# Patient Record
Sex: Female | Born: 1980 | Race: Black or African American | Hispanic: No | Marital: Married | Smoking: Never smoker
Health system: Southern US, Community
[De-identification: ages and names within clinical notes are randomized; demographics above are authoritative.]

## PROBLEM LIST (undated history)

## (undated) DIAGNOSIS — K219 Gastro-esophageal reflux disease without esophagitis: Secondary | ICD-10-CM

## (undated) DIAGNOSIS — F419 Anxiety disorder, unspecified: Secondary | ICD-10-CM

## (undated) DIAGNOSIS — G43109 Migraine with aura, not intractable, without status migrainosus: Secondary | ICD-10-CM

## (undated) DIAGNOSIS — N200 Calculus of kidney: Secondary | ICD-10-CM

## (undated) DIAGNOSIS — Z87442 Personal history of urinary calculi: Secondary | ICD-10-CM

## (undated) HISTORY — PX: DILATION AND CURETTAGE OF UTERUS: SHX78

## (undated) HISTORY — DX: Migraine with aura, not intractable, without status migrainosus: G43.109

---

## 2000-05-30 ENCOUNTER — Other Ambulatory Visit: Admission: RE | Admit: 2000-05-30 | Discharge: 2000-05-30 | Payer: Self-pay | Admitting: Obstetrics and Gynecology

## 2000-06-07 ENCOUNTER — Ambulatory Visit (HOSPITAL_COMMUNITY): Admission: RE | Admit: 2000-06-07 | Discharge: 2000-06-07 | Payer: Self-pay | Admitting: Internal Medicine

## 2000-09-11 ENCOUNTER — Emergency Department (HOSPITAL_COMMUNITY): Admission: EM | Admit: 2000-09-11 | Discharge: 2000-09-11 | Payer: Self-pay | Admitting: Emergency Medicine

## 2000-10-31 ENCOUNTER — Ambulatory Visit (HOSPITAL_COMMUNITY): Admission: RE | Admit: 2000-10-31 | Discharge: 2000-10-31 | Payer: Self-pay | Admitting: Obstetrics and Gynecology

## 2001-02-18 ENCOUNTER — Other Ambulatory Visit: Admission: RE | Admit: 2001-02-18 | Discharge: 2001-02-18 | Payer: Self-pay | Admitting: Obstetrics and Gynecology

## 2001-02-21 ENCOUNTER — Ambulatory Visit (HOSPITAL_COMMUNITY): Admission: RE | Admit: 2001-02-21 | Discharge: 2001-02-21 | Payer: Self-pay | Admitting: Obstetrics and Gynecology

## 2001-11-16 ENCOUNTER — Emergency Department (HOSPITAL_COMMUNITY): Admission: EM | Admit: 2001-11-16 | Discharge: 2001-11-16 | Payer: Self-pay | Admitting: Emergency Medicine

## 2002-04-18 ENCOUNTER — Emergency Department (HOSPITAL_COMMUNITY): Admission: EM | Admit: 2002-04-18 | Discharge: 2002-04-18 | Payer: Self-pay | Admitting: Emergency Medicine

## 2003-02-24 ENCOUNTER — Ambulatory Visit (HOSPITAL_COMMUNITY): Admission: AD | Admit: 2003-02-24 | Discharge: 2003-02-24 | Payer: Self-pay | Admitting: Obstetrics and Gynecology

## 2003-04-11 ENCOUNTER — Ambulatory Visit (HOSPITAL_COMMUNITY): Admission: RE | Admit: 2003-04-11 | Discharge: 2003-04-11 | Payer: Self-pay | Admitting: Obstetrics and Gynecology

## 2003-05-21 ENCOUNTER — Inpatient Hospital Stay (HOSPITAL_COMMUNITY): Admission: AD | Admit: 2003-05-21 | Discharge: 2003-05-24 | Payer: Self-pay | Admitting: Obstetrics and Gynecology

## 2003-09-07 ENCOUNTER — Emergency Department (HOSPITAL_COMMUNITY): Admission: EM | Admit: 2003-09-07 | Discharge: 2003-09-07 | Payer: Self-pay | Admitting: Emergency Medicine

## 2004-11-21 ENCOUNTER — Emergency Department (HOSPITAL_COMMUNITY): Admission: EM | Admit: 2004-11-21 | Discharge: 2004-11-21 | Payer: Self-pay | Admitting: Emergency Medicine

## 2005-07-21 ENCOUNTER — Ambulatory Visit (HOSPITAL_COMMUNITY): Admission: RE | Admit: 2005-07-21 | Discharge: 2005-07-21 | Payer: Self-pay | Admitting: Family Medicine

## 2005-07-21 IMAGING — CT CT HEAD W/O CM
1 series · 16 of 30 positions shown, 20 images · IV contrast (agent unspecified)
Comparison: none

HISTORY: Headaches

CT HEAD WITHOUT CONTRAST:
Routine noncontrast CT head without priors for comparison.
Normal ventricular morphology.
No midline shift or mass-effect.
Normal appearance of brain parenchyma.
No intracranial mass, hemorrhage, or infarction.
Visualized sinuses clear.
Bones unremarkable.

[Series 5169: — · axial · 0.49mm/px · z∈[-678,-542]mm · 16 of 30 slices shown, 20 images]
[im 2/30  brain]
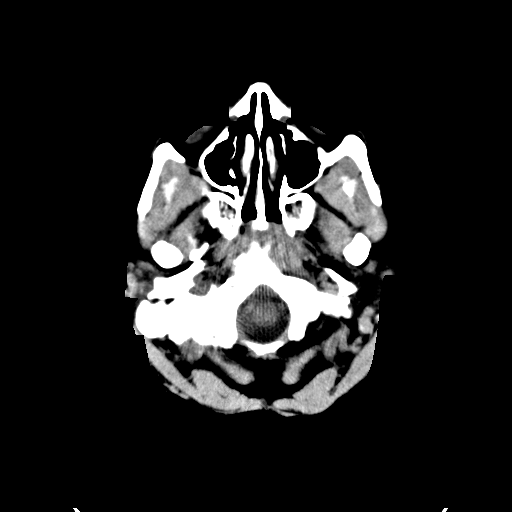
[im 2/30  bone]
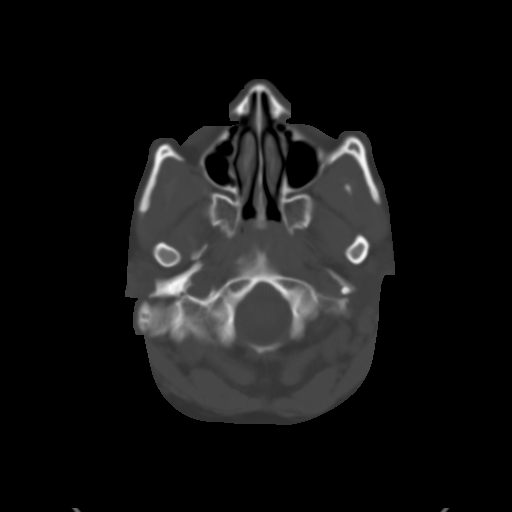
[im 4/30  brain]
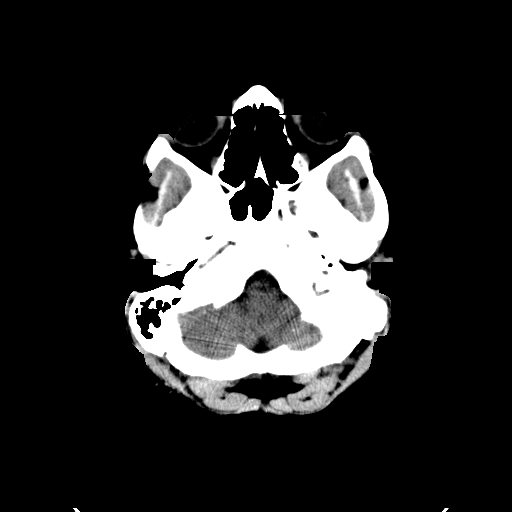
[im 6/30  brain]
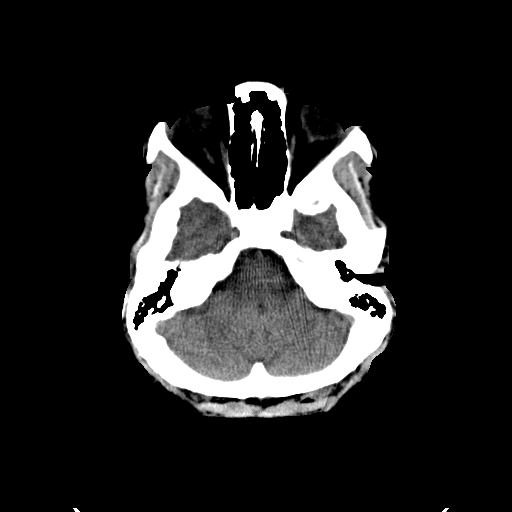
[im 8/30  brain]
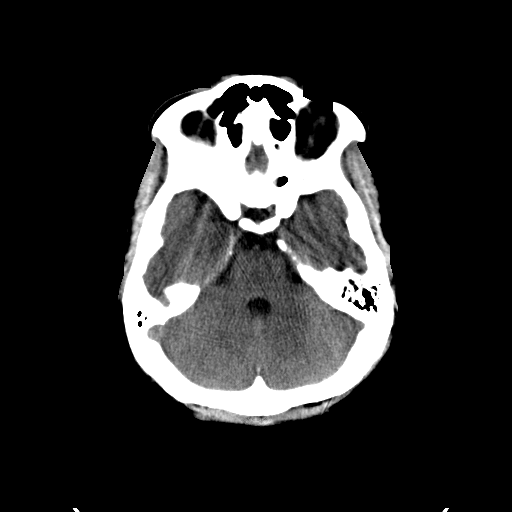
[im 9/30  brain]
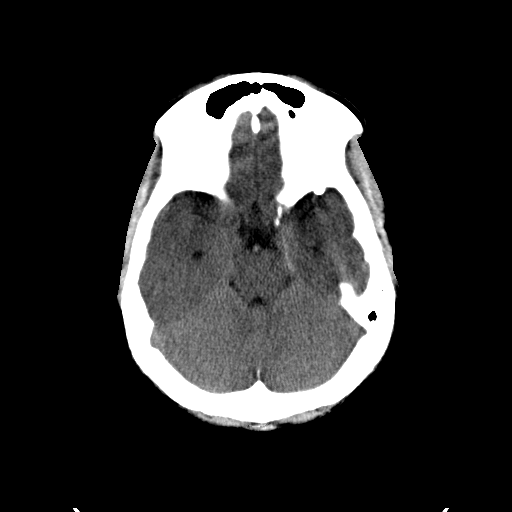
[im 9/30  bone]
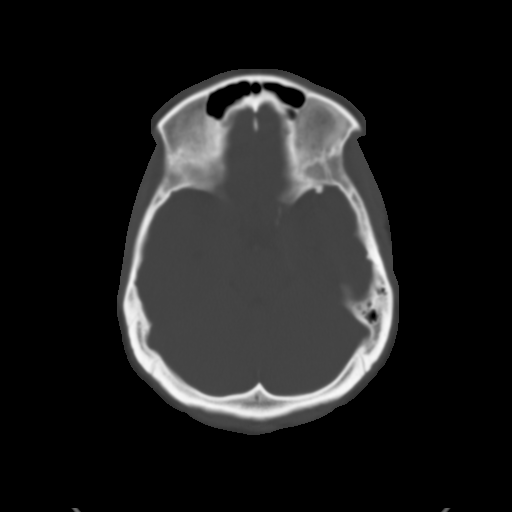
[im 11/30  brain]
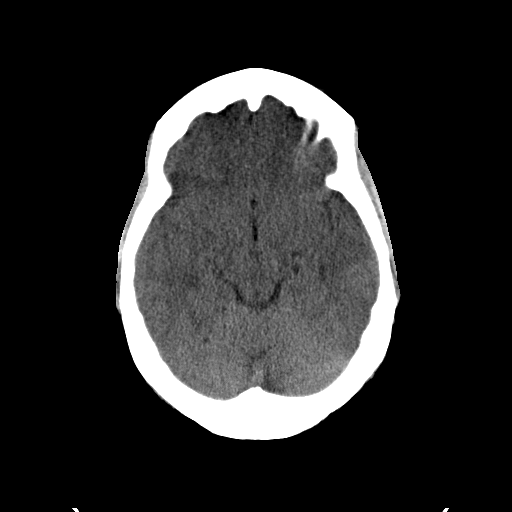
[im 13/30  brain]
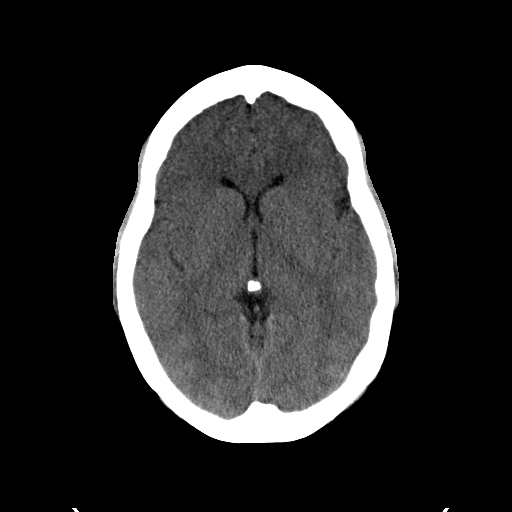
[im 15/30  brain]
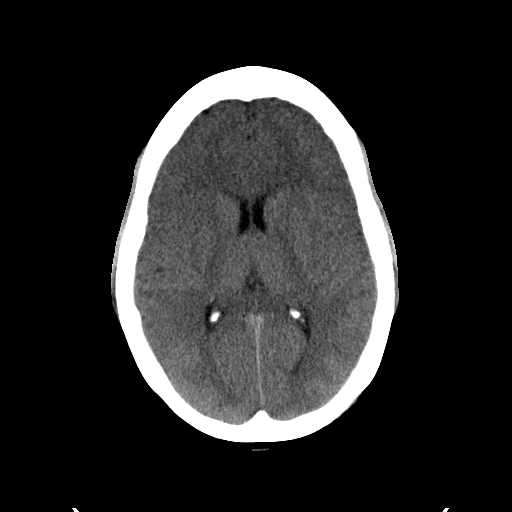
[im 16/30  brain]
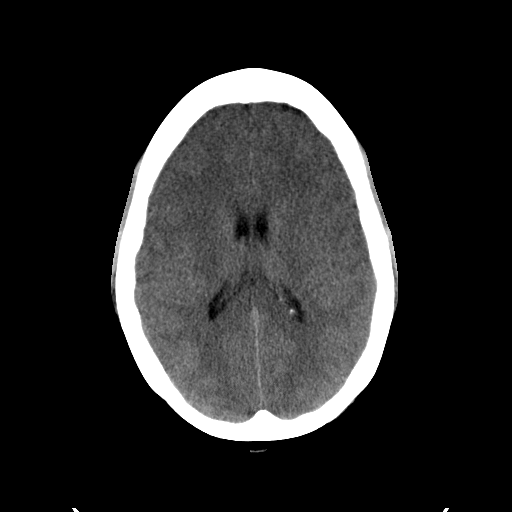
[im 16/30  bone]
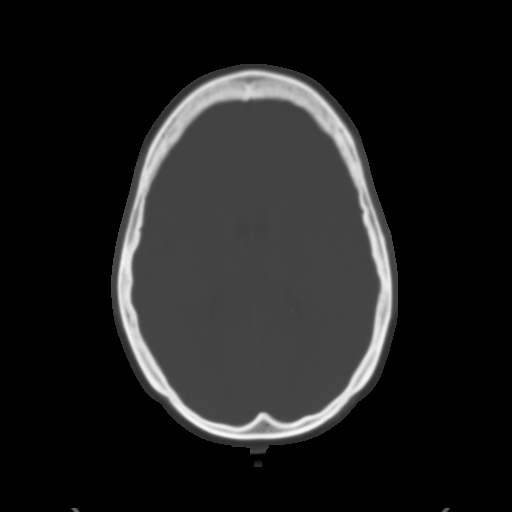
[im 18/30  brain]
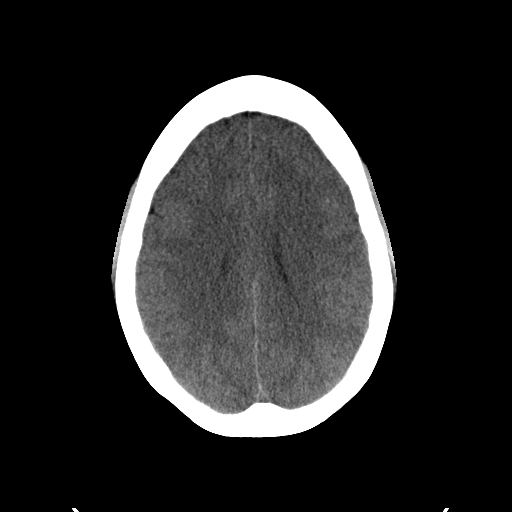
[im 20/30  brain]
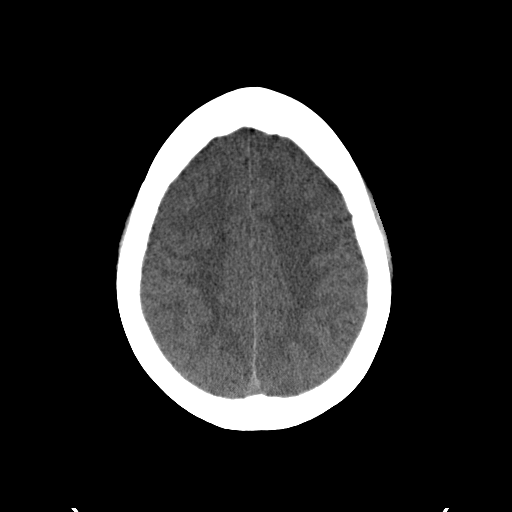
[im 22/30  brain]
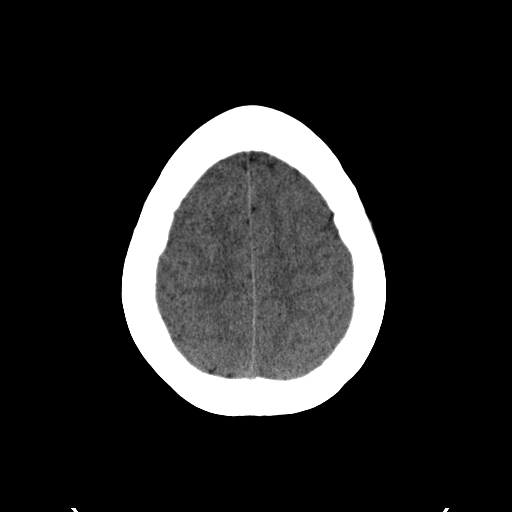
[im 23/30  brain]
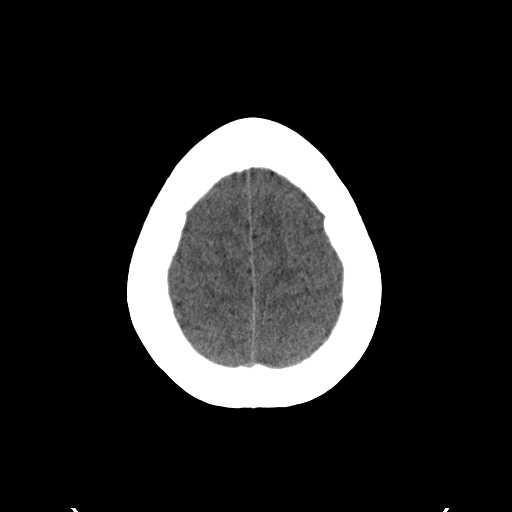
[im 23/30  bone]
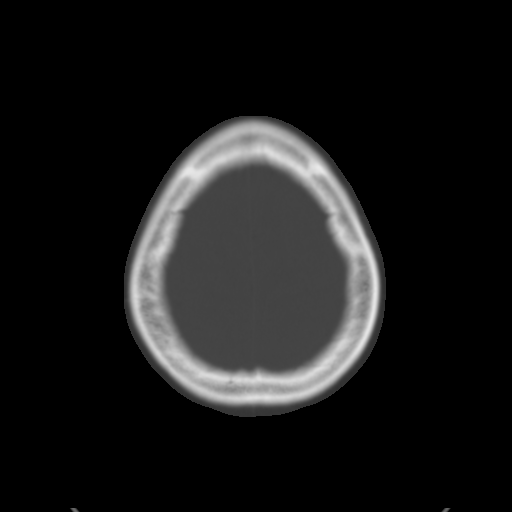
[im 25/30  brain]
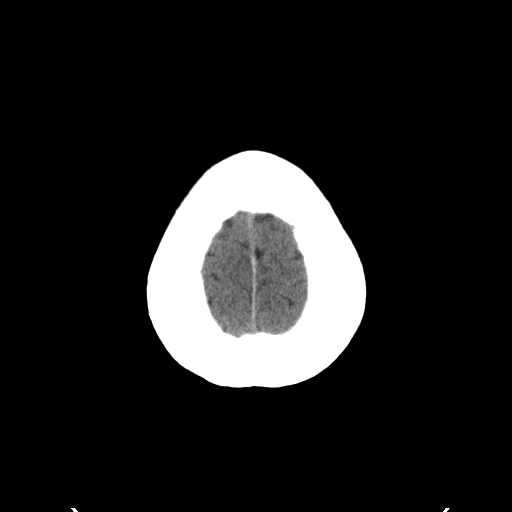
[im 27/30  brain]
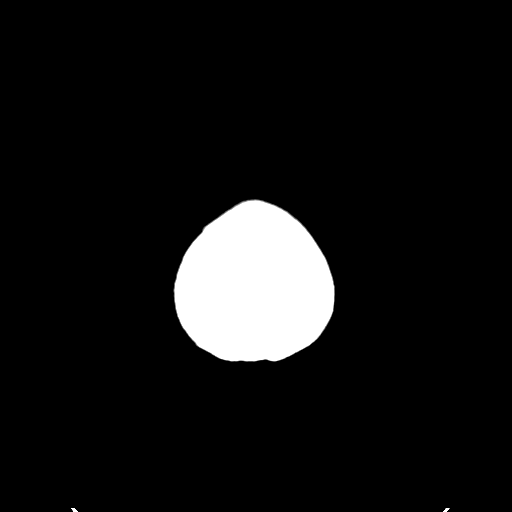
[im 29/30  brain]
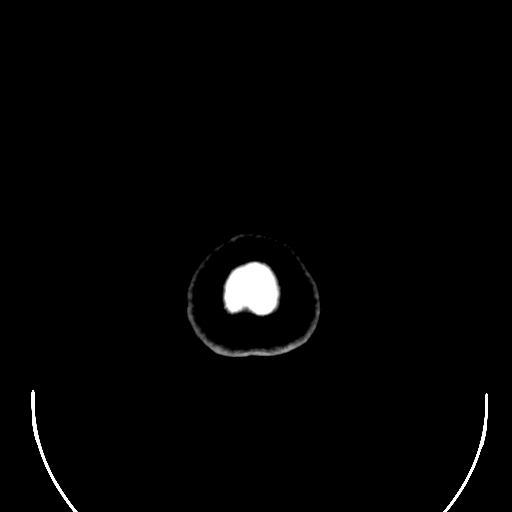

[16 of 30 positions shown; findings below may reference images not displayed]

IMPRESSION: No acute intracranial abnormality.

## 2005-07-22 ENCOUNTER — Emergency Department (HOSPITAL_COMMUNITY): Admission: EM | Admit: 2005-07-22 | Discharge: 2005-07-22 | Payer: Self-pay | Admitting: Emergency Medicine

## 2006-11-03 ENCOUNTER — Emergency Department (HOSPITAL_COMMUNITY): Admission: EM | Admit: 2006-11-03 | Discharge: 2006-11-03 | Payer: Self-pay | Admitting: Emergency Medicine

## 2006-11-05 ENCOUNTER — Other Ambulatory Visit: Admission: RE | Admit: 2006-11-05 | Discharge: 2006-11-05 | Payer: Self-pay | Admitting: Obstetrics and Gynecology

## 2006-12-12 ENCOUNTER — Emergency Department (HOSPITAL_COMMUNITY): Admission: EM | Admit: 2006-12-12 | Discharge: 2006-12-12 | Payer: Self-pay | Admitting: *Deleted

## 2008-01-08 ENCOUNTER — Emergency Department (HOSPITAL_COMMUNITY): Admission: EM | Admit: 2008-01-08 | Discharge: 2008-01-08 | Payer: Self-pay | Admitting: Emergency Medicine

## 2008-01-08 IMAGING — CT CT PELVIS W/O CM
1 of 2 series · 15 of 32 positions shown, 19 images · non-contrast
Comparison: None

CT ABDOMEN

CLINICAL DATA: Left flank pain with nausea and vomiting.

CT ABDOMEN AND PELVIS WITHOUT CONTRAST
TECHNIQUE: Multidetector CT imaging of the abdomen and pelvis was
performed following the standard protocol without intravenous
contrast.

[Series 2: stone 5.0 b40f · axial · 0.66mm/px · z∈[-493,-28]mm · 15 of 101 slices shown, 19 images]
[im 4/101  soft-tissue]
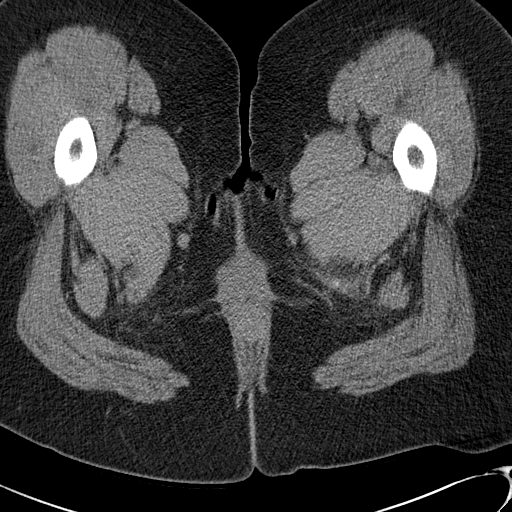
[im 4/101  bone]
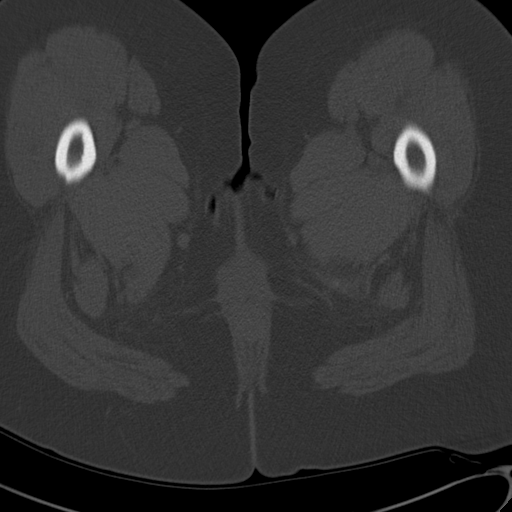
[im 12/101  soft-tissue]
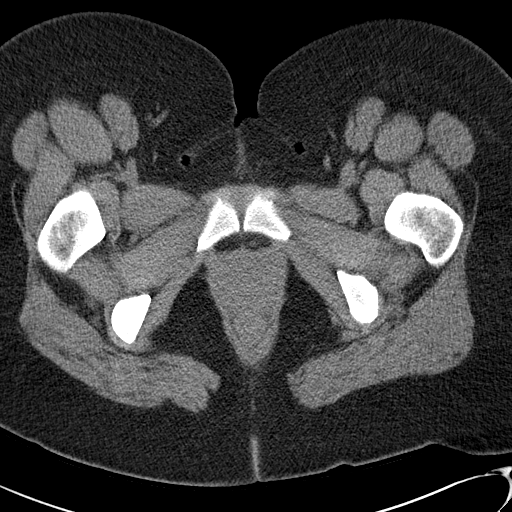
[im 20/101  soft-tissue]
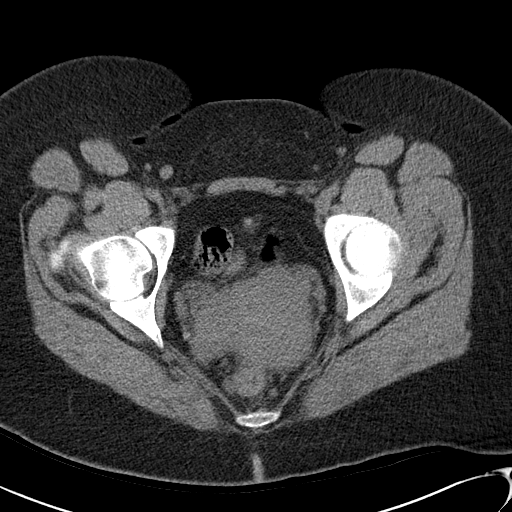
[im 27/101  soft-tissue]
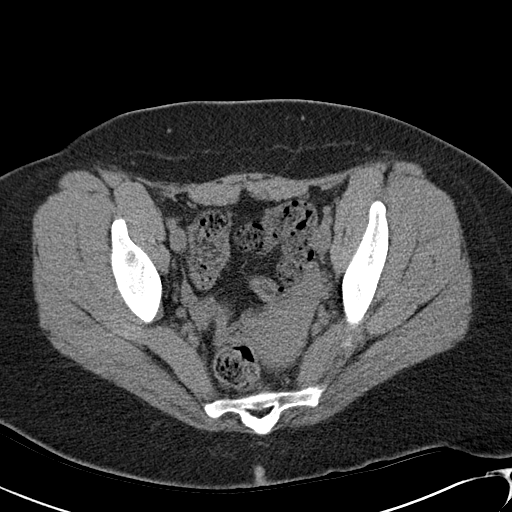
[im 35/101  soft-tissue]
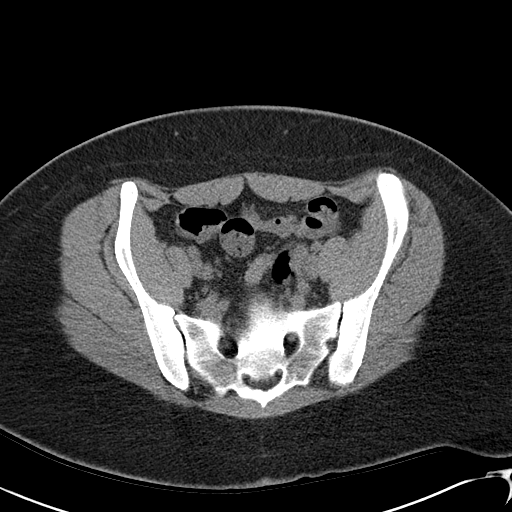
[im 43/101  soft-tissue]
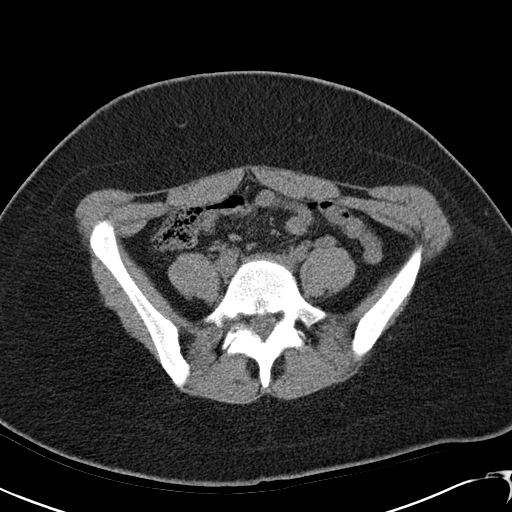
[im 51/101  soft-tissue]
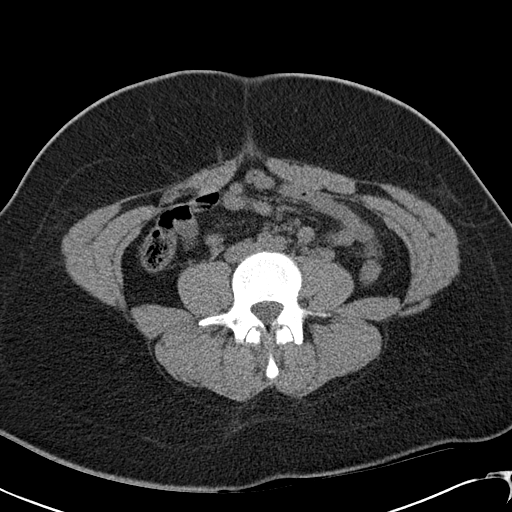
[im 58/101  soft-tissue]
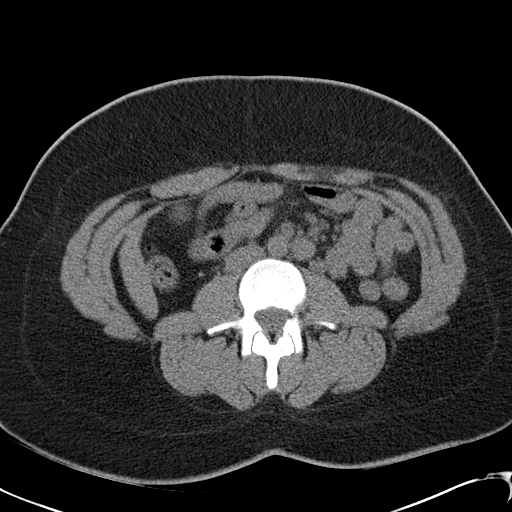
[im 66/101  soft-tissue]
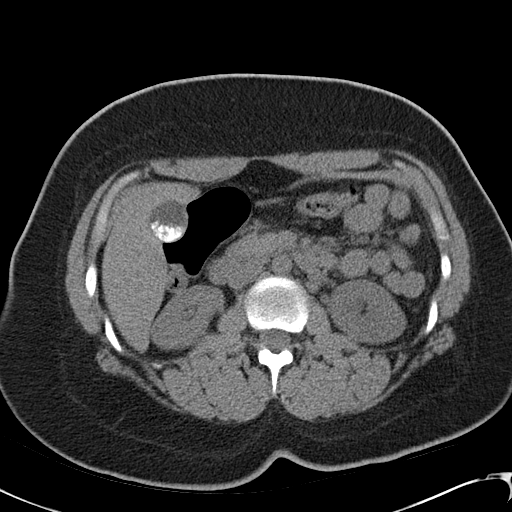
[im 66/101  bone]
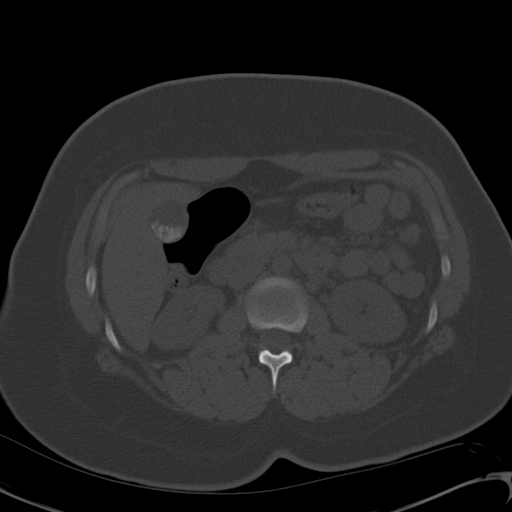
[im 74/101  soft-tissue]
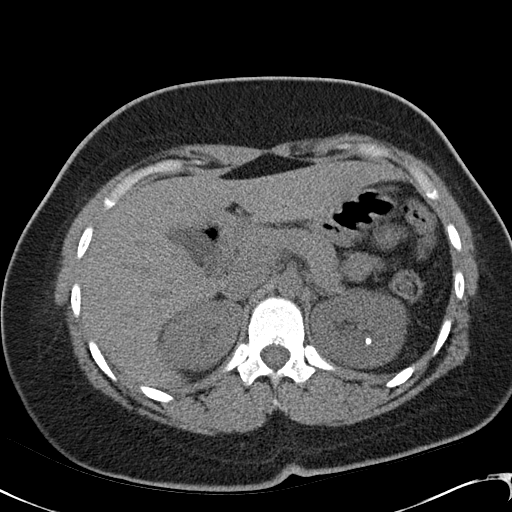
[im 81/101  soft-tissue]
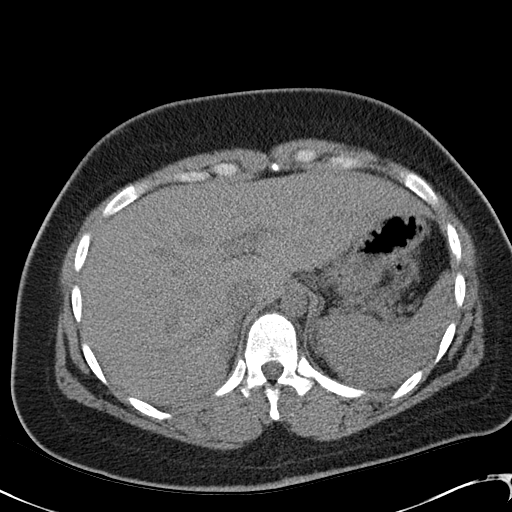
[im 85/101  lung]
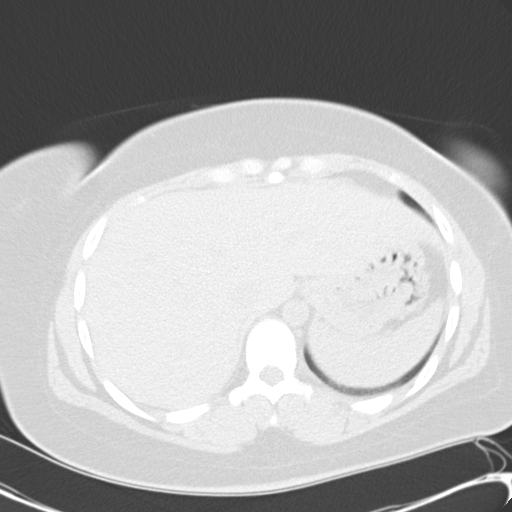
[im 89/101  soft-tissue]
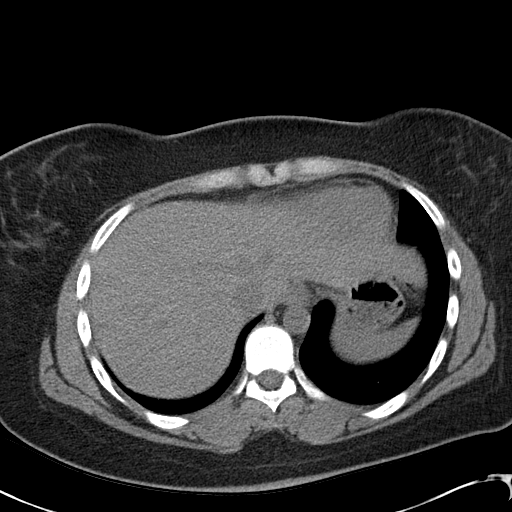
[im 89/101  lung]
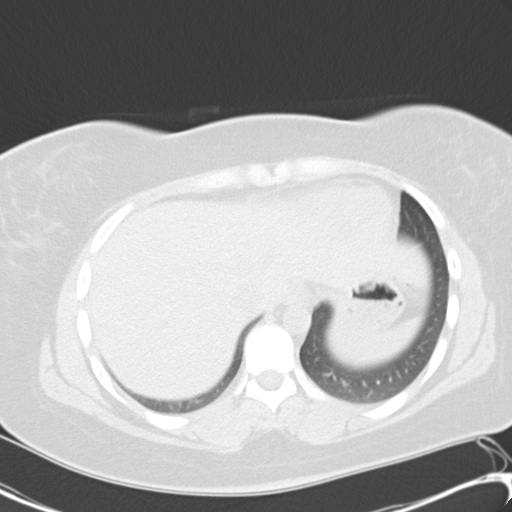
[im 93/101  lung]
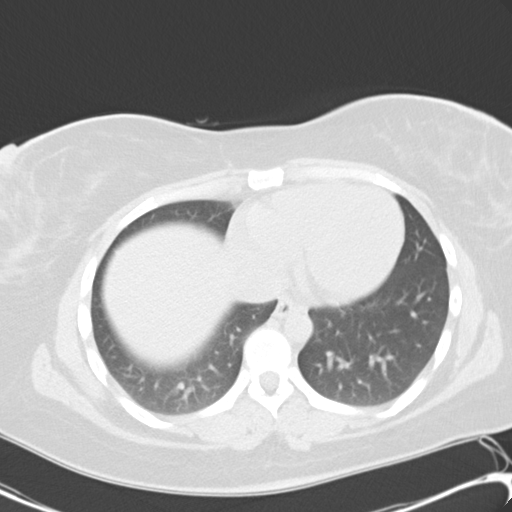
[im 97/101  soft-tissue]
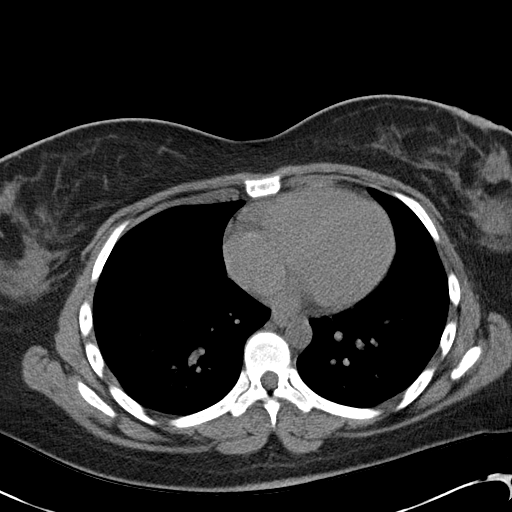
[im 97/101  lung]
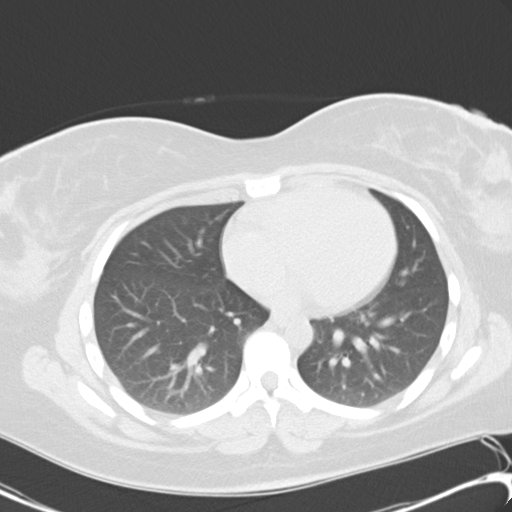

[15 of 32 positions shown; findings below may reference images not displayed]

FINDINGS: Lung bases are clear.  Heart size normal.  No
pericardial or pleural effusion.

Liver unremarkable.  Several stones layer in the gallbladder.
Adrenal glands, right kidney and right ureter are unremarkable.  A
stone in the upper pole of the left kidney measures 4 mm.  Left
ureter is mildly prominent, but a ureteral stone is not identified.
Very mild left perinephric stranding.  Spleen, pancreas, stomach
and small bowel are unremarkable.  No pathologically enlarged lymph
nodes.
IMPRESSION: 1.  Mild left perinephric stranding and left ureteral prominence
suggest the possibility of recent stone passage.
2.  Left nephrolithiasis.
3.  Cholelithiasis.

CT PELVIS
FINDINGS: A tiny calcification in the lateral left anatomic pelvis
is felt to be a phlebolith.  Colon and appendix are unremarkable.
Bladder is relatively decompressed.  No pathologically enlarged
lymph nodes.  No free fluid.  Uterus and ovaries are visualized.
No worrisome lytic or sclerotic lesions.
IMPRESSION: No acute findings in the pelvis.

## 2008-01-26 ENCOUNTER — Emergency Department (HOSPITAL_COMMUNITY): Admission: EM | Admit: 2008-01-26 | Discharge: 2008-01-26 | Payer: Self-pay | Admitting: Emergency Medicine

## 2008-01-28 ENCOUNTER — Other Ambulatory Visit: Admission: RE | Admit: 2008-01-28 | Discharge: 2008-01-28 | Payer: Self-pay | Admitting: Obstetrics & Gynecology

## 2009-03-05 ENCOUNTER — Emergency Department (HOSPITAL_COMMUNITY): Admission: EM | Admit: 2009-03-05 | Discharge: 2009-03-05 | Payer: Self-pay | Admitting: Emergency Medicine

## 2009-03-05 IMAGING — US US OB COMP LESS 14 WK
1 series · 14 of 28 positions shown · non-contrast
Comparison: None.

CLINICAL DATA: 28-year-old with LMP approximately [DATE] (9
weeks 6 days), presenting with pelvic pain. Quantitative beta HCG
[DATE].

OBSTETRIC <14 WK ULTRASOUND [DATE]:
TECHNIQUE: Transabdominal ultrasound was performed for evaluation
of the gestation as well as the maternal uterus and adnexal
regions.

[Series 1: us ob comp less 14 wk · 0.30mm/px · 14 of 43 slices shown]
[im 2/43]
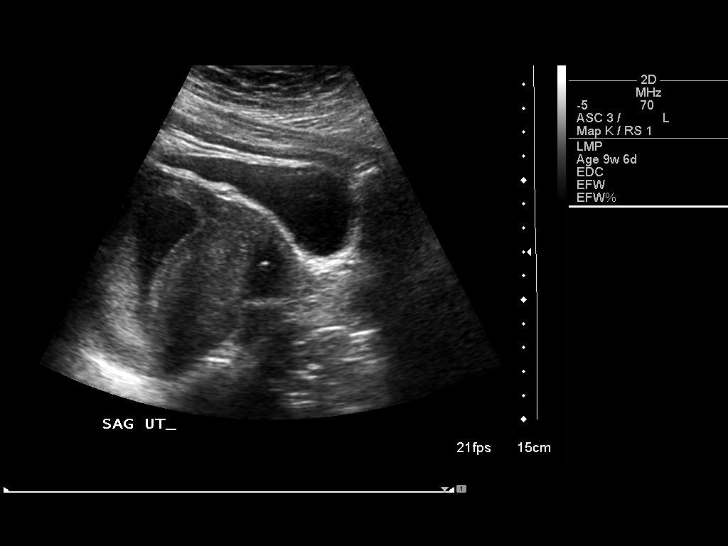
[im 5/43]
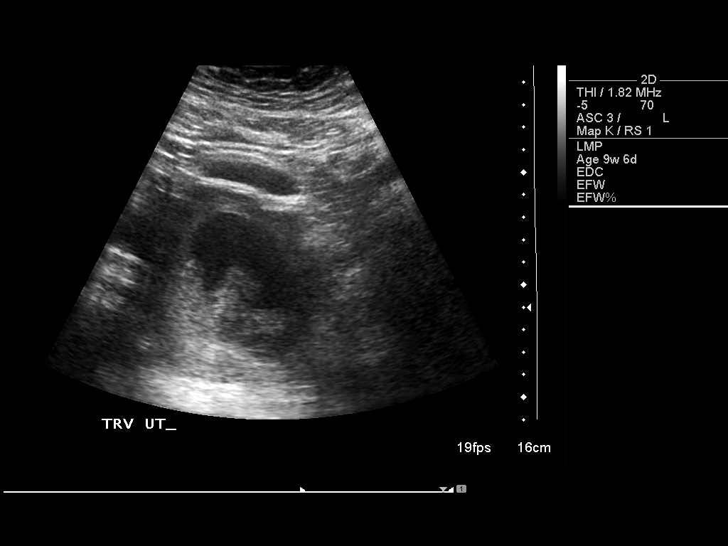
[im 8/43]
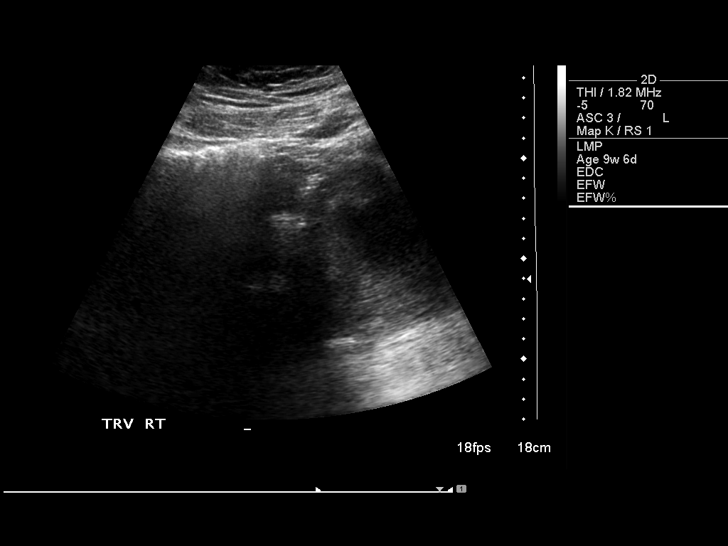
[im 11/43]
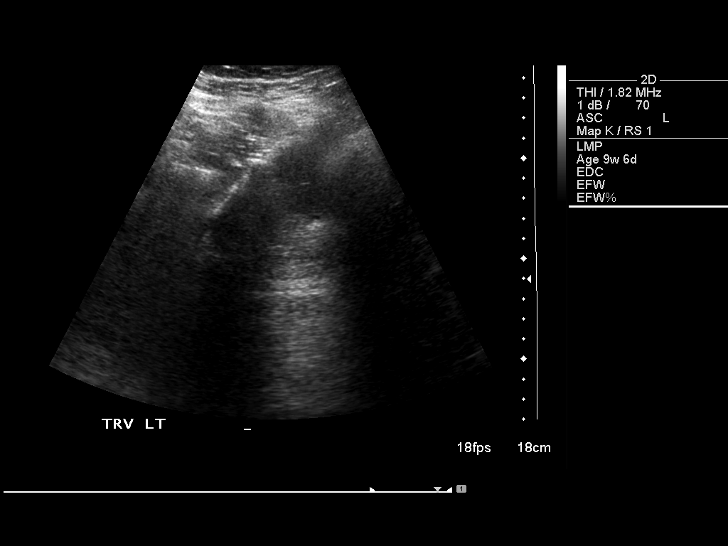
[im 15/43]
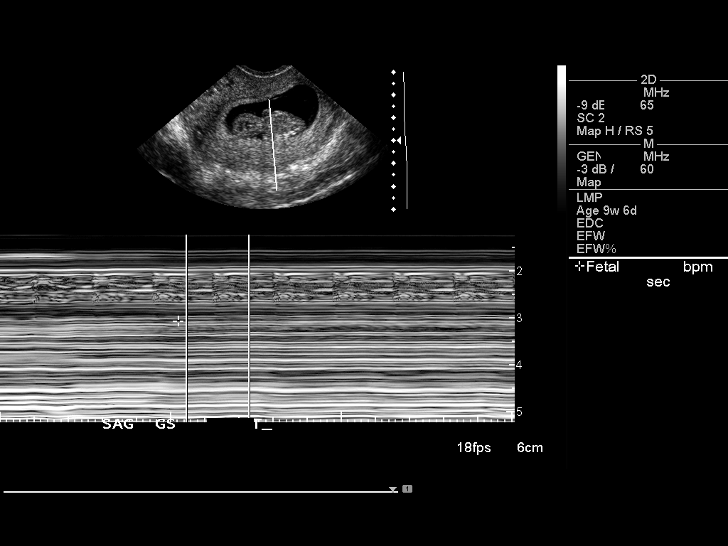
[im 18/43]
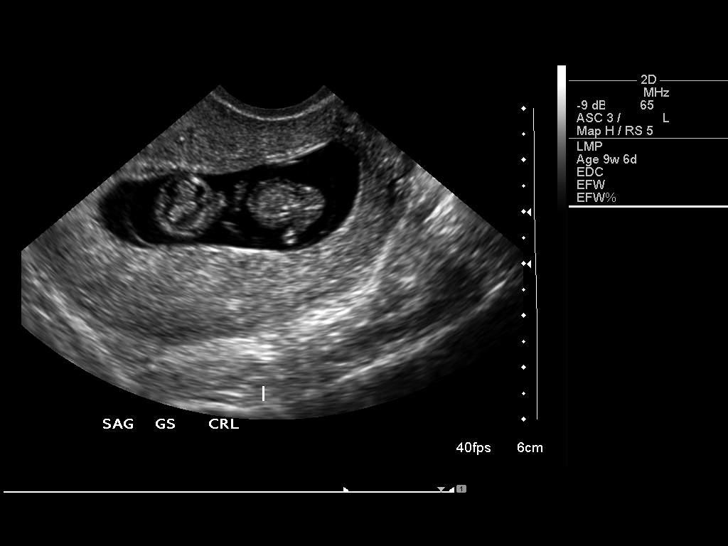
[im 21/43]
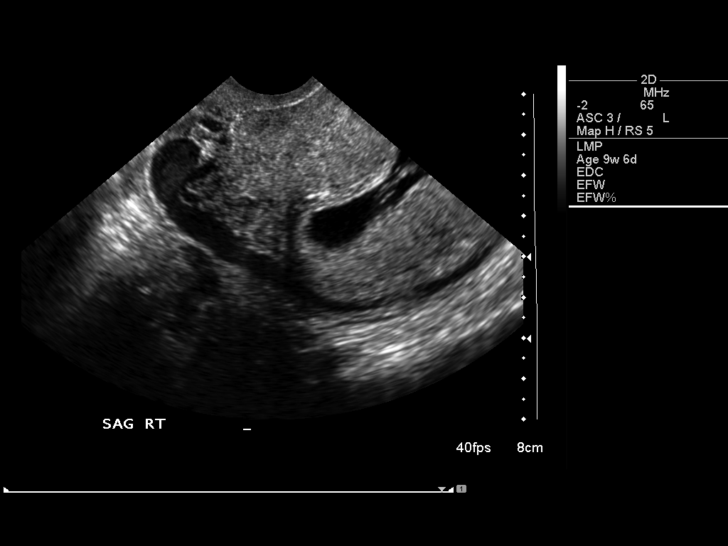
[im 24/43]
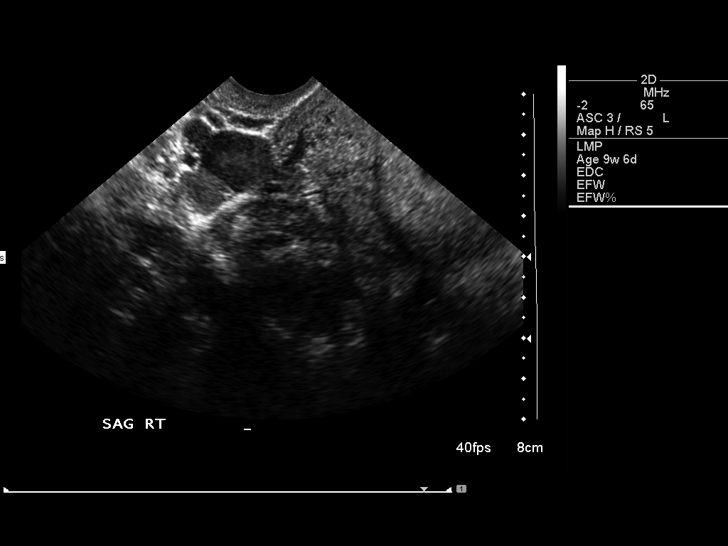
[im 27/43]
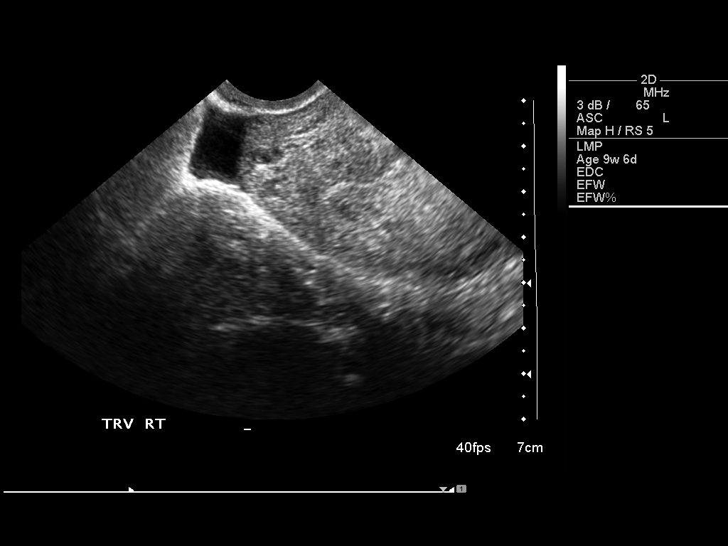
[im 30/43]
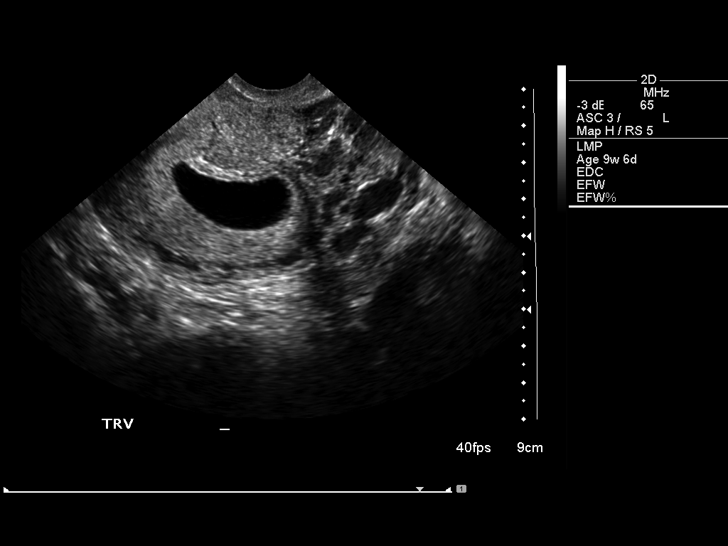
[im 33/43]
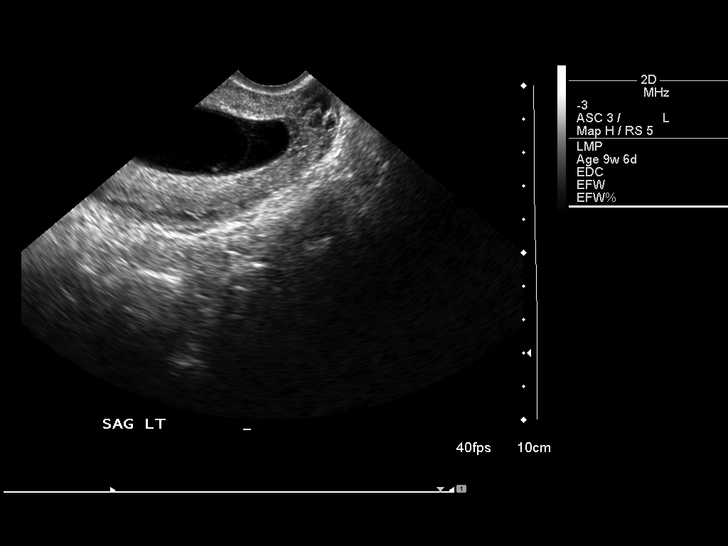
[im 36/43]
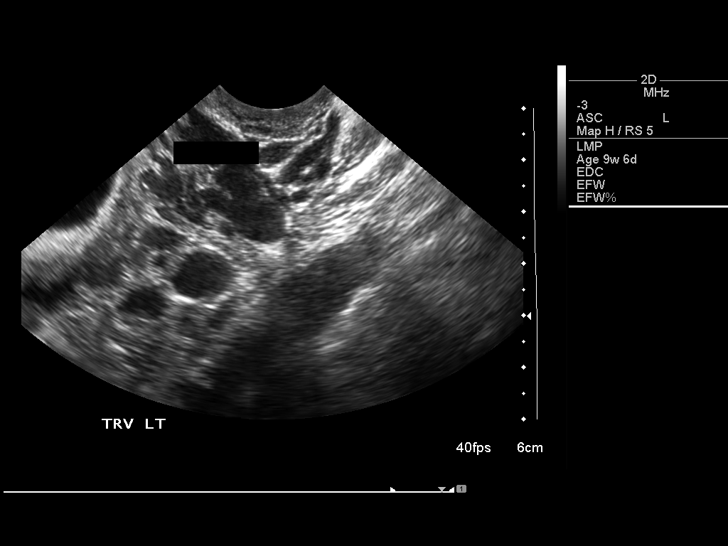
[im 39/43]
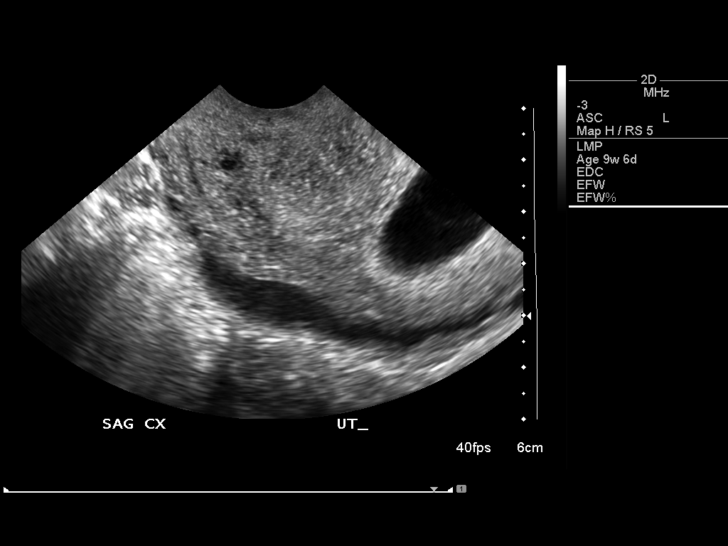
[im 43/43]
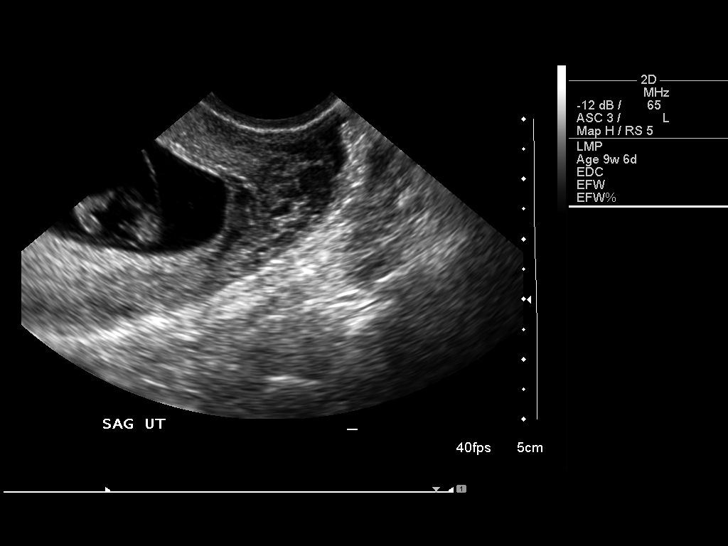

[14 of 28 positions shown; findings below may reference images not displayed]

Intrauterine gestational sac: Single.
Yolk sac: Not still present.
Fetus:  Present.
Cardiac Activity: Visualized.
Heart Rate: 163 bpm

CRL:  34 mm         10w  2d         US EDC: [DATE]

Maternal uterus/Adnexae:
Small subchorionic hemorrhage.  Retroflexed uterus.  Prominent
vessels in the left adnexa.  Small moderate amount of free fluid in
the right adnexa. Neither ovary visualized.
IMPRESSION: 1.  Single live intrauterine fetus with estimated gestational age
10 weeks 2 days by crown-rump length, corresponding well with the
gestational age by LMP of 9 weeks 6 days.  Ultrasound EDC
[DATE].
2.  Small subchorionic hemorrhage.
3.  Small to moderate free fluid in the right adnexa. Query recent
cyst rupture.
4.  Nonvisualization of the ovaries. No adnexal masses.
5.  Prominent vessels in the left adnexa, query varicocele.

## 2009-03-24 ENCOUNTER — Other Ambulatory Visit: Admission: RE | Admit: 2009-03-24 | Discharge: 2009-03-24 | Payer: Self-pay | Admitting: Obstetrics and Gynecology

## 2009-05-03 ENCOUNTER — Ambulatory Visit (HOSPITAL_COMMUNITY): Admission: RE | Admit: 2009-05-03 | Discharge: 2009-05-03 | Payer: Self-pay | Admitting: Obstetrics & Gynecology

## 2009-05-03 IMAGING — US US OB DETAIL+14 WK
1 series · 14 of 28 positions shown · non-contrast
Comparison: none

OBSTETRICAL ULTRASOUND:
 This ultrasound was performed in The [HOSPITAL], and the [facility] report will be stored to [REDACTED] PACS.  This report is also available in [HOSPITAL]?s accessANYware.

[Series 1: us ob detail+14 wk · 14 of 113 slices shown]
[im 5/113]
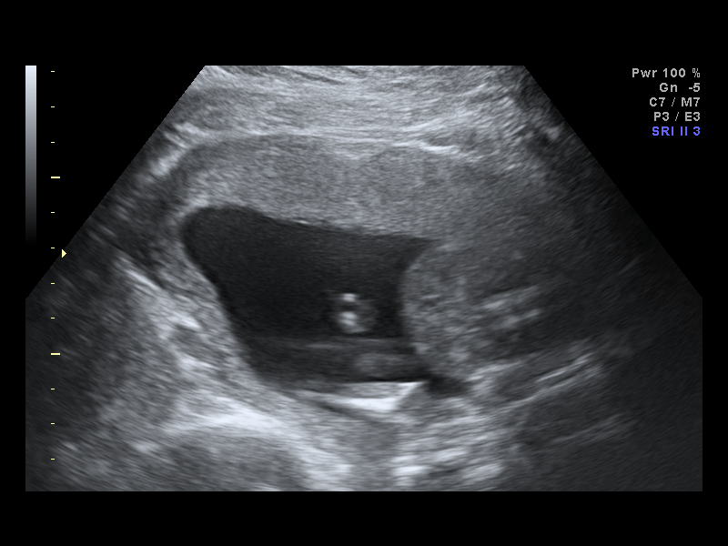
[im 13/113]
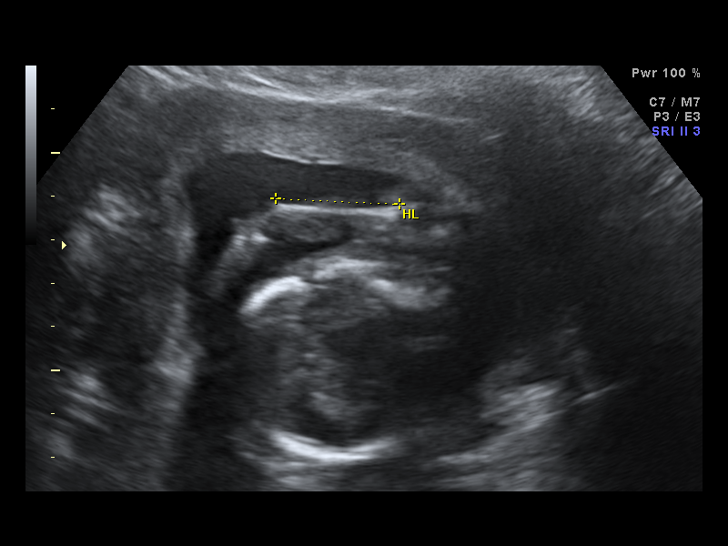
[im 21/113]
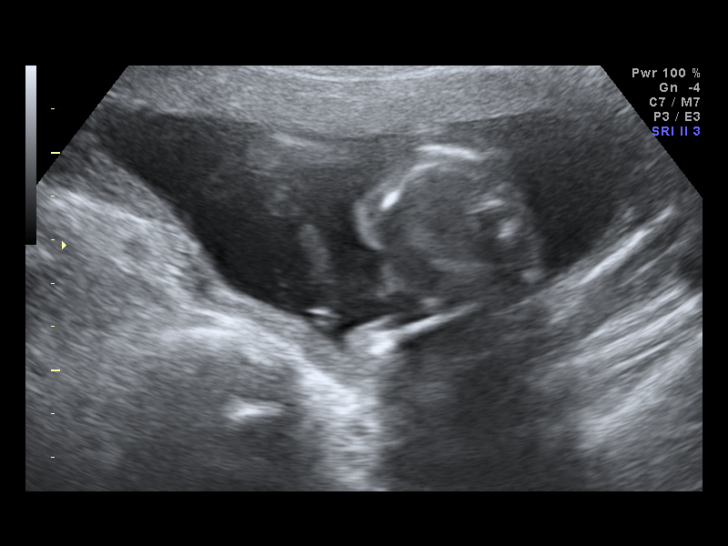
[im 30/113]
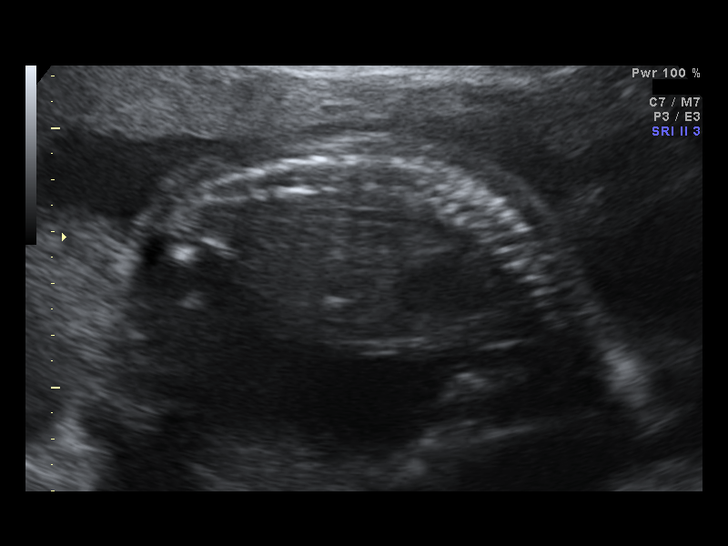
[im 38/113]
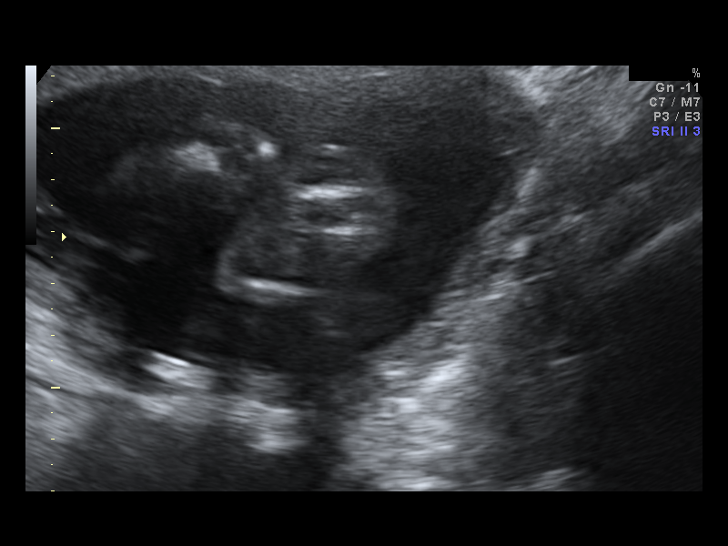
[im 46/113]
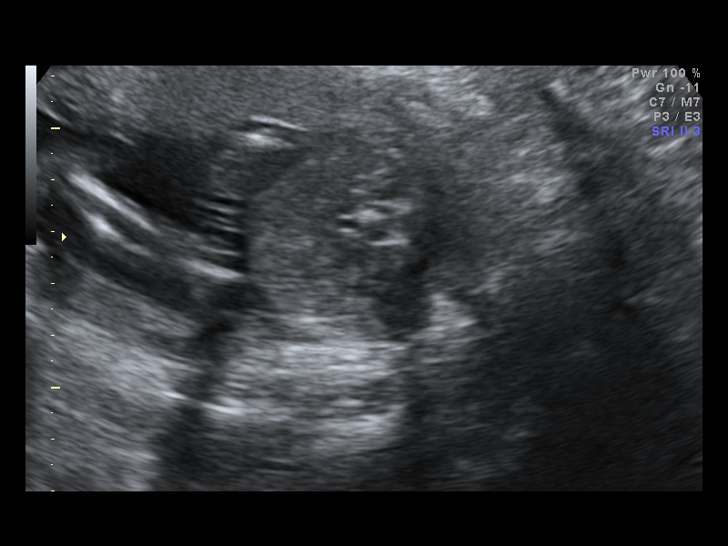
[im 54/113]
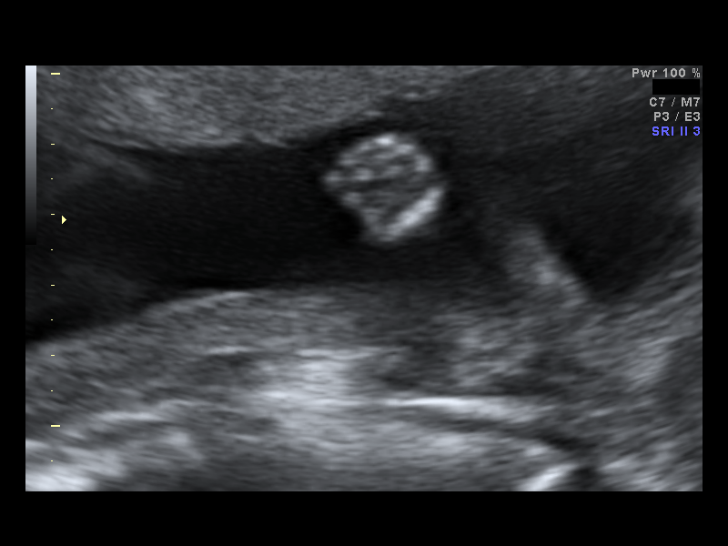
[im 63/113]
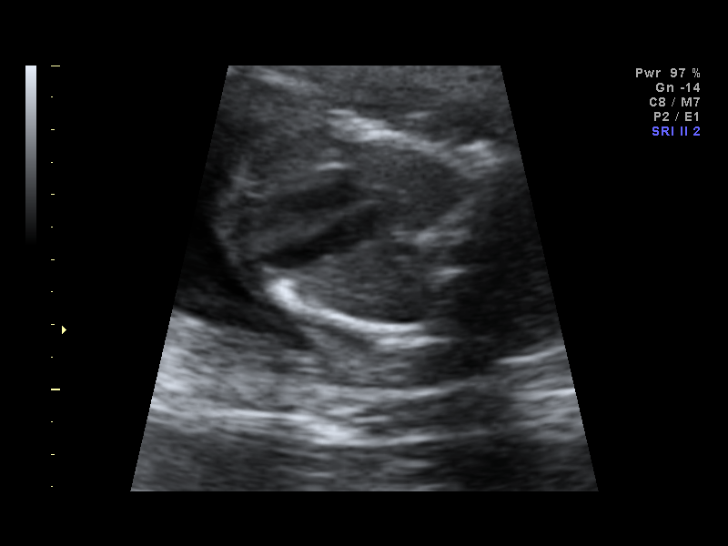
[im 71/113]
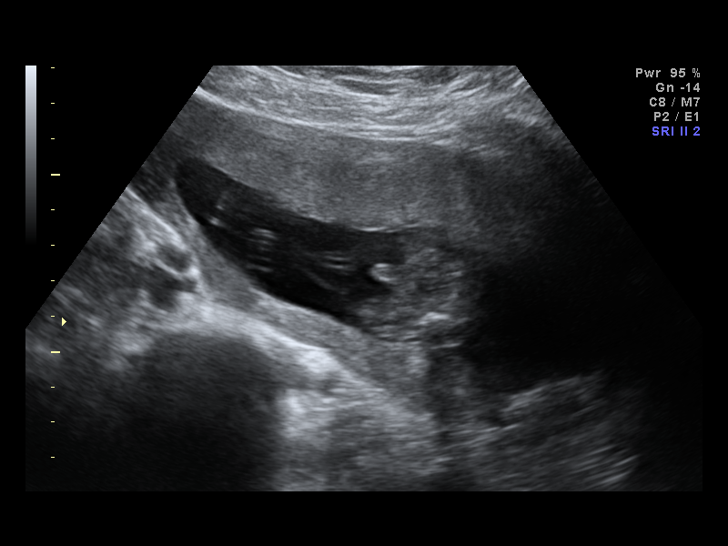
[im 79/113]
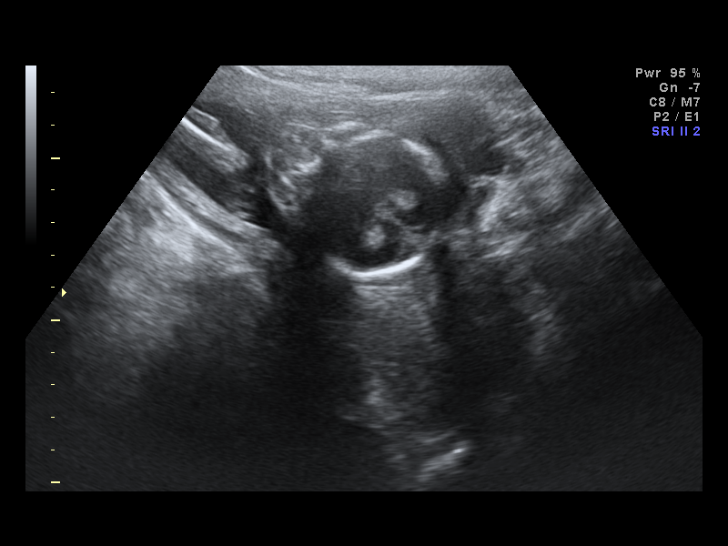
[im 88/113]
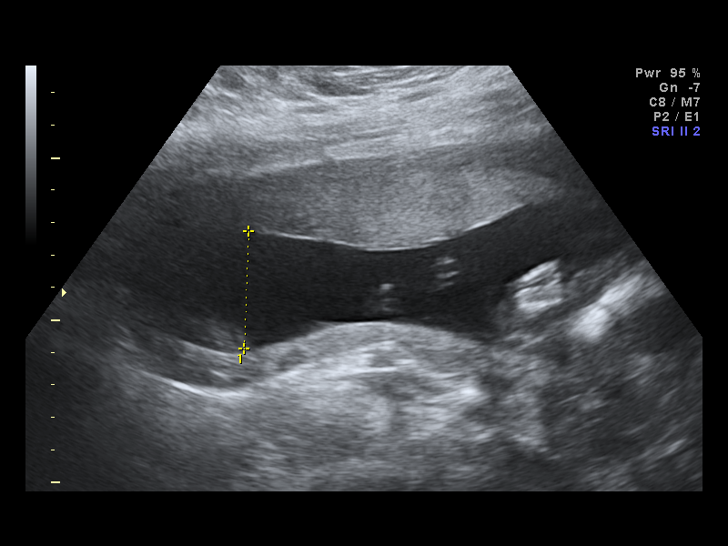
[im 96/113]
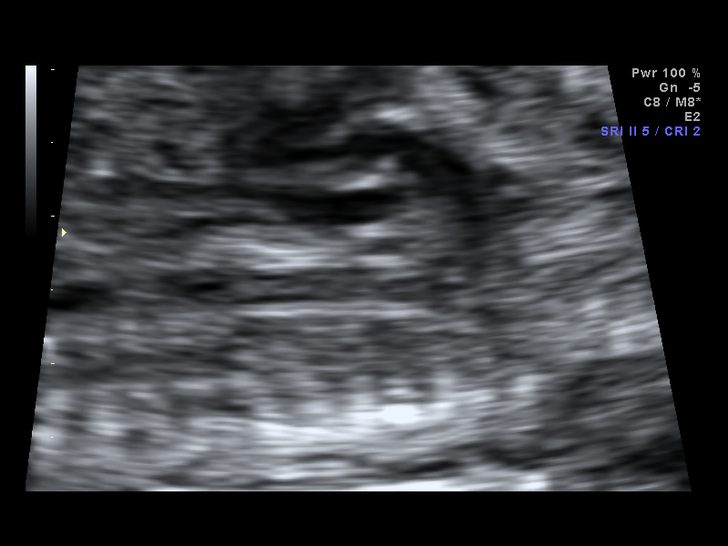
[im 104/113]
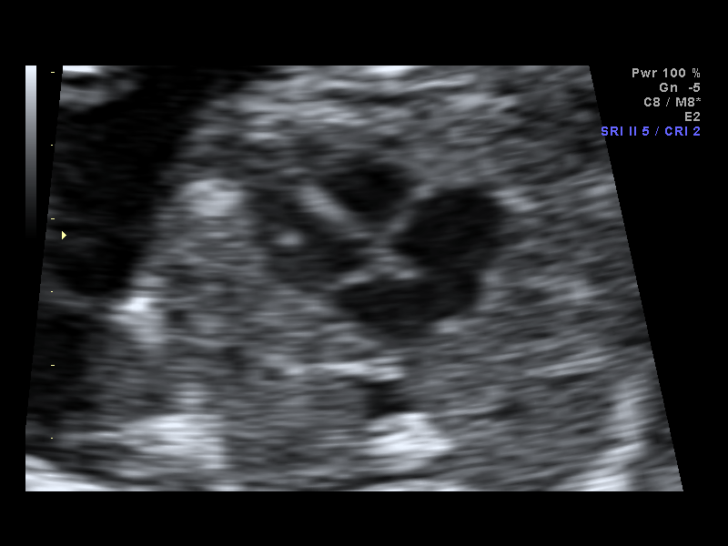
[im 113/113]
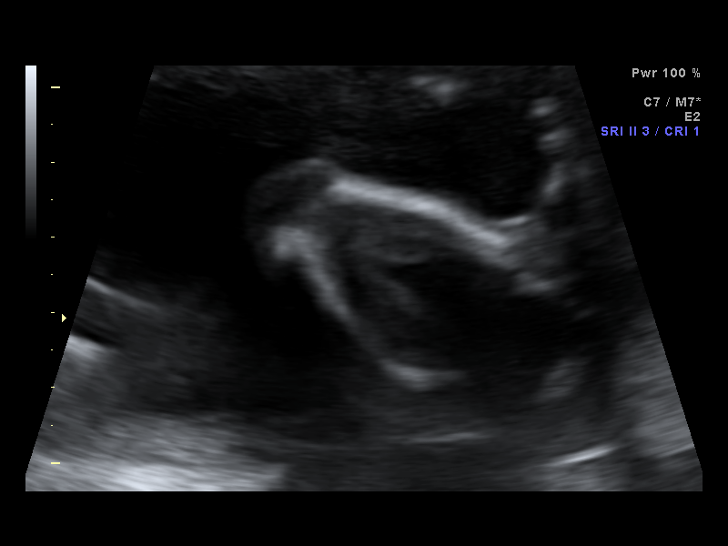

[14 of 28 positions shown; findings below may reference images not displayed]

IMPRESSION: [facility] has also been faxed to the ordering physician.

## 2009-09-22 ENCOUNTER — Inpatient Hospital Stay (HOSPITAL_COMMUNITY): Admission: AD | Admit: 2009-09-22 | Discharge: 2009-09-24 | Payer: Self-pay | Admitting: Obstetrics & Gynecology

## 2009-09-22 ENCOUNTER — Ambulatory Visit: Payer: Self-pay | Admitting: Advanced Practice Midwife

## 2010-04-22 LAB — CBC
HCT: 37.8 % (ref 36.0–46.0)
Hemoglobin: 12.6 g/dL (ref 12.0–15.0)
MCH: 29.4 pg (ref 26.0–34.0)
MCHC: 33.3 g/dL (ref 30.0–36.0)
MCV: 88.3 fL (ref 78.0–100.0)
Platelets: 145 10*3/uL — ABNORMAL LOW (ref 150–400)
RBC: 4.28 MIL/uL (ref 3.87–5.11)
RDW: 15.7 % — ABNORMAL HIGH (ref 11.5–15.5)
WBC: 7.1 10*3/uL (ref 4.0–10.5)

## 2010-04-22 LAB — RPR: RPR Ser Ql: NONREACTIVE

## 2010-04-24 LAB — URINALYSIS, ROUTINE W REFLEX MICROSCOPIC
Bilirubin Urine: NEGATIVE
Bilirubin Urine: NEGATIVE
Glucose, UA: NEGATIVE mg/dL
Glucose, UA: NEGATIVE mg/dL
Ketones, ur: 40 mg/dL — AB
Leukocytes, UA: NEGATIVE
Nitrite: NEGATIVE
Nitrite: NEGATIVE
Protein, ur: NEGATIVE mg/dL
Protein, ur: NEGATIVE mg/dL
Specific Gravity, Urine: 1.01 (ref 1.005–1.030)
Specific Gravity, Urine: 1.015 (ref 1.005–1.030)
Urobilinogen, UA: 0.2 mg/dL (ref 0.0–1.0)
Urobilinogen, UA: 0.2 mg/dL (ref 0.0–1.0)
pH: 6.5 (ref 5.0–8.0)
pH: 7 (ref 5.0–8.0)

## 2010-04-24 LAB — URINE MICROSCOPIC-ADD ON

## 2010-04-24 LAB — DIFFERENTIAL
Basophils Absolute: 0 10*3/uL (ref 0.0–0.1)
Basophils Relative: 0 % (ref 0–1)
Eosinophils Absolute: 0 10*3/uL (ref 0.0–0.7)
Eosinophils Relative: 0 % (ref 0–5)
Lymphocytes Relative: 34 % (ref 12–46)
Lymphs Abs: 2 10*3/uL (ref 0.7–4.0)
Monocytes Absolute: 0.3 10*3/uL (ref 0.1–1.0)
Monocytes Relative: 6 % (ref 3–12)
Neutro Abs: 3.5 10*3/uL (ref 1.7–7.7)
Neutrophils Relative %: 60 % (ref 43–77)

## 2010-04-24 LAB — BASIC METABOLIC PANEL
BUN: 5 mg/dL — ABNORMAL LOW (ref 6–23)
CO2: 27 mEq/L (ref 19–32)
Calcium: 10.7 mg/dL — ABNORMAL HIGH (ref 8.4–10.5)
Chloride: 104 mEq/L (ref 96–112)
Creatinine, Ser: 0.73 mg/dL (ref 0.4–1.2)
GFR calc Af Amer: >60 mL/min (ref >60)
GFR calc non Af Amer: >60 mL/min (ref >60)
Glucose, Bld: 84 mg/dL (ref 70–99)
Potassium: 3.7 mEq/L (ref 3.5–5.1)
Sodium: 136 mEq/L (ref 135–145)

## 2010-04-24 LAB — CBC
HCT: 39.6 % (ref 36.0–46.0)
Hemoglobin: 13.2 g/dL (ref 12.0–15.0)
MCHC: 33.3 g/dL (ref 30.0–36.0)
MCV: 86.2 fL (ref 78.0–100.0)
Platelets: 182 10*3/uL (ref 150–400)
RBC: 4.59 MIL/uL (ref 3.87–5.11)
RDW: 13.6 % (ref 11.5–15.5)
WBC: 5.8 10*3/uL (ref 4.0–10.5)

## 2010-04-24 LAB — URINE CULTURE
Colony Count: NO GROWTH
Culture: NO GROWTH

## 2010-04-24 LAB — RPR: RPR Ser Ql: NONREACTIVE

## 2010-04-24 LAB — WET PREP, GENITAL
Trich, Wet Prep: NONE SEEN
Yeast Wet Prep HPF POC: NONE SEEN

## 2010-04-24 LAB — GC/CHLAMYDIA PROBE AMP, GENITAL
Chlamydia, DNA Probe: NEGATIVE
GC Probe Amp, Genital: NEGATIVE

## 2010-04-24 LAB — HCG, QUANTITATIVE, PREGNANCY: hCG, Beta Chain, Quant, S: 116193 m[IU]/mL — ABNORMAL HIGH (ref <5)

## 2010-05-08 DIAGNOSIS — G43109 Migraine with aura, not intractable, without status migrainosus: Secondary | ICD-10-CM

## 2010-05-08 HISTORY — DX: Migraine with aura, not intractable, without status migrainosus: G43.109

## 2010-06-24 NOTE — Op Note (Signed)
[facility]  Patient:    Brandi Dunn, Brandi Dunn Visit Number: [id number] MRN: [id number]          Service Type: DSU Location: DAY Attending Physician:  Tilda Burrow Dictated by:   Christin Bach, M.D. Proc. Date: 02/21/01 Admit Date:  02/21/2001 Discharge Date: 02/21/2001                             Operative Report  PREOPERATIVE DIAGNOSES:  Missed abortion 11 weeks.  POSTOPERATIVE DIAGNOSES:  Missed abortion 11 weeks.  PROCEDURE:  Suction dilatation and curettage.  SURGEON:  Dr. Christin Bach  ASSISTANT:  None.  ANESTHESIA:  Paracervical block with IV sedation.  COMPLICATIONS:  None.  DETAILS OF PROCEDURE:  Patient was taken to the operating room, prepped and draped for suction D&C.  The patients blood type was confirmed as O+.  The patient had cervix grasped with single tooth tenaculum and paracervical block applied around the cervix and the cervix dilated to 27 [place] allowing introduction of a 9 mm curved suction curette which was used in a circumferential fashion to remove blood, tissue, and fluid consistent with missed AB.  Smooth, sharp curettage confirmed satisfactory uterine evacuation. Patient then was allowed to go to recovery room in good condition. Dictated by:   Christin Bach, M.D. Attending Physician:  Tilda Burrow DD:  03/11/01 TD:  03/11/01 Job: [id number]

## 2010-06-24 NOTE — H&P (Signed)
[facility]  Patient:    Brandi Dunn, Brandi Dunn Visit Number: [id number] MRN: [id number]          Service Type: DSU Location: DAY Attending Physician:  Tilda Burrow Dictated by:   Christin Bach, M.D. Admit Date:  10/31/2000 Discharge Date: 10/31/2000                           History and Physical  ADMITTING DIAGNOSIS:  Missed abortion.  HISTORY OF PRESENT ILLNESS:  This is a 30 year old female, gravida 3, para 3, AB 2, with two prior miscarriages in the past year, is admitted at his time at eight weeks and two days by first ultrasound this pregnancy, 10 weeks by menses with ultrasound confirmation of missed AB.  The patient was seen initially on February 01, 2001, at eight weeks and six days by menses, and found to have a tiny sac with a slower than normal fetal heart rate, with follow up now two weeks later showing evidence of missed AB with absence of fetal heart pressure and essentially no change in size of fetal pole, so death occurred shortly after the February 01, 2001 visit.  She patient understands the inevitability of pregnancy loss, and request once again that a D&C be performed.  PRENATAL LABORATORY DATA:  Includes blood type O-positive, GBS negative, rubella immune.  Present hemoglobin 13, hematocrit 39.  Hepatitis, HIV, GC, and Chlamydia are all negative.  PAST MEDICAL HISTORY:  Benign.  PAST SURGICAL HISTORY:  D&C x2.  SOCIAL HISTORY:  Single, unemployed.  FAMILY HISTORY:  Positive for hypertension.  PHYSICAL EXAMINATION:  VITAL SIGNS:  Height 5 feet and 5 inches, weight 202-3/4, blood pressure 118/70.  GENERAL:  Exam shows an alert and oriented [place] American female, oriented x3.  HEENT:  Pupils equal, round, and reactive.  Extraocular movements intact.  ABDOMEN:  Unremarkable, nontender.  GENITALIA:  External genitalia revealed a normal female.  Cervix nontender. Uterus anterior.  Adnexa negative for  masses.  ASSESSMENT:  Missed abortion.  PLAN:  Suction D&C on January ____, 2003. Dictated by:   Christin Bach, M.D. Attending Physician:  Tilda Burrow DD:  02/20/01 TD:  02/20/01 Job: [id number]

## 2010-06-24 NOTE — H&P (Signed)
NAME:  Brandi Dunn, Brandi Dunn                        ACCOUNT NO.:  [id number]   MEDICAL RECORD NO.:  [id number]                  PATIENT TYPE:   LOCATION:                                       FACILITY:  [facility]   PHYSICIAN:  Tilda Burrow, M.D.              DATE OF BIRTH:  [birth year]   DATE OF ADMISSION:  05/21/2003  DATE OF DISCHARGE:                                HISTORY & PHYSICAL   IMPRESSION:  Pregnancy, 39 weeks 3 days, elective induction of labor.   HISTORY OF PRESENT ILLNESS:  This 30 year old female, gravida 4, para 0,  Ab3, LMP August 20, 2002, placing menstrual [facility] May 27, 2003, with  ultrasound-corrected [facility] of May 24, 2003 and May 20, 2003 based on first  and second trimester ultrasounds, is admitted on May 21, 2003 for  requested induction of labor.  The patient has been followed through the  pregnancy with 19 prenatal visits with size greater than dates, initially  felt due to body habitus.  She passed a glucose tolerance test without  difficulty.  She has been clearly emphatic with her desire for induction  over her past few visits.  Cervical exam shows the cervix to be 1 cm, 50%,  minus 2, quite soft with vertex well-applied to lower uterine segment.  Likelihood of induction as requested, though patient is considered  reasonable.  The patient has had specific review of the fact that we can get  into any problems with induction that occur in spontaneous labor including  the need for emergency delivery by C-section.  The increased incidence of C-  section due to failure to progress with induced labors has been reviewed.   Blood type O-positive.  Urine drug screen negative.  Rubella immunity  present.  Hemoglobin 12, hematocrit 37.  Hepatitis, HIV, GC, Chlamydia and  RPR all negative.  MSAFP normal at 1 in 5000.  Sickledex negative.  Infant  is a female.  Circumcision is requested (MA).   PAST MEDICAL HISTORY:  Medical history positive for migraines, requiring  Imitrex when not pregnant.   SURGICAL HISTORY:  D&C x3.   ALLERGIES:  None.   MEDICATIONS:  Prevacid 15 mg -- 30 mg daily.   PHYSICAL EXAMINATION:  GENERAL:  General exam shows a healthy medium-  complexioned African American female, alert and oriented x3.  VITAL SIGNS:  Weight 195.  Blood pressure 126/72.  HEENT:  Pupils equal, round and reactive to light.  Extraocular movements  intact.  NECK:  Neck supple.  ABDOMEN:  Fundal height 40-41 cm.  Estimated fetal weight 8 pounds.  PELVIC:  Cervix 1 cm, 50%, minus 2, vertex well-applied and cervix  considered favorable, based on texture.   PLAN:  Balloon cervical ripening on May 21, 2003 and Pitocin induction on  May 22, 2003.     ___________________________________________  Tilda Burrow, M.D.   JVF/MEDQ  D:  05/19/2003  T:  05/19/2003  Job:  [id number]   cc:   Francoise Schaumann. Halm, D.O.  [address]  [place]  [place] [address]  Fax: [phone]

## 2010-06-24 NOTE — Op Note (Signed)
NAME:  Brandi Dunn, Brandi Dunn                        ACCOUNT NO.:  [id number]   MEDICAL RECORD NO.:  [id number]                   PATIENT TYPE:  INP   LOCATION:  A410                                 FACILITY:  [facility]   PHYSICIAN:  Jacklyn Shell, C.N.M.    DATE OF BIRTH:  March 28, 1980   DATE OF PROCEDURE:  DATE OF DISCHARGE:                                 OPERATIVE REPORT   DELIVERY NOTE   The patient developed some rectal pressure about 1255 and so we decided to  begin pushing.  After a brief second stage she delivered a viable female  infant at 66.  The shoulders were delivered by using traction on the  posterior axilla which rotated to the anterior and then was released without  difficulty.  Weight was 7 pounds 13 ounces; Apgars 9 and 9.  Twenty units of  Pitocin diluted in 1000 cc of lactated Ringers was then infused rapidly IV.   The placenta separated and then delivered by a controlled cord traction and  maternal pushing effort at 1335.  It was inspected and appeared to be intact  with a 3-vessel cord. The patient still oozed some blood, so she was given  125 mcg of Hemabate IM x1.  The fundus became firm and bleeding was under  control.  The vagina was then inspected and a small first-degree laceration,  not requiring repair was noted.  Estimated blood loss 500 cc.      ___________________________________________                                            Jacklyn Shell, C.N.M.   FC/MEDQ  D:  05/22/2003  T:  05/22/2003  Job:  [id number]

## 2010-06-24 NOTE — Op Note (Signed)
NAME:  Brandi Dunn, Brandi Dunn                        ACCOUNT NO.:  [id number]   MEDICAL RECORD NO.:  [id number]                   PATIENT TYPE:  INP   LOCATION:  A417                                 FACILITY:  [facility]   PHYSICIAN:  Lazaro Arms, M.D.                DATE OF BIRTH:  03/25/1980   DATE OF PROCEDURE:  05/22/2003  DATE OF DISCHARGE:                                 OPERATIVE REPORT   PROCEDURE:  Epidural placement.   INDICATIONS FOR PROCEDURE:  [name] is a 30 year old gravida 1 at 6-7 cm  dilated requesting an epidural.   DESCRIPTION OF PROCEDURE:  The patient was placed in the sitting position.  The L2-L3 interspace was identified.  Betadine prep was used.  Lidocaine 1%  was injected as a local anesthetic.  A 17-gauge Tuohy needle was used, and  the loss of resistance technique was employed.  One pass was required, and  the epidural space was found without difficulty.  Bupivacaine 0.125% plain,  10 cc, was given as a test dose and taped down 5 cm into the epidural space.  After assuring that her blood pressure was not going to drop at that point,  an additional 10 cc was given to dose up the epidural.  The continuous pump  was then started at 12 cc an hour.  The patient tolerated the procedure  well.  Fetal heart rate tracing was reassuring, and the blood pressure was  stable.      ___________________________________________                                            Lazaro Arms, M.D.   LHE/MEDQ  D:  05/22/2003  T:  05/22/2003  Job:  [id number]

## 2010-11-10 LAB — CBC
HCT: 36.4 % (ref 36.0–46.0)
Hemoglobin: 12 g/dL (ref 12.0–15.0)
MCHC: 32.9 g/dL (ref 30.0–36.0)
MCV: 86.2 fL (ref 78.0–100.0)
Platelets: 195 10*3/uL (ref 150–400)
RBC: 4.22 MIL/uL (ref 3.87–5.11)
RDW: 13.1 % (ref 11.5–15.5)
WBC: 10.3 10*3/uL (ref 4.0–10.5)

## 2010-11-10 LAB — URINALYSIS, ROUTINE W REFLEX MICROSCOPIC
Bilirubin Urine: NEGATIVE
Glucose, UA: NEGATIVE mg/dL
Ketones, ur: NEGATIVE mg/dL
Leukocytes, UA: NEGATIVE
Nitrite: NEGATIVE
Protein, ur: NEGATIVE mg/dL
Specific Gravity, Urine: >1.03 — ABNORMAL HIGH (ref 1.005–1.030)
Urobilinogen, UA: 0.2 mg/dL (ref 0.0–1.0)
pH: 5.5 (ref 5.0–8.0)

## 2010-11-10 LAB — DIFFERENTIAL
Basophils Absolute: 0 10*3/uL (ref 0.0–0.1)
Basophils Relative: 0 % (ref 0–1)
Eosinophils Absolute: 0.1 10*3/uL (ref 0.0–0.7)
Eosinophils Relative: 1 % (ref 0–5)
Lymphocytes Relative: 33 % (ref 12–46)
Lymphs Abs: 3.4 10*3/uL (ref 0.7–4.0)
Monocytes Absolute: 0.4 10*3/uL (ref 0.1–1.0)
Monocytes Relative: 4 % (ref 3–12)
Neutro Abs: 6.3 10*3/uL (ref 1.7–7.7)
Neutrophils Relative %: 62 % (ref 43–77)

## 2010-11-10 LAB — BASIC METABOLIC PANEL
BUN: 11 mg/dL (ref 6–23)
CO2: 25 mEq/L (ref 19–32)
Calcium: 9.7 mg/dL (ref 8.4–10.5)
Chloride: 106 mEq/L (ref 96–112)
Creatinine, Ser: 0.96 mg/dL (ref 0.4–1.2)
GFR calc Af Amer: >60 mL/min (ref >60)
GFR calc non Af Amer: >60 mL/min (ref >60)
Glucose, Bld: 157 mg/dL — ABNORMAL HIGH (ref 70–99)
Potassium: 3.4 mEq/L — ABNORMAL LOW (ref 3.5–5.1)
Sodium: 138 mEq/L (ref 135–145)

## 2010-11-10 LAB — URINE MICROSCOPIC-ADD ON

## 2010-11-10 LAB — PREGNANCY, URINE: Preg Test, Ur: NEGATIVE

## 2010-11-15 LAB — POCT CARDIAC MARKERS
CKMB, poc: <1 — ABNORMAL LOW
Myoglobin, poc: 56.7
Operator id: 217071
Troponin i, poc: <0.05

## 2010-11-15 LAB — D-DIMER, QUANTITATIVE: D-Dimer, Quant: 0.23

## 2010-11-17 LAB — URINALYSIS, ROUTINE W REFLEX MICROSCOPIC
Glucose, UA: NEGATIVE
Hgb urine dipstick: NEGATIVE
Nitrite: NEGATIVE
Protein, ur: NEGATIVE
Specific Gravity, Urine: >1.03 — ABNORMAL HIGH
Urobilinogen, UA: 2 — ABNORMAL HIGH
pH: 6

## 2010-11-17 LAB — COMPREHENSIVE METABOLIC PANEL
ALT: 23
AST: 40 — ABNORMAL HIGH
Albumin: 3.2 — ABNORMAL LOW
Alkaline Phosphatase: 39
BUN: 10
CO2: 29
Calcium: 9.2
Chloride: 105
Creatinine, Ser: 0.95
GFR calc Af Amer: >60
GFR calc non Af Amer: >60
Glucose, Bld: 101 — ABNORMAL HIGH
Potassium: 3.7
Sodium: 138
Total Bilirubin: 0.4
Total Protein: 6.1

## 2010-11-17 LAB — DIFFERENTIAL
Basophils Absolute: 0
Basophils Relative: 0
Eosinophils Absolute: 0.1
Eosinophils Relative: 1
Lymphocytes Relative: 42
Lymphs Abs: 3.1
Monocytes Absolute: 0.5
Monocytes Relative: 7
Neutro Abs: 3.7
Neutrophils Relative %: 50

## 2010-11-17 LAB — CBC
HCT: 32.9 — ABNORMAL LOW
Hemoglobin: 10.9 — ABNORMAL LOW
MCHC: 33.1
MCV: 84.8
Platelets: 184
RBC: 3.88
RDW: 13.3
WBC: 7.5

## 2010-11-17 LAB — LIPASE, BLOOD: Lipase: 20

## 2010-11-17 LAB — PREGNANCY, URINE: Preg Test, Ur: NEGATIVE

## 2011-03-15 ENCOUNTER — Other Ambulatory Visit: Payer: Self-pay | Admitting: Family Medicine

## 2011-03-21 ENCOUNTER — Other Ambulatory Visit (HOSPITAL_COMMUNITY): Payer: Self-pay

## 2011-03-22 ENCOUNTER — Other Ambulatory Visit: Payer: Self-pay | Admitting: Family Medicine

## 2011-03-22 ENCOUNTER — Ambulatory Visit (HOSPITAL_COMMUNITY)
Admission: RE | Admit: 2011-03-22 | Discharge: 2011-03-22 | Disposition: A | Discharge status: Home / Self Care | Payer: Medicaid Other | Source: Ambulatory Visit | Attending: Family Medicine | Admitting: Family Medicine

## 2011-03-22 DIAGNOSIS — K8020 Calculus of gallbladder without cholecystitis without obstruction: Secondary | ICD-10-CM | POA: Insufficient documentation

## 2011-03-22 DIAGNOSIS — R1011 Right upper quadrant pain: Secondary | ICD-10-CM | POA: Insufficient documentation

## 2011-03-22 IMAGING — US US ABDOMEN LIMITED
1 series · 14 of 25 positions shown · non-contrast
Comparison: CT abdomen and pelvis [DATE].

CLINICAL DATA: Right upper quadrant pain.

LIMITED ABDOMINAL ULTRASOUND - RIGHT UPPER QUADRANT

[Series 1: us abdomen limited · 0.28mm/px · 14 of 43 slices shown]
[im 1/43]
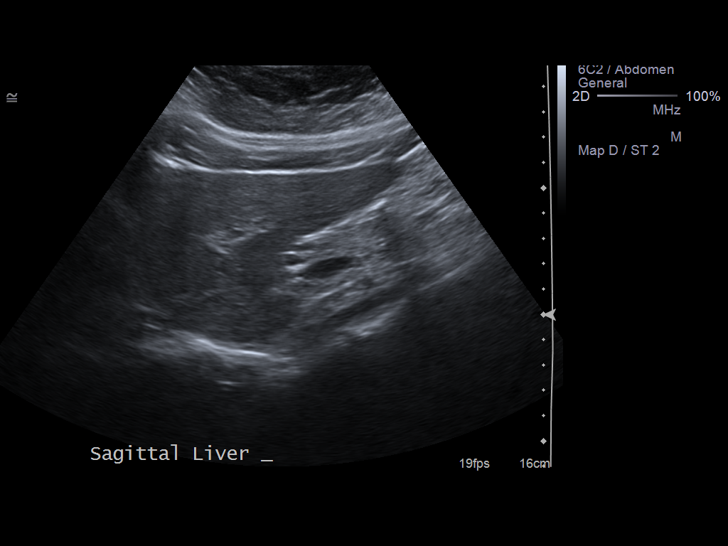
[im 4/43]
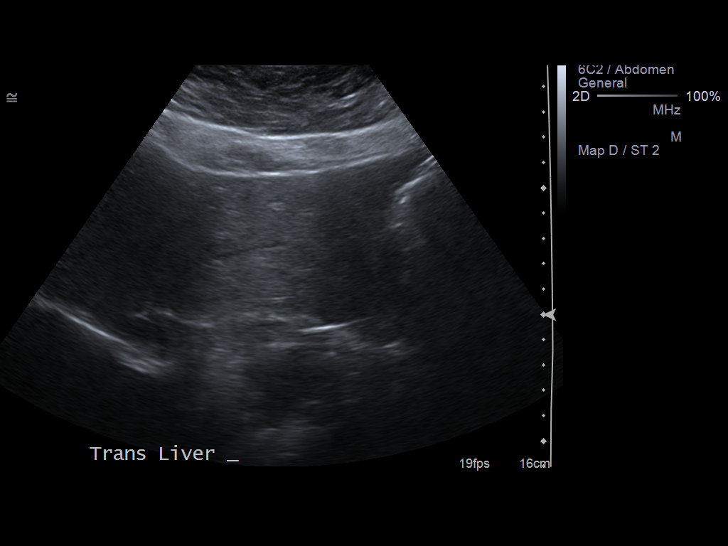
[im 8/43]
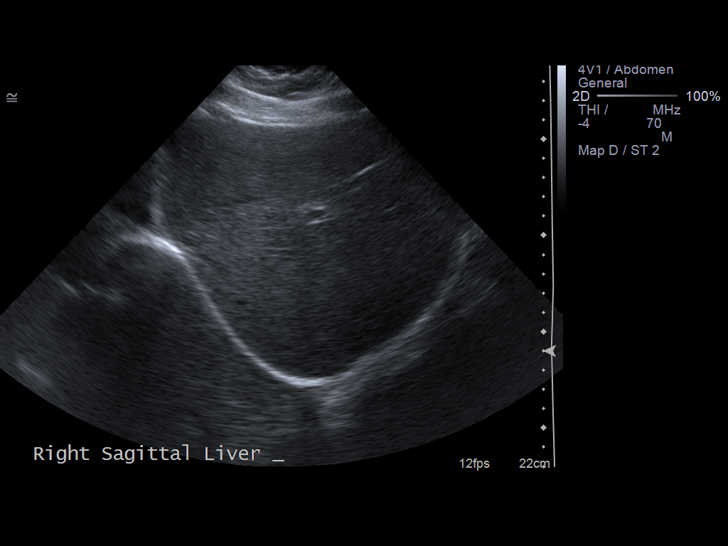
[im 11/43]
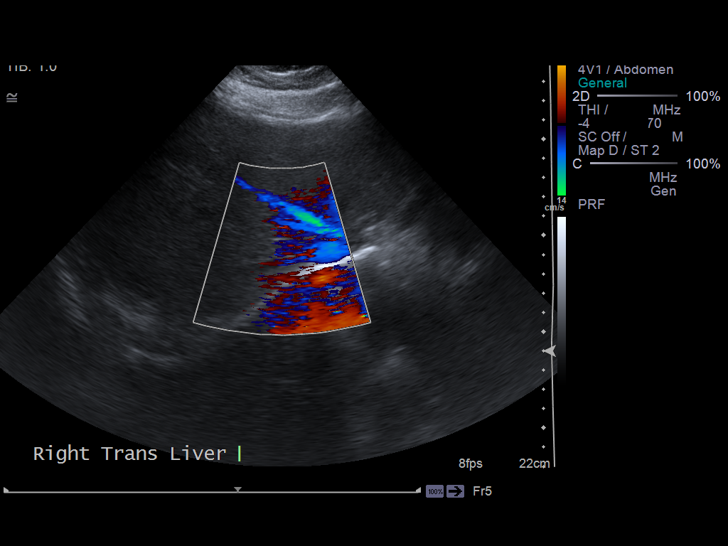
[im 15/43]
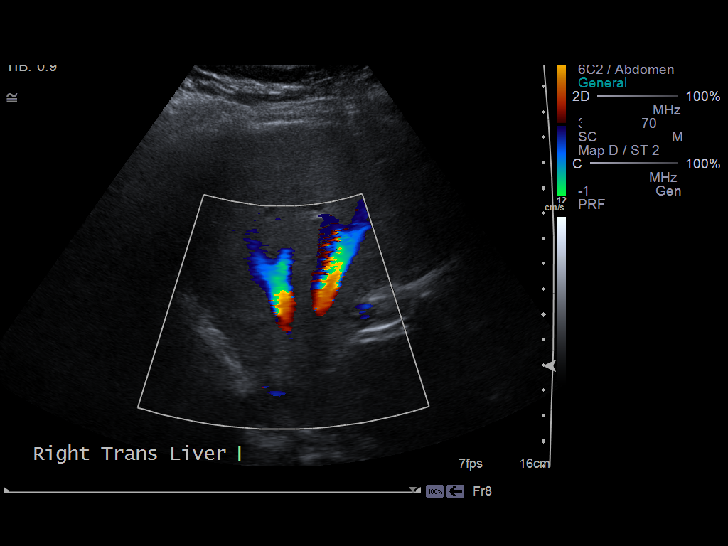
[im 16/43]
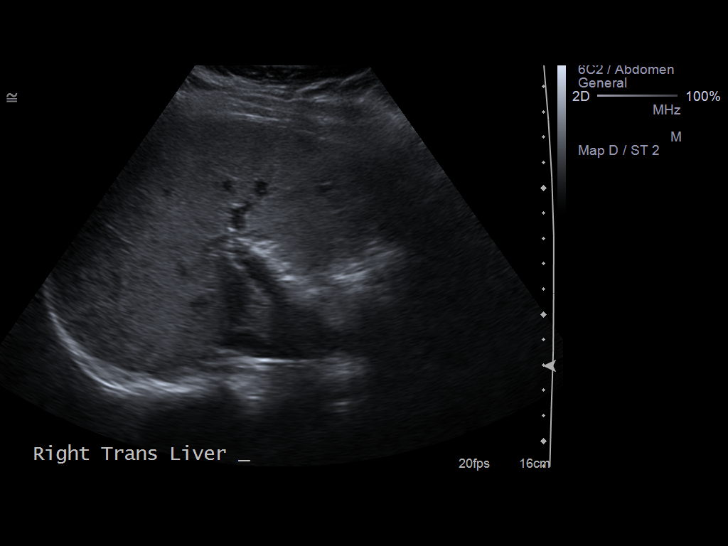
[im 20/43]
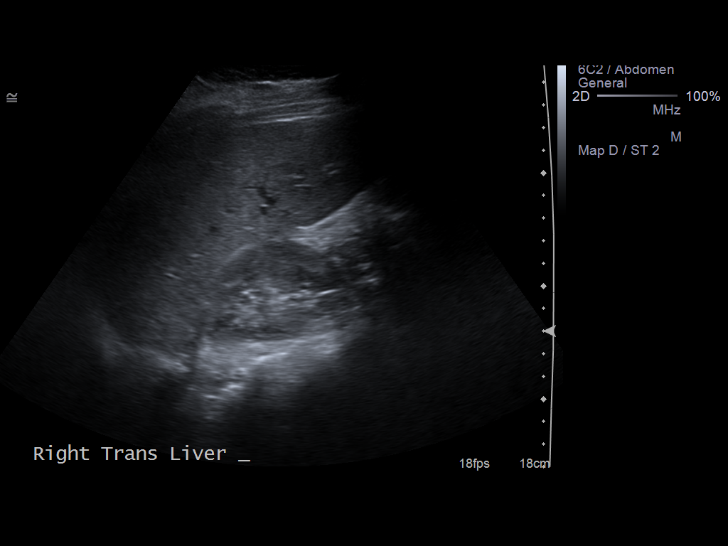
[im 23/43]
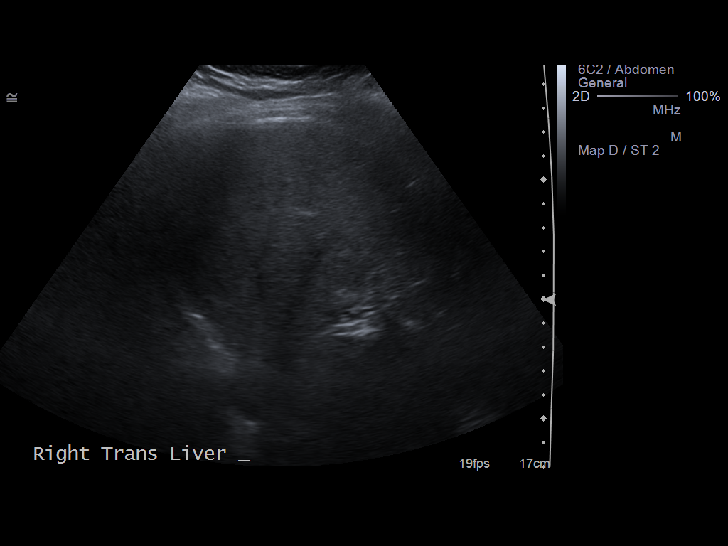
[im 27/43]
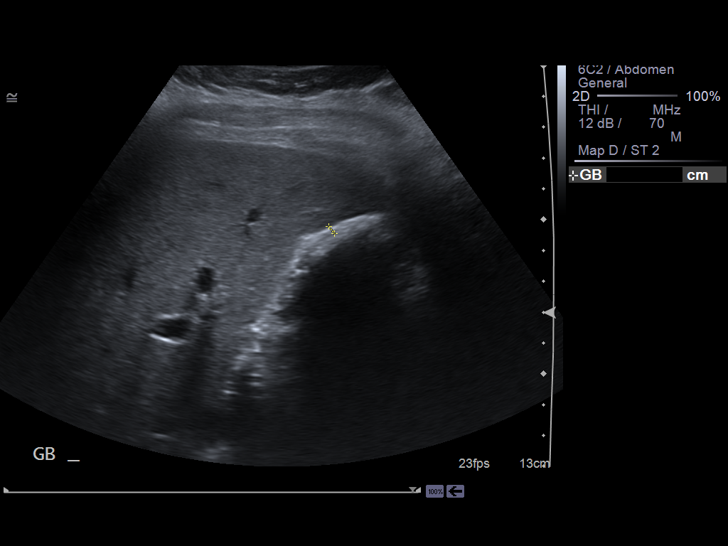
[im 29/43]
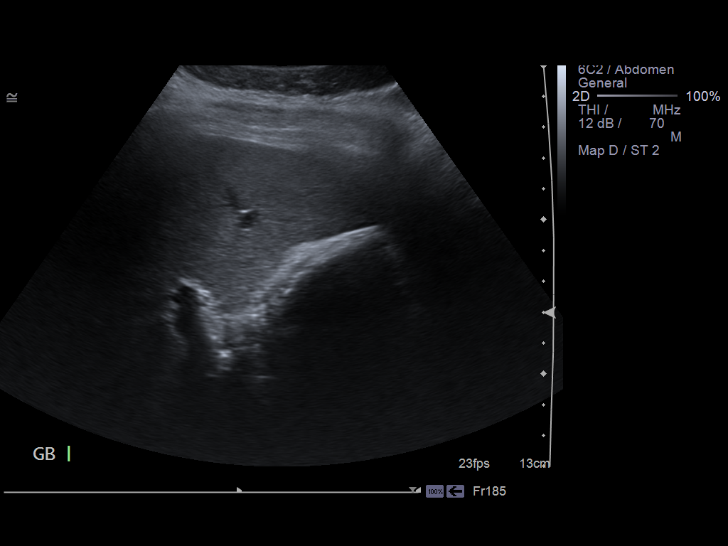
[im 32/43]
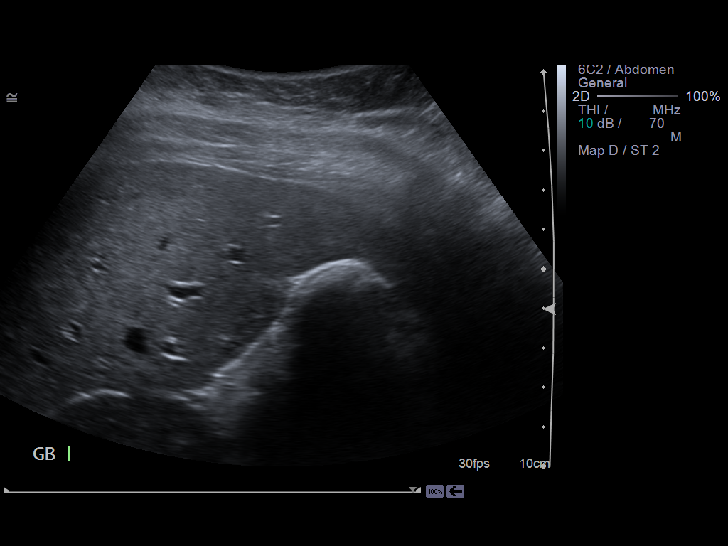
[im 36/43]
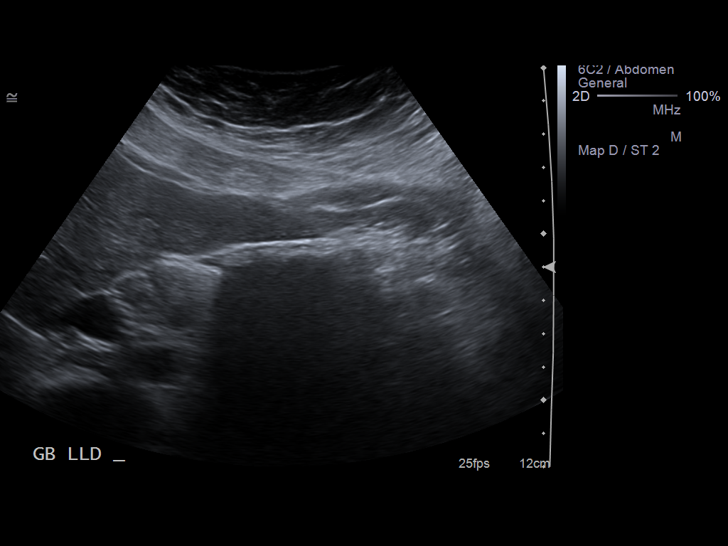
[im 39/43]
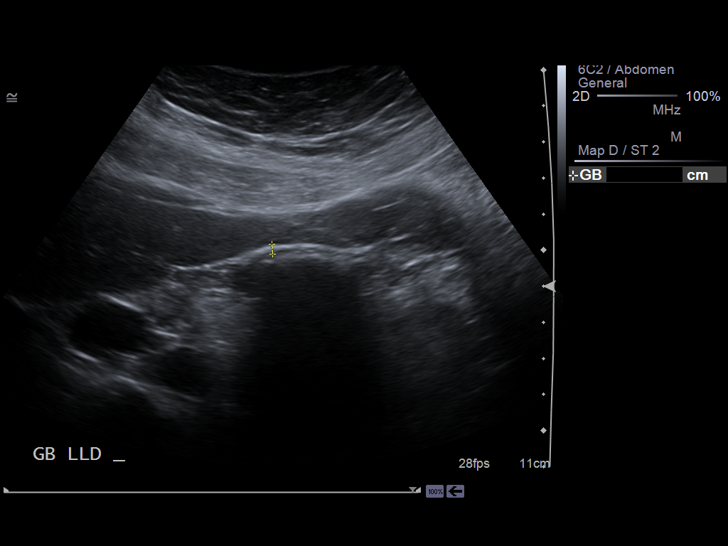
[im 43/43]
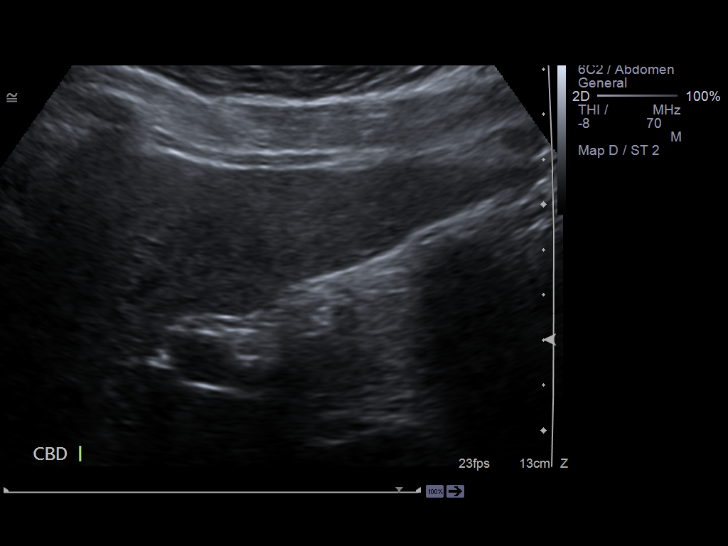

[14 of 25 positions shown; findings below may reference images not displayed]

FINDINGS: Gallbladder:  The gallbladder is filled with stones with a wall
echo shadow sign identified.  There is no wall thickening or
pericholecystic fluid.  Sonographer reports negative Murphy's sign.

Common bile duct:  Measures 0.3 cm.

Liver:  Appears normal.
IMPRESSION: The gallbladder is filled with stones but no evidence of acute
cholecystitis is present.

## 2011-04-06 NOTE — H&P (Signed)
  NTS SOAP Note  Vital Signs:  Vitals as of: 04/06/2011: Systolic 110: Diastolic 73: Heart Rate 86: Temp 97.55F: Height 108ft 4in: Weight 223Lbs 0 Ounces: OFC 0in: Respiratory Rate 0: O2 Saturation 0: Pain Level 0: BMI 38  BMI : 38.28 kg/m2  Subjective: This 27 Years 75 Months old Female presents for of  ABDOMINAL ISSUES: ,Has been having intermittent right upper quadrant abdominal pain, nausea, and bloating for many years.  Does have fatty food intolerance.  No fever, chills, jaundice.  U/S of gallbladder reveals cholelithiasis.  Review of Symptoms:  Constitutional:unremarkable Head:unremarkable Eyes:unremarkable Nose/Mouth/Throat:unremarkable Cardiovascular:unremarkable Respiratory:unremarkable see above Genitourinary:unremarkable Musculoskeletal:unremarkable Skin:unremarkable Hematolgic/Lymphatic:unremarkable Allergic/Immunologic:unremarkable   Past Medical History:Reviewed   Past Medical History  Surgical History: D and C Medical Problems: reflux Allergies: nkda Medications: pantoprazole   Social History:Reviewed   Social History  Preferred Language: English ([place]) Race:  Black or African American Ethnicity: Not Hispanic / Latino Age: 20 Years 6 Months Marital Status:  S Alcohol:  No Recreational drug(s):  No   Smoking Status: Never smoker reviewed on 04/06/2011  Family History:Reviewed   Family History              Maternal Grandmother:  Diabetes Type II             Paternal Grandmother:  Diabetes Type II    Objective Information: General:Well appearing, well nourished in no distress. Skin:no rash or prominent lesions No scleral icterus Neck:Supple without lymphadenopathy.  Heart:RRR, no murmur or gallop.  Normal S1, S2.  No S3, S4.  Lungs:CTA bilaterally, no wheezes, rhonchi, rales.  Breathing unlabored. Abdomen:Soft, slightly tender in right upper quadrant to deep  palpation, ND, normal bowel sounds, no HSM, no masses.  No peritoneal signs.  Assessment:Cholelithiasis, biliary colic  Diagnosis &amp; Procedure: DiagnosisCode: 574.00, ProcedureCode: 16109,    Plan:Scheduled for laparoscopic cholecystectomy for 04/12/11.   Patient Education:Alternative treatments to surgery were discussed with patient (and family).Risks and benefits  of procedure were fully explained to the patient (and family) who gave informed consent. Patient/family questions were addressed.  Follow-up:Pending Surgery

## 2011-04-07 ENCOUNTER — Encounter (HOSPITAL_COMMUNITY): Payer: Self-pay

## 2011-04-07 ENCOUNTER — Encounter (HOSPITAL_COMMUNITY): Payer: Self-pay | Admitting: Pharmacy Technician

## 2011-04-07 ENCOUNTER — Encounter (HOSPITAL_COMMUNITY)
Admission: RE | Admit: 2011-04-07 | Discharge: 2011-04-07 | Disposition: A | Discharge status: Home / Self Care | Payer: Medicaid Other | Source: Ambulatory Visit | Attending: General Surgery | Admitting: General Surgery

## 2011-04-07 HISTORY — DX: Personal history of urinary calculi: Z87.442

## 2011-04-07 HISTORY — DX: Gastro-esophageal reflux disease without esophagitis: K21.9

## 2011-04-07 LAB — HCG, SERUM, QUALITATIVE: Preg, Serum: NEGATIVE

## 2011-04-07 LAB — DIFFERENTIAL
Basophils Absolute: 0 10*3/uL (ref 0.0–0.1)
Basophils Relative: 0 % (ref 0–1)
Eosinophils Absolute: 0 10*3/uL (ref 0.0–0.7)
Eosinophils Relative: 0 % (ref 0–5)
Lymphocytes Relative: 49 % — ABNORMAL HIGH (ref 12–46)
Lymphs Abs: 3.5 10*3/uL (ref 0.7–4.0)
Monocytes Absolute: 0.4 10*3/uL (ref 0.1–1.0)
Monocytes Relative: 5 % (ref 3–12)
Neutro Abs: 3.2 10*3/uL (ref 1.7–7.7)
Neutrophils Relative %: 45 % (ref 43–77)

## 2011-04-07 LAB — BASIC METABOLIC PANEL
BUN: 11 mg/dL (ref 6–23)
CO2: 29 mEq/L (ref 19–32)
Calcium: 10.8 mg/dL — ABNORMAL HIGH (ref 8.4–10.5)
Chloride: 102 mEq/L (ref 96–112)
Creatinine, Ser: 0.83 mg/dL (ref 0.50–1.10)
GFR calc Af Amer: >90 mL/min (ref >90)
GFR calc non Af Amer: >90 mL/min (ref >90)
Glucose, Bld: 87 mg/dL (ref 70–99)
Potassium: 3.9 mEq/L (ref 3.5–5.1)
Sodium: 138 mEq/L (ref 135–145)

## 2011-04-07 LAB — SURGICAL PCR SCREEN
MRSA, PCR: NEGATIVE
Staphylococcus aureus: POSITIVE — AB

## 2011-04-07 LAB — CBC
HCT: 38.2 % (ref 36.0–46.0)
Hemoglobin: 12.4 g/dL (ref 12.0–15.0)
MCH: 28.3 pg (ref 26.0–34.0)
MCHC: 32.5 g/dL (ref 30.0–36.0)
MCV: 87.2 fL (ref 78.0–100.0)
Platelets: 238 10*3/uL (ref 150–400)
RBC: 4.38 MIL/uL (ref 3.87–5.11)
RDW: 13 % (ref 11.5–15.5)
WBC: 7.1 10*3/uL (ref 4.0–10.5)

## 2011-04-07 NOTE — Patient Instructions (Addendum)
Brandi Dunn  04/07/2011   Your procedure is scheduled on:  Wednesday, 04/12/11  [facility] to [facility] [place] [facility].  Call this number if you have problems the morning of surgery: [phone]   Remember:   Do not eat food:After Midnight.  May have clear liquids:until Midnight .  Clear liquids include soda, tea, black coffee, apple or grape juice, broth.  Take these medicines the morning of surgery with A SIP OF WATER: protonix   Do not wear jewelry, make-up or nail polish.  Do not wear lotions, powders, or perfumes. You may wear deodorant.  Do not shave 48 hours prior to surgery.  Do not bring valuables to the hospital.  Contacts, dentures or bridgework may not be worn into surgery.  Leave suitcase in the car. After surgery it may be brought to your room.  For patients admitted to the hospital, checkout time is 11:00 AM the day of discharge.   Patients discharged the day of surgery will not be allowed to drive home.  Name and phone number of your driver: driver  Special Instructions: CHG Shower Use Special Wash: 1/2 bottle night before surgery and 1/2 bottle morning of surgery.   Please read over the following fact sheets that you were given: Pain Booklet, Coughing and Deep Breathing, MRSA Information, Surgical Site Infection Prevention, Anesthesia Post-op Instructions and Care and Recovery After Surgery   Laparoscopic Cholecystectomy Care After These instructions give you information on caring for yourself after your procedure. Your doctor may also give you more specific instructions. Call your doctor if you have any problems or questions after your procedure. HOME CARE  Change your bandages (dressings) as told by your doctor.   Keep the wound dry and clean. Wash the wound gently with soap and water. Pat the wound dry with a clean towel.   Do not take baths, swim, or use hot tubs for 10 days, or as told by your doctor.   Only take medicine as told by your doctor.   Eat a normal  diet as told by your doctor.   Do not lift anything heavier than 25 pounds (11.5 kg), or as told by your doctor.   Do not play contact sports for 1 week, or as told by your doctor.  GET HELP RIGHT AWAY IF:   Your wound is red, puffy (swollen), or painful.   You have yellowish-white fluid (pus) coming from the wound.   You have fluid draining from the wound for more than 1 day.   You have a bad smell coming from the wound.   Your wound breaks open.   You have a rash.   You have trouble breathing.   You have chest pain.   You have a bad reaction to your medicine.   You have a fever.   You have pain in the shoulders (shoulder strap areas).   You feel dizzy or pass out (faint).   You have severe belly (abdominal) pain.   You feel sick to your stomach (nauseous) or throw up (vomit) for more than 1 day.  MAKE SURE YOU:  Understand these instructions.   Will watch your condition.   Will get help right away if you are not doing well or get worse.  Document Released: 11/02/2007 Document Revised: 10/05/2010 Document Reviewed: 07/12/2010 [employer] Patient Information 2012 [place], [place].

## 2011-04-12 ENCOUNTER — Encounter (HOSPITAL_COMMUNITY)
Admission: RE | Disposition: A | Discharge status: Home / Self Care | Payer: Self-pay | Source: Ambulatory Visit | Attending: General Surgery

## 2011-04-12 ENCOUNTER — Encounter (HOSPITAL_COMMUNITY): Payer: Self-pay | Admitting: Anesthesiology

## 2011-04-12 ENCOUNTER — Ambulatory Visit (HOSPITAL_COMMUNITY): Payer: Medicaid Other | Admitting: Anesthesiology

## 2011-04-12 ENCOUNTER — Encounter (HOSPITAL_COMMUNITY): Payer: Self-pay | Admitting: *Deleted

## 2011-04-12 ENCOUNTER — Ambulatory Visit (HOSPITAL_COMMUNITY)
Admission: RE | Admit: 2011-04-12 | Discharge: 2011-04-12 | Disposition: A | Discharge status: Home / Self Care | Payer: Medicaid Other | Source: Ambulatory Visit | Attending: General Surgery | Admitting: General Surgery

## 2011-04-12 DIAGNOSIS — K8010 Calculus of gallbladder with chronic cholecystitis without obstruction: Secondary | ICD-10-CM | POA: Insufficient documentation

## 2011-04-12 DIAGNOSIS — Z01812 Encounter for preprocedural laboratory examination: Secondary | ICD-10-CM | POA: Insufficient documentation

## 2011-04-12 HISTORY — PX: CHOLECYSTECTOMY: SHX55

## 2011-04-12 SURGERY — LAPAROSCOPIC CHOLECYSTECTOMY
Anesthesia: General | Site: Abdomen | Wound class: Clean Contaminated

## 2011-04-12 MED ORDER — ROCURONIUM BROMIDE 100 MG/10ML IV SOLN
INTRAVENOUS | Status: DC | PRN
  Administered 2011-04-12: 30 mg via INTRAVENOUS

## 2011-04-12 MED ORDER — PROPOFOL 10 MG/ML IV EMUL
INTRAVENOUS | Status: DC | PRN
  Administered 2011-04-12: 180 mg via INTRAVENOUS

## 2011-04-12 MED ORDER — PROPOFOL 10 MG/ML IV EMUL
INTRAVENOUS | Status: AC
  Filled 2011-04-12: qty 20

## 2011-04-12 MED ORDER — SODIUM CHLORIDE 0.9 % IR SOLN
Status: DC | PRN
  Administered 2011-04-12: 1000 mL

## 2011-04-12 MED ORDER — BUPIVACAINE HCL (PF) 0.5 % IJ SOLN
INTRAMUSCULAR | Status: AC
  Filled 2011-04-12: qty 30

## 2011-04-12 MED ORDER — FENTANYL CITRATE 0.05 MG/ML IJ SOLN
INTRAMUSCULAR | Status: AC
  Administered 2011-04-12: 50 ug via INTRAVENOUS
  Filled 2011-04-12: qty 5

## 2011-04-12 MED ORDER — LACTATED RINGERS IV SOLN
INTRAVENOUS | Status: DC | PRN
  Administered 2011-04-12 (×2): via INTRAVENOUS

## 2011-04-12 MED ORDER — GLYCOPYRROLATE 0.2 MG/ML IJ SOLN
INTRAMUSCULAR | Status: DC | PRN
  Administered 2011-04-12: .8 mg via INTRAVENOUS

## 2011-04-12 MED ORDER — OXYCODONE-ACETAMINOPHEN 7.5-325 MG PO TABS: 1.0000 | ORAL_TABLET | ORAL | Status: AC | PRN

## 2011-04-12 MED ORDER — FENTANYL CITRATE 0.05 MG/ML IJ SOLN
INTRAMUSCULAR | Status: AC
  Administered 2011-04-12: 50 ug via INTRAVENOUS
  Filled 2011-04-12: qty 2

## 2011-04-12 MED ORDER — BUPIVACAINE HCL 0.5 % IJ SOLN
INTRAMUSCULAR | Status: DC | PRN
  Administered 2011-04-12: 10 mL

## 2011-04-12 MED ORDER — KETOROLAC TROMETHAMINE 30 MG/ML IJ SOLN
30.0000 mg | Freq: Once | INTRAMUSCULAR | Status: AC
  Administered 2011-04-12: 30 mg via INTRAVENOUS

## 2011-04-12 MED ORDER — HEMOSTATIC AGENTS (NO CHARGE) OPTIME
TOPICAL | Status: DC | PRN
  Administered 2011-04-12: 1 via TOPICAL

## 2011-04-12 MED ORDER — FENTANYL CITRATE 0.05 MG/ML IJ SOLN
25.0000 ug | INTRAMUSCULAR | Status: DC | PRN
  Administered 2011-04-12 (×3): 50 ug via INTRAVENOUS

## 2011-04-12 MED ORDER — NEOSTIGMINE METHYLSULFATE 1 MG/ML IJ SOLN
INTRAMUSCULAR | Status: DC | PRN
  Administered 2011-04-12: 5 mg via INTRAVENOUS

## 2011-04-12 MED ORDER — MIDAZOLAM HCL 2 MG/2ML IJ SOLN
INTRAMUSCULAR | Status: AC
  Administered 2011-04-12: 2 mg via INTRAVENOUS
  Filled 2011-04-12: qty 2

## 2011-04-12 MED ORDER — MIDAZOLAM HCL 2 MG/2ML IJ SOLN
INTRAMUSCULAR | Status: AC
  Filled 2011-04-12: qty 2

## 2011-04-12 MED ORDER — ROCURONIUM BROMIDE 50 MG/5ML IV SOLN
INTRAVENOUS | Status: AC
  Filled 2011-04-12: qty 1

## 2011-04-12 MED ORDER — ONDANSETRON HCL 4 MG/2ML IJ SOLN
INTRAMUSCULAR | Status: AC
  Administered 2011-04-12: 4 mg via INTRAVENOUS
  Filled 2011-04-12: qty 2

## 2011-04-12 MED ORDER — CEFAZOLIN SODIUM-DEXTROSE 2-3 GM-% IV SOLR
INTRAVENOUS | Status: AC
  Filled 2011-04-12: qty 50

## 2011-04-12 MED ORDER — FENTANYL CITRATE 0.05 MG/ML IJ SOLN
INTRAMUSCULAR | Status: DC | PRN
  Administered 2011-04-12: 25 ug via INTRAVENOUS
  Administered 2011-04-12: 150 ug via INTRAVENOUS

## 2011-04-12 MED ORDER — KETOROLAC TROMETHAMINE 30 MG/ML IJ SOLN
INTRAMUSCULAR | Status: AC
  Administered 2011-04-12: 30 mg via INTRAVENOUS
  Filled 2011-04-12: qty 1

## 2011-04-12 MED ORDER — ENOXAPARIN SODIUM 40 MG/0.4ML ~~LOC~~ SOLN
40.0000 mg | Freq: Once | SUBCUTANEOUS | Status: AC
  Administered 2011-04-12: 40 mg via SUBCUTANEOUS

## 2011-04-12 MED ORDER — KETOROLAC TROMETHAMINE 30 MG/ML IJ SOLN
INTRAMUSCULAR | Status: AC
  Filled 2011-04-12: qty 1

## 2011-04-12 MED ORDER — CEFAZOLIN SODIUM-DEXTROSE 2-3 GM-% IV SOLR
2.0000 g | INTRAVENOUS | Status: AC
  Administered 2011-04-12: 2 g via INTRAVENOUS

## 2011-04-12 MED ORDER — LACTATED RINGERS IV SOLN
INTRAVENOUS | Status: DC
  Administered 2011-04-12: 1000 mL via INTRAVENOUS

## 2011-04-12 MED ORDER — ONDANSETRON HCL 4 MG/2ML IJ SOLN
4.0000 mg | Freq: Once | INTRAMUSCULAR | Status: AC
  Administered 2011-04-12: 4 mg via INTRAVENOUS

## 2011-04-12 MED ORDER — ENOXAPARIN SODIUM 40 MG/0.4ML ~~LOC~~ SOLN
SUBCUTANEOUS | Status: AC
  Administered 2011-04-12: 40 mg via SUBCUTANEOUS
  Filled 2011-04-12: qty 0.4

## 2011-04-12 MED ORDER — ONDANSETRON HCL 4 MG/2ML IJ SOLN: 4.0000 mg | Freq: Once | INTRAMUSCULAR | Status: DC | PRN

## 2011-04-12 MED ORDER — MIDAZOLAM HCL 2 MG/2ML IJ SOLN
1.0000 mg | INTRAMUSCULAR | Status: DC | PRN
  Administered 2011-04-12 (×2): 2 mg via INTRAVENOUS

## 2011-04-12 SURGICAL SUPPLY — 33 items
APPLIER CLIP ROT 10 11.4 M/L (STAPLE) ×2
APR CLP MED LRG 11.4X10 (STAPLE) ×1
BAG HAMPER (MISCELLANEOUS) ×2 IMPLANT
BAG SPEC RTRVL LRG 6X4 10 (ENDOMECHANICALS) ×1
CLIP APPLIE ROT 10 11.4 M/L (STAPLE) ×1 IMPLANT
CLOTH BEACON ORANGE TIMEOUT ST (SAFETY) ×2 IMPLANT
COVER LIGHT HANDLE STERIS (MISCELLANEOUS) ×4 IMPLANT
DECANTER SPIKE VIAL GLASS SM (MISCELLANEOUS) ×2 IMPLANT
DURAPREP 26ML APPLICATOR (WOUND CARE) ×2 IMPLANT
ELECT REM PT RETURN 9FT ADLT (ELECTROSURGICAL) ×2
ELECTRODE REM PT RTRN 9FT ADLT (ELECTROSURGICAL) ×1 IMPLANT
FILTER SMOKE EVAC LAPAROSHD (FILTER) ×2 IMPLANT
FORMALIN 10 PREFIL 120ML (MISCELLANEOUS) ×2 IMPLANT
GLOVE BIO SURGEON STRL SZ7.5 (GLOVE) ×2 IMPLANT
GLOVE BIOGEL PI IND STRL 7.0 (GLOVE) IMPLANT
GLOVE BIOGEL PI INDICATOR 7.0 (GLOVE) ×2
GLOVE ECLIPSE 6.5 STRL STRAW (GLOVE) ×1 IMPLANT
GLOVE OPTIFIT SS 6.5 STRL BRWN (GLOVE) ×1 IMPLANT
GOWN STRL REIN XL XLG (GOWN DISPOSABLE) ×6 IMPLANT
INST SET LAPROSCOPIC AP (KITS) ×2 IMPLANT
KIT ROOM TURNOVER APOR (KITS) ×2 IMPLANT
KIT TROCAR LAP CHOLE (TROCAR) ×2 IMPLANT
MANIFOLD NEPTUNE II (INSTRUMENTS) ×2 IMPLANT
NS IRRIG 1000ML POUR BTL (IV SOLUTION) ×2 IMPLANT
PACK LAP CHOLE LZT030E (CUSTOM PROCEDURE TRAY) ×2 IMPLANT
PAD ARMBOARD 7.5X6 YLW CONV (MISCELLANEOUS) ×2 IMPLANT
POUCH SPECIMEN RETRIEVAL 10MM (ENDOMECHANICALS) ×2 IMPLANT
SET BASIN LINEN APH (SET/KITS/TRAYS/PACK) ×2 IMPLANT
SPONGE GAUZE 2X2 8PLY STRL LF (GAUZE/BANDAGES/DRESSINGS) ×8 IMPLANT
STAPLER VISISTAT (STAPLE) ×2 IMPLANT
SUT VICRYL 0 UR6 27IN ABS (SUTURE) ×2 IMPLANT
WARMER LAPAROSCOPE (MISCELLANEOUS) ×2 IMPLANT
YANKAUER SUCT 12FT TUBE ARGYLE (SUCTIONS) ×2 IMPLANT

## 2011-04-12 NOTE — Discharge Instructions (Signed)
Laparoscopic Cholecystectomy Care After Refer to this sheet in the next few weeks. These instructions provide you with information on caring for yourself after your procedure. Your caregiver may also give you more specific instructions. Your treatment has been planned according to current medical practices, but problems sometimes occur. Call your caregiver if you have any problems or questions after your procedure. HOME CARE INSTRUCTIONS   Change bandages (dressings) as directed by your caregiver.   Keep the wound dry and clean. The wound may be washed gently with soap and water. Gently blot or dab the area dry.   Do not take baths or use swimming pools or hot tubs for 10 days, or as instructed by your caregiver.   Only take over-the-counter or prescription medicines for pain, discomfort, or fever as directed by your caregiver.   Continue your normal diet as directed by your caregiver.   Do not lift anything heavier than 25 pounds (11.5 kg), or as directed by your caregiver.   Do not play contact sports for 1 week, or as directed by your caregiver.  SEEK MEDICAL CARE IF:   There is redness, swelling, or increasing pain in the wound.   You notice yellowish-white fluid (pus) coming from the wound.   There is drainage from the wound that lasts longer than 1 day.   There is a bad smell coming from the wound or dressing.   The surgical cut (incision) breaks open.  SEEK IMMEDIATE MEDICAL CARE IF:   You develop a rash.   You have difficulty breathing.   You develop chest pain.   You develop any reaction or side effects to medicines given.   You have a fever.   You have increasing pain in the shoulders (shoulder strap areas).   You have dizzy episodes or faint while standing.   You develop severe abdominal pain.   You feel sick to your stomach (nauseous) or throw up (vomit) and this lasts for more than 1 day.  MAKE SURE YOU:   Understand these instructions.   Will watch  your condition.   Will get help right away if you are not doing well or get worse.  Document Released: 01/23/2005 Document Revised: 01/12/2011 Document Reviewed: 07/08/2010 [employer] Patient Information 2012 [employer]. 

## 2011-04-12 NOTE — Op Note (Signed)
Patient:  Brandi Dunn  DOB:  1980/10/03  MRN:  [id number]   Preop Diagnosis:  Cholecystitis, cholelithiasis  Postop Diagnosis:  Same  Procedure:  Laparoscopic cholecystectomy  Surgeon:  Franky Macho, M.D.  Anes:  General endotracheal  Indications:  Patient is a 31 year old white female presents with biliary colic secondary to cholelithiasis. The risks and benefits of the procedure including bleeding, infection, hepatobiliary injury, the possibly of an open procedure were fully explained to the patient, gave informed consent.  Procedure note:  Patient is placed the supine position. After induction of general endotracheal anesthesia, the abdomen was prepped and draped using usual sterile technique with DuraPrep. Surgical site confirmation was performed.  A supraumbilical incision was made down to the fascia. A Veress needle was introduced into the abdominal cavity and confirmation of placement was done using the saline drop test. The abdomen was then insufflated to 16 mm mercury pressure. An 11 mm trochars introduced into the abdominal cavity under direct visualization without difficulty. The patient was placed in reverse Trendelenburg position and additional 11 mm trocar was placed the epigastric region 5 mm trochars were placed the right upper quadrant right flank regions. The liver was inspected and noted within normal limits. Gallbladder was retracted superior laterally. Dissection was begun around the infundibulum of the gallbladder. The cystic duct was first identified. Its junction to the infundibulum flow identified. Endoclips placed proximally distally on the cystic duct and cystic duct was divided. This was likewise done cystic artery. The gallbladder is then freed away from the gallbladder fossa using Bovie electrocautery. The gallbladder was delivered to the epigastric trocar site using an Endo Catch bag. The gallbladder fossa was inspected no abnormal bleeding or bile leakage was  noted. Surgicel is placed the gallbladder fossa. All fluid and air were then evacuated from the abdominal cavity prior to removal of the trochars.  All wounds were gave normal saline. All wounds were injected with 0.5% Sensorcaine. The supraumbilical fascia was reapproximated using 0 Vicryl interrupted suture. All skin incisions were closed using staples. Betadine ointment dry sterile dressings were applied.  All tape and needle counts were correct at the end of the procedure. Patient was extubated in the operating room and went back to recovery room awake in stable condition.  Complications:  None  EBL:  Minimal  Specimen:  Gallbladder

## 2011-04-12 NOTE — Interval H&P Note (Signed)
History and Physical Interval Note:  04/12/2011 7:26 AM  Brandi Dunn  has presented today for surgery, with the diagnosis of Calculus of gallbladder with other cholecystitis, without mention of obstruction   The various methods of treatment have been discussed with the patient and family. After consideration of risks, benefits and other options for treatment, the patient has consented to  Procedure(s) (LRB): LAPAROSCOPIC CHOLECYSTECTOMY (N/A) as a surgical intervention .  The patients' history has been reviewed, patient examined, no change in status, stable for surgery.  I have reviewed the patients' chart and labs.  Questions were answered to the patient's satisfaction.     [name]

## 2011-04-12 NOTE — Addendum Note (Signed)
Addendum  created 04/12/11 0902 by Eaven Schwager Logan Basilia Stuckert, CRNA   Modules edited:Anesthesia Medication Administration    

## 2011-04-12 NOTE — Anesthesia Postprocedure Evaluation (Signed)
  Anesthesia Post-op Note  Patient: Brandi Dunn  Procedure(s) Performed: Procedure(s) (LRB): LAPAROSCOPIC CHOLECYSTECTOMY (N/A)  Patient Location: PACU  Anesthesia Type: General  Level of Consciousness: awake, alert  and oriented  Airway and Oxygen Therapy: Patient Spontanous Breathing and Patient connected to face mask oxygen  Post-op Pain: none  Post-op Assessment: Post-op Vital signs reviewed, Patient's Cardiovascular Status Stable, Respiratory Function Stable, Patent Airway and No signs of Nausea or vomiting  Post-op Vital Signs: Reviewed and stable  Complications: No apparent anesthesia complications

## 2011-04-12 NOTE — Transfer of Care (Signed)
Immediate Anesthesia Transfer of Care Note  Patient: Brandi Dunn  Procedure(s) Performed: Procedure(s) (LRB): LAPAROSCOPIC CHOLECYSTECTOMY (N/A)  Patient Location: PACU  Anesthesia Type: General  Level of Consciousness: awake and alert   Airway & Oxygen Therapy: Patient Spontanous Breathing and Patient connected to face mask oxygen  Post-op Assessment: Report given to PACU RN and Post -op Vital signs reviewed and stable  Post vital signs: Reviewed and stable  Complications: No apparent anesthesia complications

## 2011-04-12 NOTE — Preoperative (Signed)
Beta Blockers   Reason not to administer Beta Blockers:Not Applicable 

## 2011-04-12 NOTE — Addendum Note (Signed)
Addendum  created 04/12/11 0902 by Lenice Llamas Vaudie Engebretsen, CRNA   Modules edited:Anesthesia Medication Administration

## 2011-04-12 NOTE — Anesthesia Preprocedure Evaluation (Signed)
Anesthesia Evaluation  Patient identified by MRN, date of birth, ID band Patient awake    Reviewed: Allergy & Precautions, H&P , NPO status , Patient's Chart, lab work & pertinent test results  History of Anesthesia Complications Negative for: history of anesthetic complications  Airway Mallampati: III TM Distance: >3 FB Neck ROM: Full    Dental  (+) Teeth Intact   Pulmonary neg pulmonary ROS,  breath sounds clear to auscultation        Cardiovascular negative cardio ROS  Rhythm:Regular Rate:Normal     Neuro/Psych  Headaches,    GI/Hepatic GERD-  Medicated and Controlled,  Endo/Other    Renal/GU      Musculoskeletal   Abdominal   Peds  Hematology   Anesthesia Other Findings   Reproductive/Obstetrics                           Anesthesia Physical Anesthesia Plan  ASA: II  Anesthesia Plan: General   Post-op Pain Management:    Induction: Intravenous, Rapid sequence and Cricoid pressure planned  Airway Management Planned: Oral ETT  Additional Equipment:   Intra-op Plan:   Post-operative Plan: Extubation in OR  Informed Consent: I have reviewed the patients History and Physical, chart, labs and discussed the procedure including the risks, benefits and alternatives for the proposed anesthesia with the patient or authorized representative who has indicated his/her understanding and acceptance.     Plan Discussed with:   Anesthesia Plan Comments:         Anesthesia Quick Evaluation

## 2011-04-14 ENCOUNTER — Encounter (HOSPITAL_COMMUNITY): Payer: Self-pay | Admitting: General Surgery

## 2011-08-09 ENCOUNTER — Other Ambulatory Visit: Payer: Self-pay | Admitting: Obstetrics & Gynecology

## 2011-08-09 ENCOUNTER — Other Ambulatory Visit (HOSPITAL_COMMUNITY)
Admission: RE | Admit: 2011-08-09 | Discharge: 2011-08-09 | Disposition: A | Discharge status: Home / Self Care | Payer: Medicaid Other | Source: Ambulatory Visit | Attending: Obstetrics & Gynecology | Admitting: Obstetrics & Gynecology

## 2011-08-09 DIAGNOSIS — Z01419 Encounter for gynecological examination (general) (routine) without abnormal findings: Secondary | ICD-10-CM | POA: Insufficient documentation

## 2011-08-09 DIAGNOSIS — Z113 Encounter for screening for infections with a predominantly sexual mode of transmission: Secondary | ICD-10-CM | POA: Insufficient documentation

## 2012-09-05 ENCOUNTER — Emergency Department (HOSPITAL_COMMUNITY)
Admission: EM | Admit: 2012-09-05 | Discharge: 2012-09-05 | Disposition: A | Discharge status: Home / Self Care | Payer: BC Managed Care – PPO | Attending: Emergency Medicine | Admitting: Emergency Medicine

## 2012-09-05 ENCOUNTER — Emergency Department (HOSPITAL_COMMUNITY): Payer: BC Managed Care – PPO

## 2012-09-05 ENCOUNTER — Encounter (HOSPITAL_COMMUNITY): Payer: Self-pay

## 2012-09-05 DIAGNOSIS — Z8719 Personal history of other diseases of the digestive system: Secondary | ICD-10-CM | POA: Insufficient documentation

## 2012-09-05 DIAGNOSIS — N200 Calculus of kidney: Secondary | ICD-10-CM | POA: Insufficient documentation

## 2012-09-05 DIAGNOSIS — R112 Nausea with vomiting, unspecified: Secondary | ICD-10-CM | POA: Insufficient documentation

## 2012-09-05 DIAGNOSIS — R300 Dysuria: Secondary | ICD-10-CM | POA: Insufficient documentation

## 2012-09-05 DIAGNOSIS — Z3202 Encounter for pregnancy test, result negative: Secondary | ICD-10-CM | POA: Insufficient documentation

## 2012-09-05 DIAGNOSIS — G43909 Migraine, unspecified, not intractable, without status migrainosus: Secondary | ICD-10-CM | POA: Insufficient documentation

## 2012-09-05 LAB — COMPREHENSIVE METABOLIC PANEL
ALT: 9 U/L (ref 0–35)
AST: 13 U/L (ref 0–37)
Albumin: 4.2 g/dL (ref 3.5–5.2)
Alkaline Phosphatase: 48 U/L (ref 39–117)
BUN: 16 mg/dL (ref 6–23)
CO2: 26 mEq/L (ref 19–32)
Calcium: 10.3 mg/dL (ref 8.4–10.5)
Chloride: 104 mEq/L (ref 96–112)
Creatinine, Ser: 1 mg/dL (ref 0.50–1.10)
GFR calc Af Amer: 86 mL/min — ABNORMAL LOW (ref >90)
GFR calc non Af Amer: 74 mL/min — ABNORMAL LOW (ref >90)
Glucose, Bld: 97 mg/dL (ref 70–99)
Potassium: 4.1 mEq/L (ref 3.5–5.1)
Sodium: 138 mEq/L (ref 135–145)
Total Bilirubin: 0.3 mg/dL (ref 0.3–1.2)
Total Protein: 7.9 g/dL (ref 6.0–8.3)

## 2012-09-05 LAB — CBC WITH DIFFERENTIAL/PLATELET
Basophils Absolute: 0 10*3/uL (ref 0.0–0.1)
Basophils Relative: 0 % (ref 0–1)
Eosinophils Absolute: 0 10*3/uL (ref 0.0–0.7)
Eosinophils Relative: 1 % (ref 0–5)
HCT: 40 % (ref 36.0–46.0)
Hemoglobin: 13.1 g/dL (ref 12.0–15.0)
Lymphocytes Relative: 46 % (ref 12–46)
Lymphs Abs: 2.9 10*3/uL (ref 0.7–4.0)
MCH: 28.9 pg (ref 26.0–34.0)
MCHC: 32.8 g/dL (ref 30.0–36.0)
MCV: 88.3 fL (ref 78.0–100.0)
Monocytes Absolute: 0.3 10*3/uL (ref 0.1–1.0)
Monocytes Relative: 5 % (ref 3–12)
Neutro Abs: 3.1 10*3/uL (ref 1.7–7.7)
Neutrophils Relative %: 48 % (ref 43–77)
Platelets: 208 10*3/uL (ref 150–400)
RBC: 4.53 MIL/uL (ref 3.87–5.11)
RDW: 12.6 % (ref 11.5–15.5)
WBC: 6.4 10*3/uL (ref 4.0–10.5)

## 2012-09-05 LAB — URINALYSIS, ROUTINE W REFLEX MICROSCOPIC
Bilirubin Urine: NEGATIVE
Glucose, UA: NEGATIVE mg/dL
Ketones, ur: 15 mg/dL — AB
Leukocytes, UA: NEGATIVE
Nitrite: NEGATIVE
Specific Gravity, Urine: >1.03 — ABNORMAL HIGH (ref 1.005–1.030)
Urobilinogen, UA: 0.2 mg/dL (ref 0.0–1.0)
pH: 6 (ref 5.0–8.0)

## 2012-09-05 LAB — PREGNANCY, URINE: Preg Test, Ur: NEGATIVE

## 2012-09-05 LAB — URINE MICROSCOPIC-ADD ON

## 2012-09-05 IMAGING — CT CT ABD-PELV W/O CM
2 of 3 series · 9 of 46 positions shown, 11 images · non-contrast
Comparison: CT abdomen and pelvis [DATE] and ultrasound
abdomen [DATE]

CLINICAL DATA: Abdominal pain

CT ABDOMEN AND PELVIS WITHOUT CONTRAST
TECHNIQUE: Multidetector CT imaging of the abdomen and pelvis was
performed following the standard protocol without oral or
intravenous contrast.

[Series 4: mpr coronal (id) · coronal · 0.70mm/px · 8 of 79 slices shown, 9 images]
[im 9/79  soft-tissue]
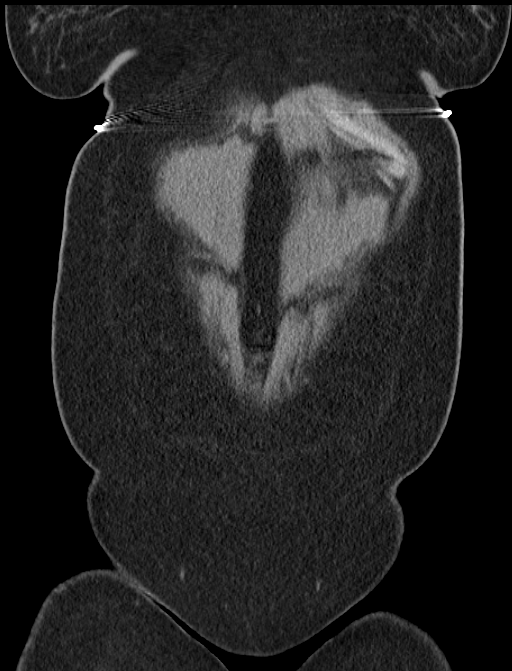
[im 9/79  bone]
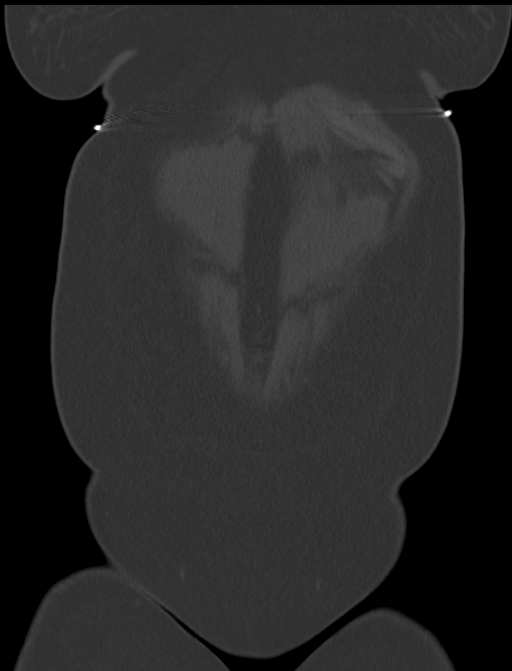
[im 18/79  soft-tissue]
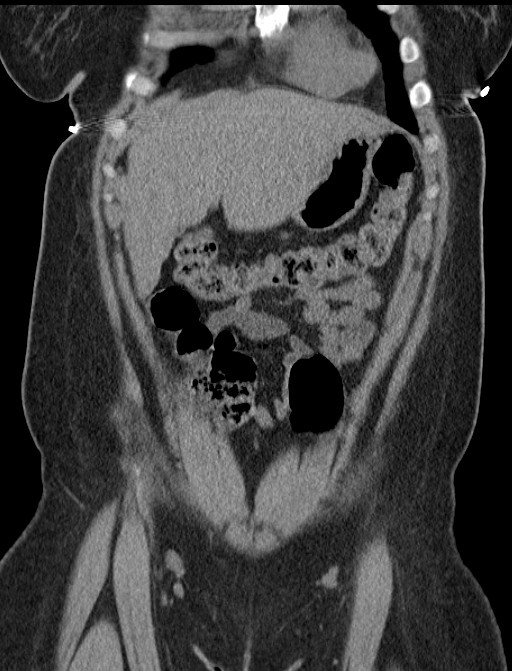
[im 27/79  soft-tissue]
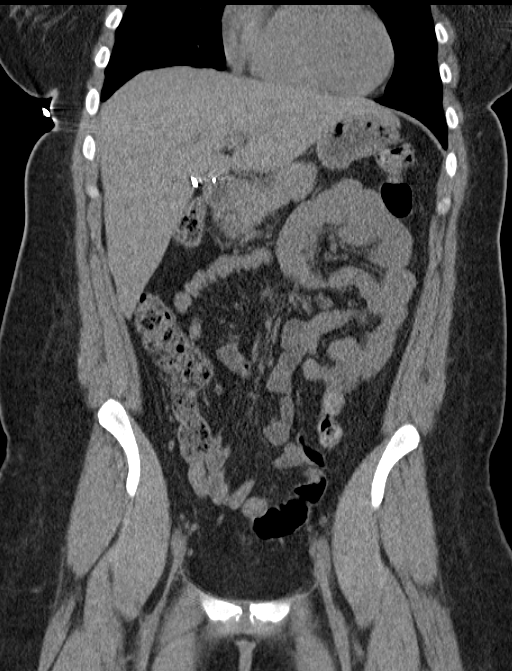
[im 35/79  soft-tissue]
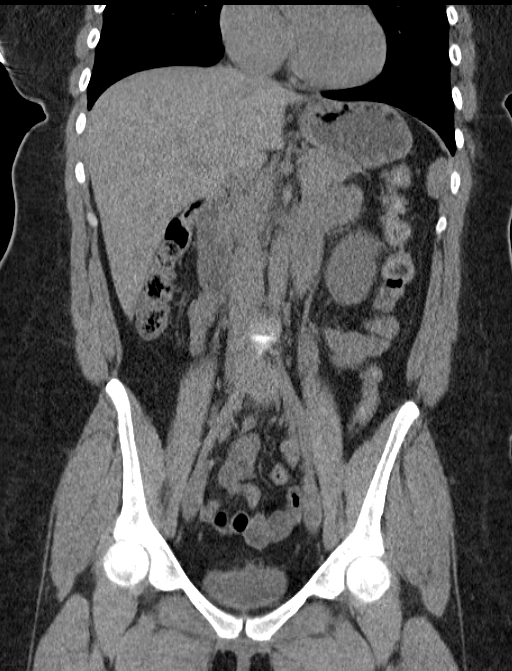
[im 44/79  soft-tissue]
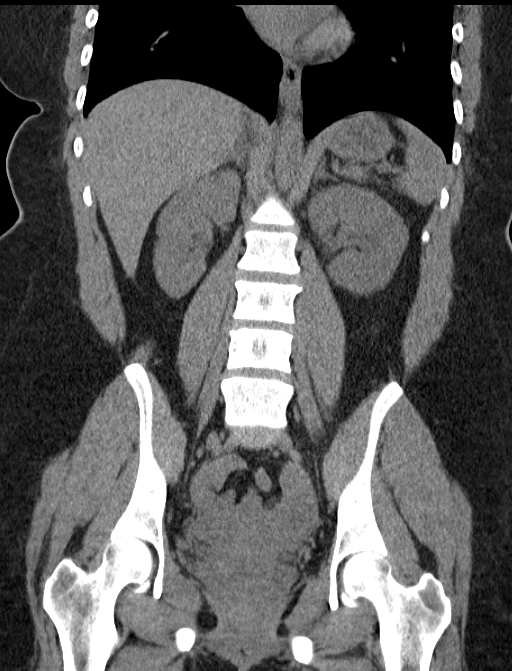
[im 53/79  soft-tissue]
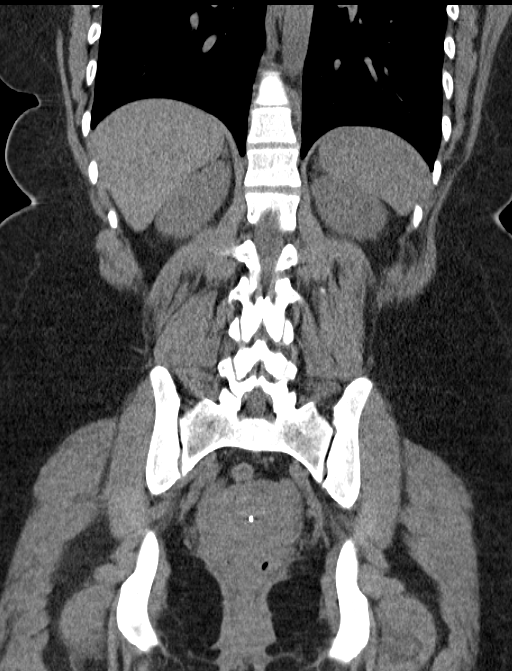
[im 61/79  soft-tissue]
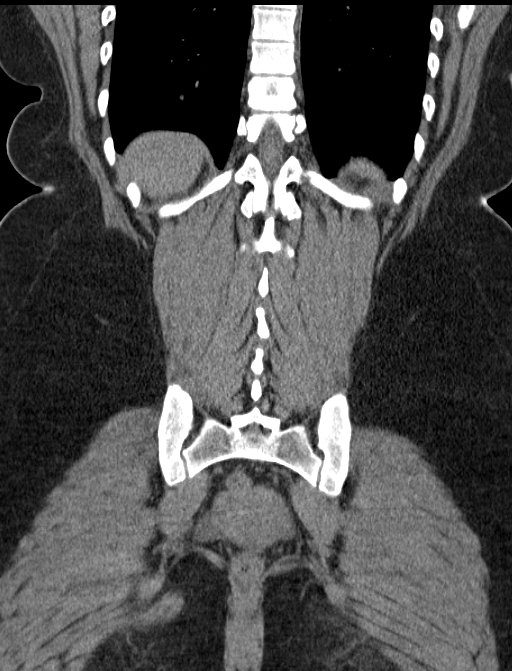
[im 70/79  soft-tissue]
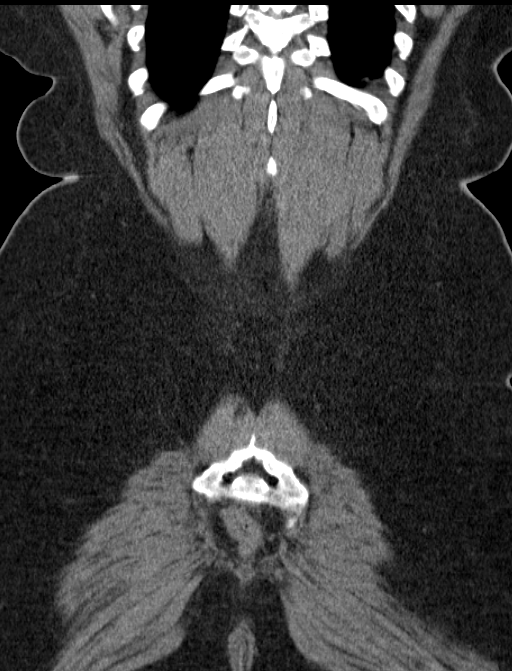

[Series 5: mpr sagittal (id) · sagittal · 0.63mm/px · 1 of 101 slices shown, 2 images]
[im 34/101  soft-tissue]
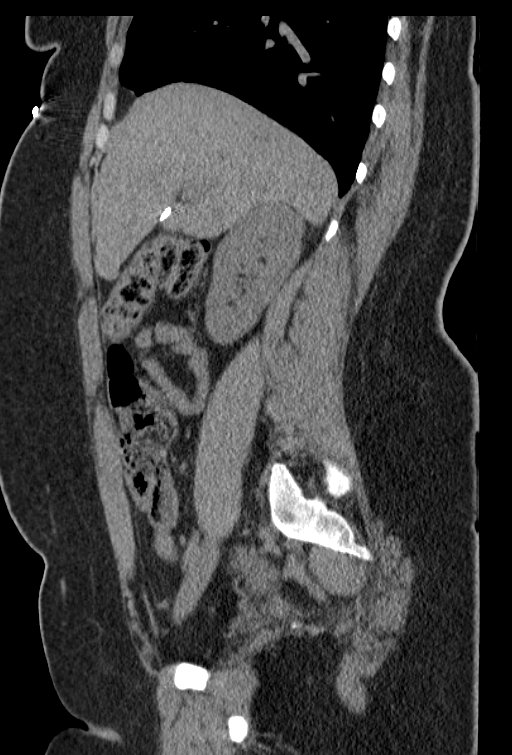
[im 34/101  bone]
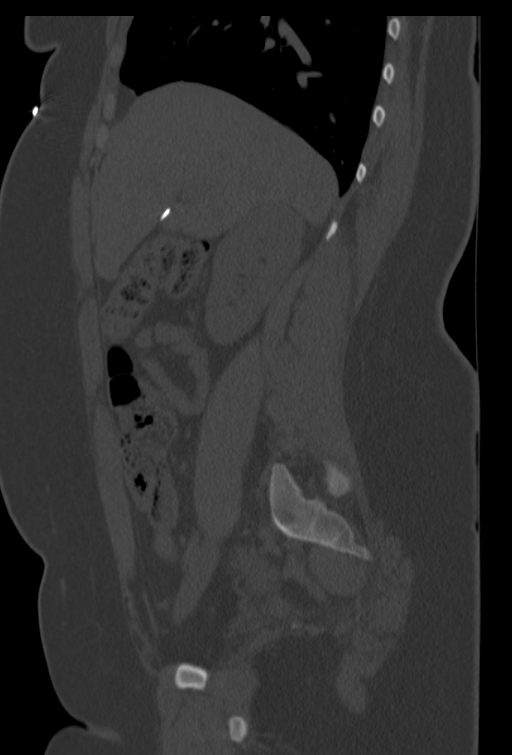

[9 of 46 positions shown; findings below may reference images not displayed]

FINDINGS: Lung bases are clear. There is a small hiatal hernia.

No focal liver lesions are identified on this non intravenous
contrast enhanced study.  Gallbladder is absent.  There is no
biliary duct dilatation.

Spleen, pancreas, and adrenals appear normal.

Right kidney appears normal without mass, calculus, or
hydronephrosis.  On the left, there is mild to moderate
hydronephrosis.  There is no intrarenal mass or calculus on the
left.  There is a 5 mm calculus on the left just beyond the
ureteropelvic junction causing obstruction on the left.  No other
ureteral calculi are identified on either side.

There is a small ventral hernia containing only fat.

In the pelvis, there is an intrauterine device in the uterus.
There is no pelvic mass or fluid collection.  Appendix appears
normal.

There is no bowel obstruction.  No free air or portal venous air.
There is no ascites, adenopathy, or abscess in the pelvis.  Aorta
is nonaneurysmal.  There are no blastic or lytic bone lesions.
IMPRESSION: 5 mm calculus just distal to the left ureteropelvic junction with
mild to moderate hydronephrosis on the left.

Intrauterine device positioned in the uterus.

Small ventral hernia containing only fat.

Gallbladder absent.

Study otherwise unremarkable.

## 2012-09-05 MED ORDER — KETOROLAC TROMETHAMINE 30 MG/ML IJ SOLN
30.0000 mg | Freq: Once | INTRAMUSCULAR | Status: AC
  Administered 2012-09-05: 30 mg via INTRAVENOUS
  Filled 2012-09-05: qty 1

## 2012-09-05 MED ORDER — OXYCODONE-ACETAMINOPHEN 5-325 MG PO TABS: 1.0000 | ORAL_TABLET | ORAL | Status: DC | PRN

## 2012-09-05 MED ORDER — FENTANYL CITRATE 0.05 MG/ML IJ SOLN
50.0000 ug | Freq: Once | INTRAMUSCULAR | Status: AC
  Administered 2012-09-05: 50 ug via INTRAVENOUS
  Filled 2012-09-05: qty 2

## 2012-09-05 MED ORDER — HYDROMORPHONE HCL PF 1 MG/ML IJ SOLN
1.0000 mg | INTRAMUSCULAR | Status: AC
  Administered 2012-09-05: 1 mg via INTRAVENOUS
  Filled 2012-09-05: qty 1

## 2012-09-05 MED ORDER — ONDANSETRON HCL 4 MG PO TABS: 4.0000 mg | ORAL_TABLET | Freq: Four times a day (QID) | ORAL | Status: DC

## 2012-09-05 MED ORDER — ONDANSETRON HCL 4 MG/2ML IJ SOLN
4.0000 mg | Freq: Once | INTRAMUSCULAR | Status: AC
  Administered 2012-09-05: 4 mg via INTRAVENOUS
  Filled 2012-09-05: qty 2

## 2012-09-05 NOTE — ED Notes (Signed)
Pt reports faint pain in left side that started around 0600.  Reports pain has gotten more severe and reports n/v.  Denies diarrhea.  Denies any abnormal vaginal bleeding or discharge.  Reports history of kidney stones.

## 2012-09-07 NOTE — ED Provider Notes (Signed)
CSN: [id number]     Arrival date & time 09/05/12  4098 History     First MD Initiated Contact with Patient 09/05/12 0930     Chief Complaint  Patient presents with  . Abdominal Pain   (Consider location/radiation/quality/duration/timing/severity/associated sxs/prior Treatment) HPI Comments: Brandi Dunn is a 32 y.o. female who presents to the Emergency Department complaining of left flank and lower abdominal pain that began suddenly at 6:00 am.  She also reports having nausea and vomiting x 2-3 times.  Describes the pain as waxing and waning.  She denies fever, vaginal bleeding or discharge.  She does report a hx of kidney stones and states pain feels similar but at times more intense.    Patient is a 32 y.o. female presenting with abdominal pain. The history is provided by the patient.  Abdominal Pain This is a new problem. Episode onset: at 6:00 am on the morning of arrival. The problem occurs constantly. The problem has been gradually worsening. Associated symptoms include abdominal pain, a change in bowel habit, nausea, urinary symptoms and vomiting. Pertinent negatives include no arthralgias, chest pain, chills, congestion, fever, headaches, joint swelling, neck pain, numbness, rash, sore throat, swollen glands, vertigo, visual change or weakness. Nothing aggravates the symptoms. She has tried NSAIDs for the symptoms. The treatment provided no relief.    Past Medical History  Diagnosis Date  . Migraine     last migraine 03/24/11  . GERD (gastroesophageal reflux disease)   . H/O renal calculi    Past Surgical History  Procedure Laterality Date  . Dilation and curettage of uterus  2000?    x2, [facility]  . Cholecystectomy  04/12/2011    Procedure: LAPAROSCOPIC CHOLECYSTECTOMY;  Surgeon: Dalia Heading, MD;  Location: [facility];  Service: General;  Laterality: N/A;   Family History  Problem Relation Age of Onset  . Anesthesia problems Neg Hx   . Hypotension Neg Hx   . Malignant  hyperthermia Neg Hx   . Pseudochol deficiency Neg Hx    History  Substance Use Topics  . Smoking status: Not on file  . Smokeless tobacco: Not on file  . Alcohol Use: No   OB History   Grav Para Term Preterm Abortions TAB SAB Ect Mult Living                 Review of Systems  Constitutional: Negative for fever, chills and appetite change.  HENT: Negative for congestion, sore throat and neck pain.   Respiratory: Negative for shortness of breath.   Cardiovascular: Negative for chest pain.  Gastrointestinal: Positive for nausea, vomiting, abdominal pain and change in bowel habit. Negative for blood in stool and abdominal distention.  Genitourinary: Positive for dysuria and flank pain. Negative for urgency, frequency, hematuria, decreased urine volume, vaginal bleeding, vaginal discharge, difficulty urinating and genital sores.  Musculoskeletal: Negative for back pain, joint swelling and arthralgias.  Skin: Negative for color change and rash.  Neurological: Negative for dizziness, vertigo, weakness, numbness and headaches.  Hematological: Negative for adenopathy.  All other systems reviewed and are negative.    Allergies  Review of patient's allergies indicates no known allergies.  Home Medications   Current Outpatient Rx  Name  Route  Sig  Dispense  Refill  . ibuprofen (ADVIL,MOTRIN) 200 MG tablet   Oral   Take 400 mg by mouth every 8 (eight) hours as needed for pain. For pain         . ondansetron (ZOFRAN) 4 MG  tablet   Oral   Take 1 tablet (4 mg total) by mouth every 6 (six) hours.   12 tablet   0   . oxyCODONE-acetaminophen (PERCOCET/ROXICET) 5-325 MG per tablet   Oral   Take 1 tablet by mouth every 4 (four) hours as needed for pain.   24 tablet   0   . rizatriptan (MAXALT) 10 MG tablet   Oral   Take 10 mg by mouth as needed. May repeat in 2 hours if needed. For migraines          BP 124/79  Pulse 84  Temp(Src) 98.5 F (36.9 C) (Oral)  Resp 18  Ht 5'  5" (1.651 m)  Wt 223 lb (101.152 kg)  BMI 37.11 kg/m2  SpO2 100%  LMP 08/14/2012 Physical Exam  Nursing note and vitals reviewed. Constitutional: She is oriented to person, place, and time. She appears well-developed and well-nourished. No distress.  HENT:  Head: Normocephalic and atraumatic.  Mouth/Throat: Oropharynx is clear and moist.  Neck: Normal range of motion. Neck supple.  Cardiovascular: Normal rate, regular rhythm, normal heart sounds and intact distal pulses.   No murmur heard. Pulmonary/Chest: Effort normal and breath sounds normal. No respiratory distress.  Abdominal: Soft. Bowel sounds are normal. She exhibits no distension and no mass. There is tenderness in the left lower quadrant. There is no rigidity, no rebound, no guarding, no CVA tenderness and no tenderness at McBurney's point.    Musculoskeletal: She exhibits no edema.       Thoracic back: She exhibits tenderness. She exhibits no bony tenderness, no spasm and normal pulse.       Back:  Describes pain at the left flank and LLQ of the abdomen.  No CVA tenderness.    Neurological: She is alert and oriented to person, place, and time. She exhibits normal muscle tone. Coordination normal.  Skin: Skin is warm and dry.    ED Course   Procedures (including critical care time)  Labs Reviewed  URINALYSIS, ROUTINE W REFLEX MICROSCOPIC - Abnormal; Notable for the following:    Specific Gravity, Urine >1.030 (*)    Hgb urine dipstick LARGE (*)    Ketones, ur 15 (*)    Protein, ur TRACE (*)    All other components within normal limits  COMPREHENSIVE METABOLIC PANEL - Abnormal; Notable for the following:    GFR calc non Af Amer 74 (*)    GFR calc Af Amer 86 (*)    All other components within normal limits  URINE MICROSCOPIC-ADD ON - Abnormal; Notable for the following:    Squamous Epithelial / LPF FEW (*)    All other components within normal limits  CBC WITH DIFFERENTIAL  PREGNANCY, URINE    Ct Abdomen  Pelvis Wo Contrast  09/05/2012   *RADIOLOGY REPORT*  Clinical Data: Abdominal pain  CT ABDOMEN AND PELVIS WITHOUT CONTRAST  Technique:  Multidetector CT imaging of the abdomen and pelvis was performed following the standard protocol without oral or intravenous contrast.  Comparison: CT abdomen and pelvis January 07, 2018 and ultrasound abdomen March 22, 2011  Findings:  Lung bases are clear. There is a small hiatal hernia.  No focal liver lesions are identified on this non intravenous contrast enhanced study.  Gallbladder is absent.  There is no biliary duct dilatation.  Spleen, pancreas, and adrenals appear normal.  Right kidney appears normal without mass, calculus, or hydronephrosis.  On the left, there is mild to moderate hydronephrosis.  There is no  intrarenal mass or calculus on the left.  There is a 5 mm calculus on the left just beyond the ureteropelvic junction causing obstruction on the left.  No other ureteral calculi are identified on either side.  There is a small ventral hernia containing only fat.  In the pelvis, there is an intrauterine device in the uterus. There is no pelvic mass or fluid collection.  Appendix appears normal.  There is no bowel obstruction.  No free air or portal venous air. There is no ascites, adenopathy, or abscess in the pelvis.  Aorta is nonaneurysmal.  There are no blastic or lytic bone lesions.  IMPRESSION: 5 mm calculus just distal to the left ureteropelvic junction with mild to moderate hydronephrosis on the left.  Intrauterine device positioned in the uterus.  Small ventral hernia containing only fat.  Gallbladder absent.  Study otherwise unremarkable.   Original Report Authenticated By: Bretta Bang, M.D.    1. Kidney stone on left side     MDM    Patient is feeling better after IVF's, dilaudid, zofran and toradol.  No vomiting during ed stay.  VSS.  Pt advised of CT findings and lab results.  She agrees to strain all urine and given referral for urology.   She appears stable for d/c  Reema Chick L. Trisha Mangle, PA-C 09/07/12 1333

## 2012-09-09 ENCOUNTER — Other Ambulatory Visit (HOSPITAL_COMMUNITY): Payer: Self-pay | Admitting: Urology

## 2012-09-09 ENCOUNTER — Ambulatory Visit (HOSPITAL_COMMUNITY)
Admission: RE | Admit: 2012-09-09 | Discharge: 2012-09-09 | Disposition: A | Discharge status: Home / Self Care | Payer: BC Managed Care – PPO | Source: Ambulatory Visit | Attending: Urology | Admitting: Urology

## 2012-09-09 DIAGNOSIS — R1032 Left lower quadrant pain: Secondary | ICD-10-CM | POA: Insufficient documentation

## 2012-09-09 DIAGNOSIS — N201 Calculus of ureter: Secondary | ICD-10-CM

## 2012-09-09 IMAGING — CR DG ABDOMEN 1V
3 series · 3 of 3 positions shown · non-contrast
Comparison: CT, [DATE]

CLINICAL DATA: Left ureteral calculus with abdominal pain on the
left side since [REDACTED].

ABDOMEN - 1 VIEW

[view not recorded (1 of 3)]
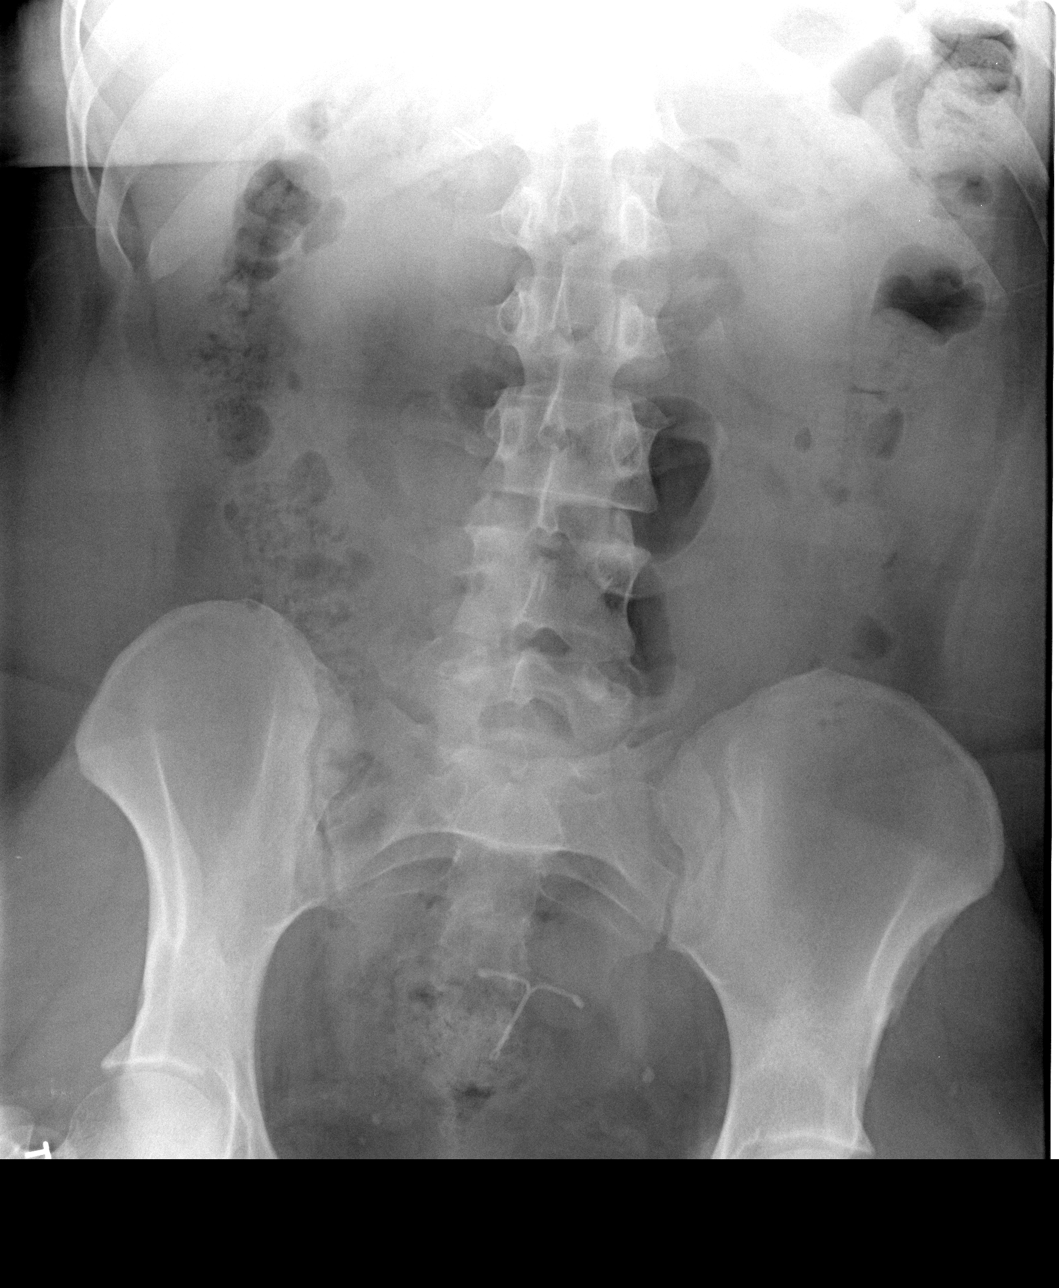

[view not recorded (2 of 3)]
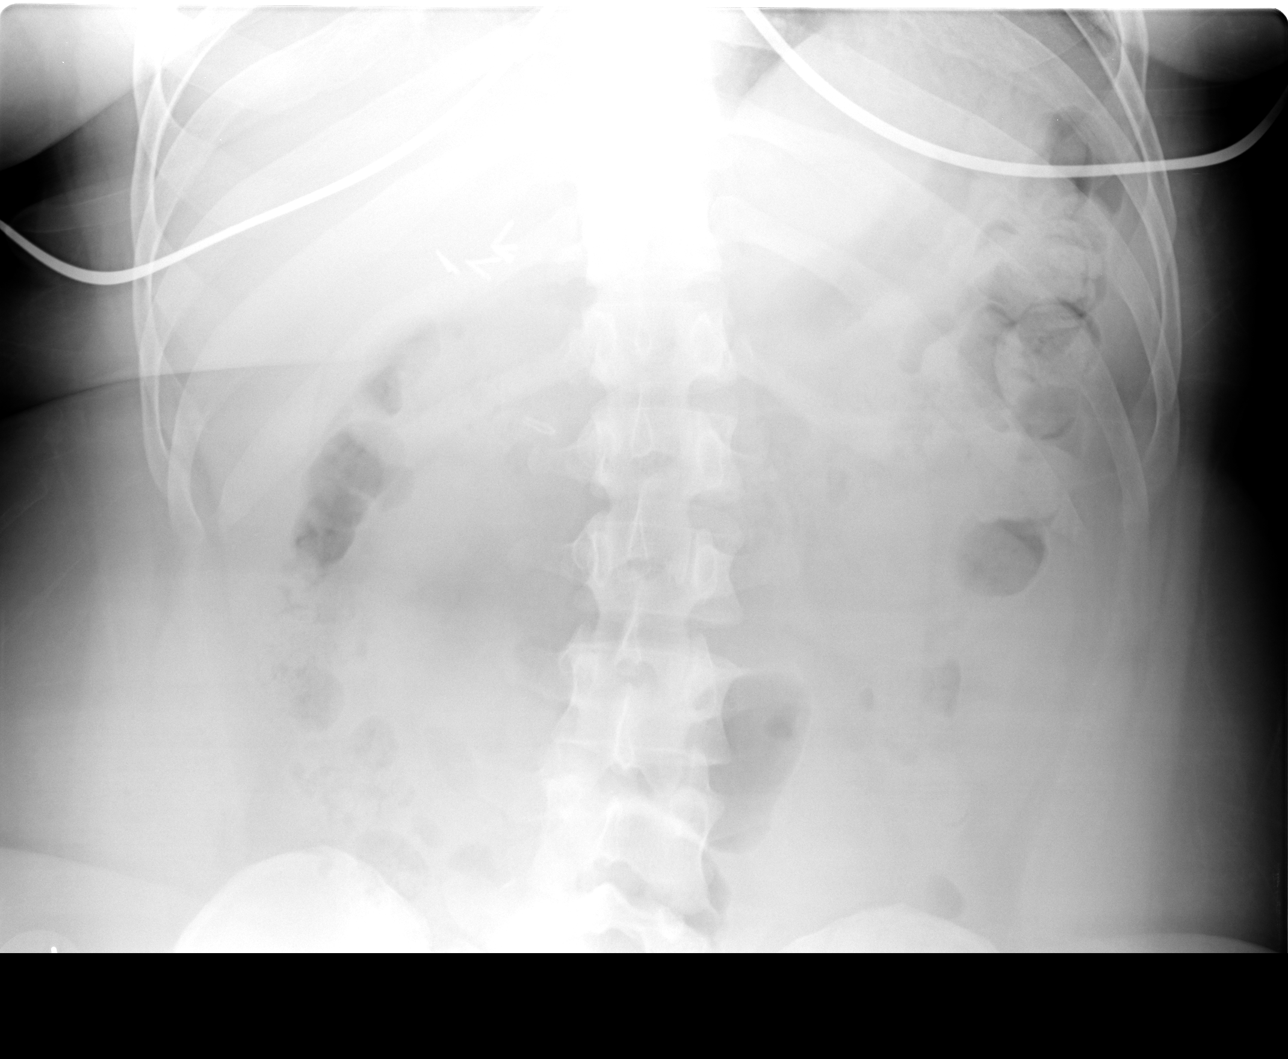

[view not recorded (3 of 3)]
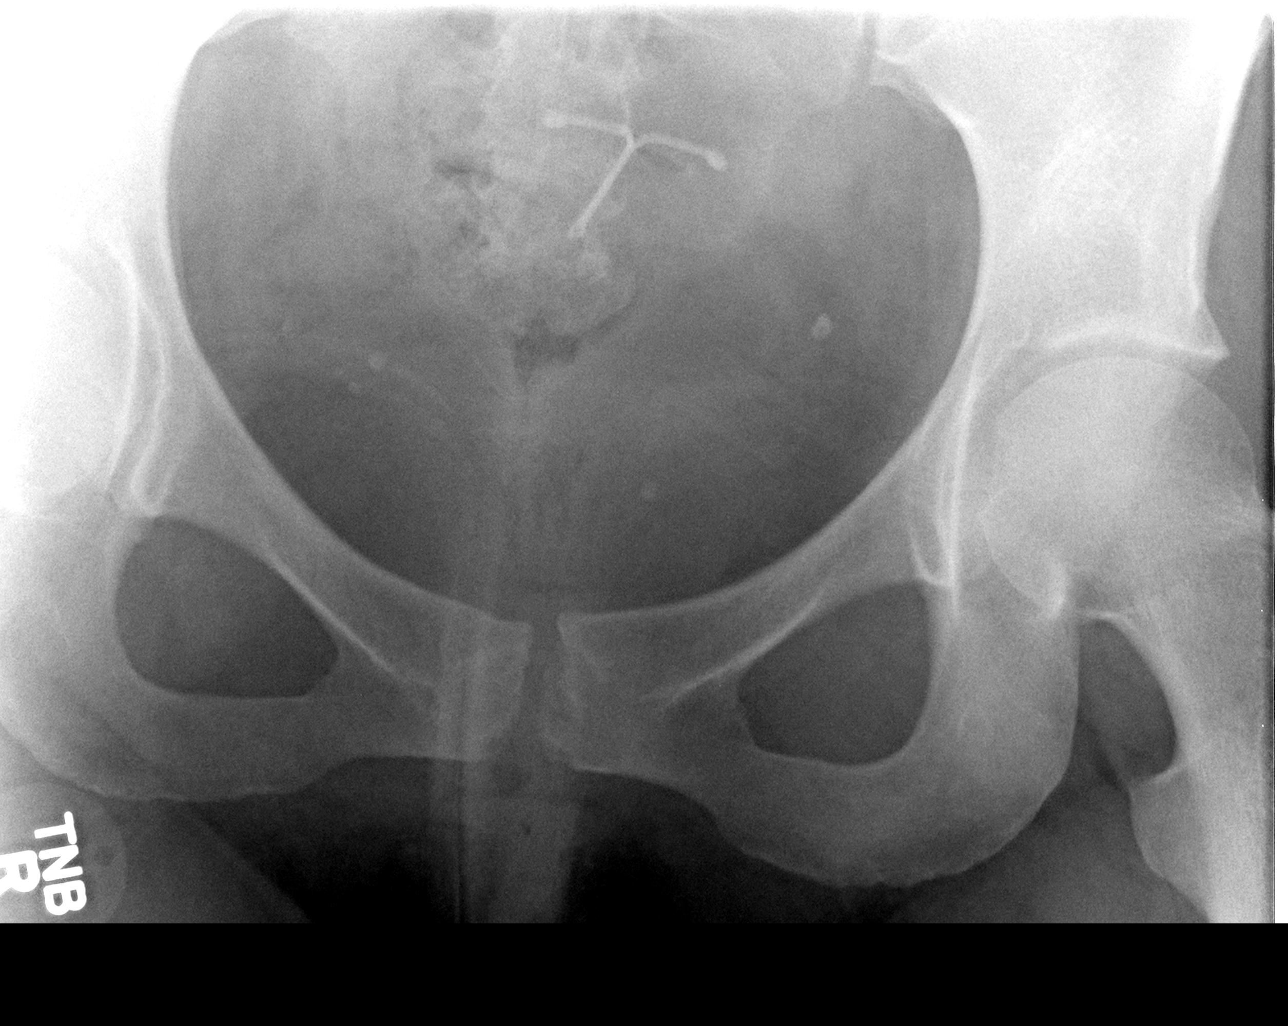

[3 of 3 positions shown; findings below may reference images not displayed]

FINDINGS: There is a calculus in the left mid pelvis that does not
have a correlate on the prior CT and is therefore most likely the
left ureteral calculus that has progressed into the distal ureter.
Three other lower pelvic calculi are seen consistent with
phleboliths.

No evidence of a renal stone and no other evidence of a ureteral
stone.

Surgical clips in the upper quadrant are consistent with a prior
cholecystectomy.  The soft tissues are otherwise unremarkable.

No significant bony abnormality.
IMPRESSION: Calculus in the left pelvis is consistent with the 7 mm stone noted
in the proximal ureter on the prior CT.  It now lies in the pelvis
consistent with it having passed into the distal ureter.

## 2012-09-10 ENCOUNTER — Encounter (HOSPITAL_COMMUNITY): Payer: Self-pay | Admitting: Anesthesiology

## 2012-09-10 ENCOUNTER — Ambulatory Visit (HOSPITAL_COMMUNITY): Payer: BC Managed Care – PPO

## 2012-09-10 ENCOUNTER — Encounter (HOSPITAL_COMMUNITY)
Admission: RE | Disposition: A | Discharge status: Home / Self Care | Payer: Self-pay | Source: Ambulatory Visit | Attending: Urology

## 2012-09-10 ENCOUNTER — Encounter (HOSPITAL_COMMUNITY): Payer: Self-pay | Admitting: *Deleted

## 2012-09-10 ENCOUNTER — Ambulatory Visit (HOSPITAL_COMMUNITY): Payer: BC Managed Care – PPO | Admitting: Anesthesiology

## 2012-09-10 ENCOUNTER — Ambulatory Visit (HOSPITAL_COMMUNITY)
Admission: RE | Admit: 2012-09-10 | Discharge: 2012-09-10 | Disposition: A | Discharge status: Home / Self Care | Payer: BC Managed Care – PPO | Source: Ambulatory Visit | Attending: Urology | Admitting: Urology

## 2012-09-10 DIAGNOSIS — N201 Calculus of ureter: Secondary | ICD-10-CM | POA: Insufficient documentation

## 2012-09-10 HISTORY — PX: CYSTOSCOPY W/ URETERAL STENT PLACEMENT: SHX1429

## 2012-09-10 HISTORY — PX: STONE EXTRACTION WITH BASKET: SHX5318

## 2012-09-10 IMAGING — RF DG RETROGRADE PYELOGRAM
1 series · 7 of 7 positions shown · non-contrast
Comparison: X-ray on [DATE]

CLINICAL DATA: Left ureteral calculus.

INTRAOPERATIVE LEFT RETROGRADE UROGRAPHY
TECHNIQUE: Images were obtained with the C-arm fluoroscopic device
intraoperatively and submitted for interpretation post-operatively.
Please see the procedural report for the amount of contrast and the
fluoroscopy time utilized.

[Series 1: run · 4 acquisitions, 7 frames shown]
[im 1/4]
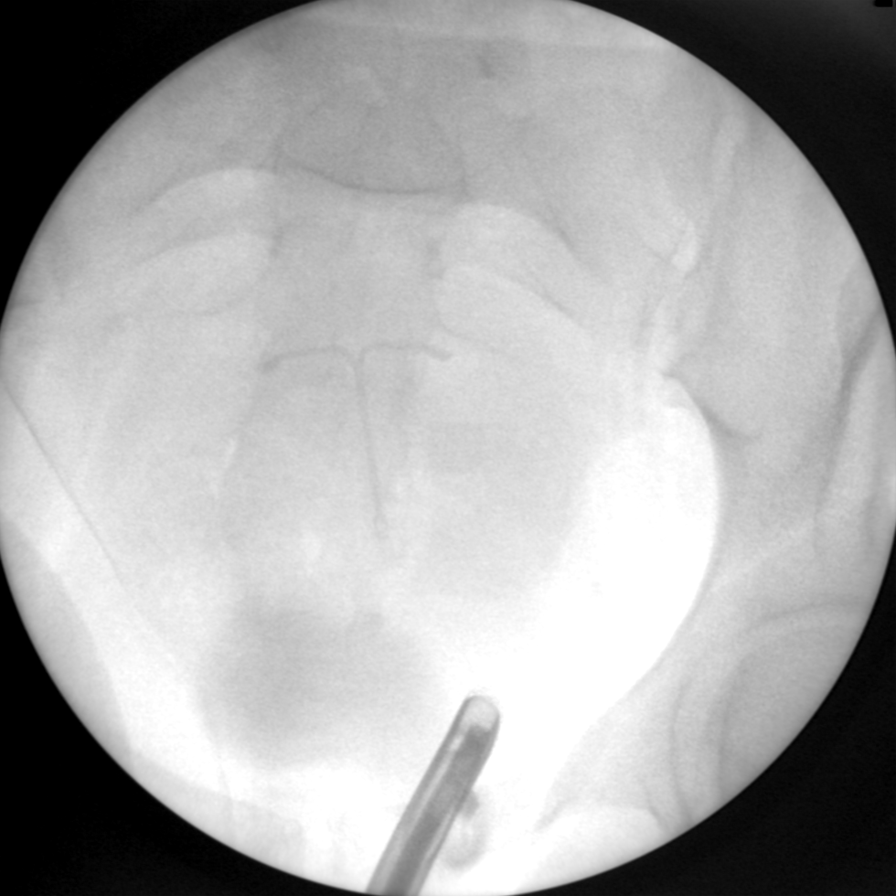
[im 2/4]
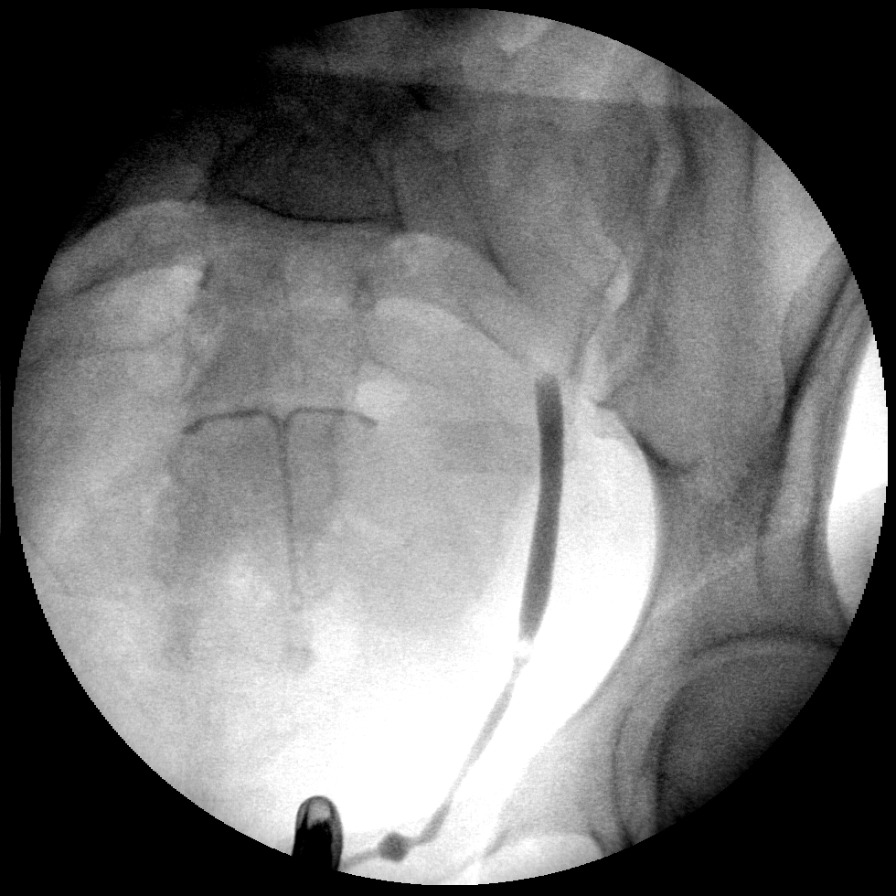
[im 2/4]
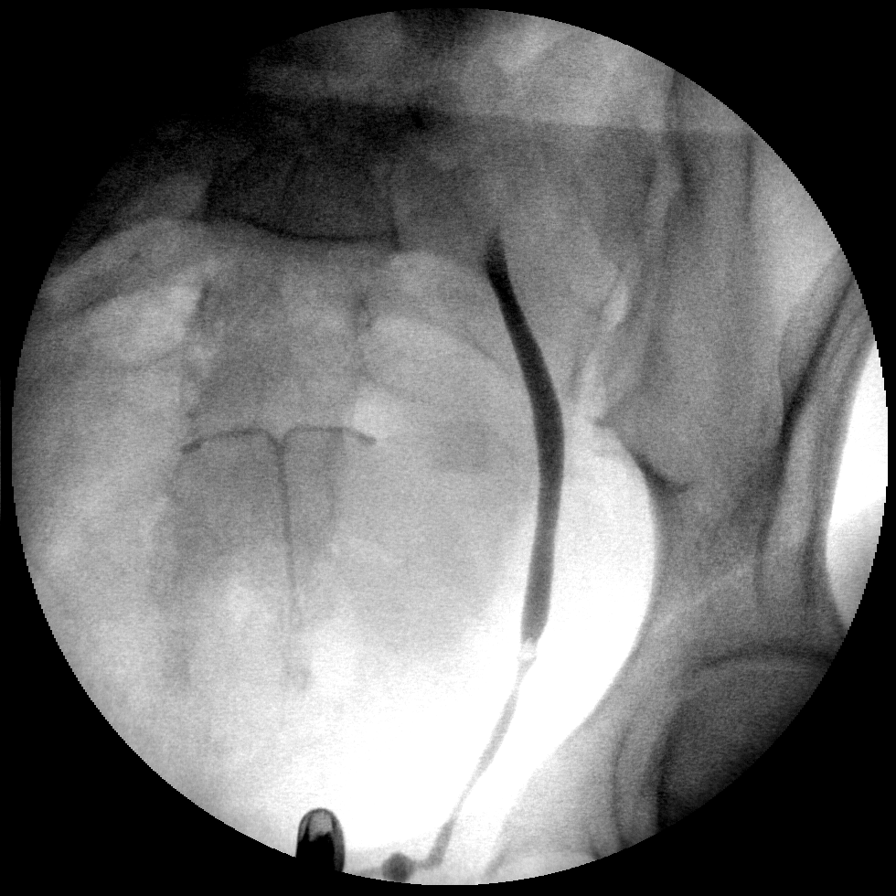
[im 2/4]
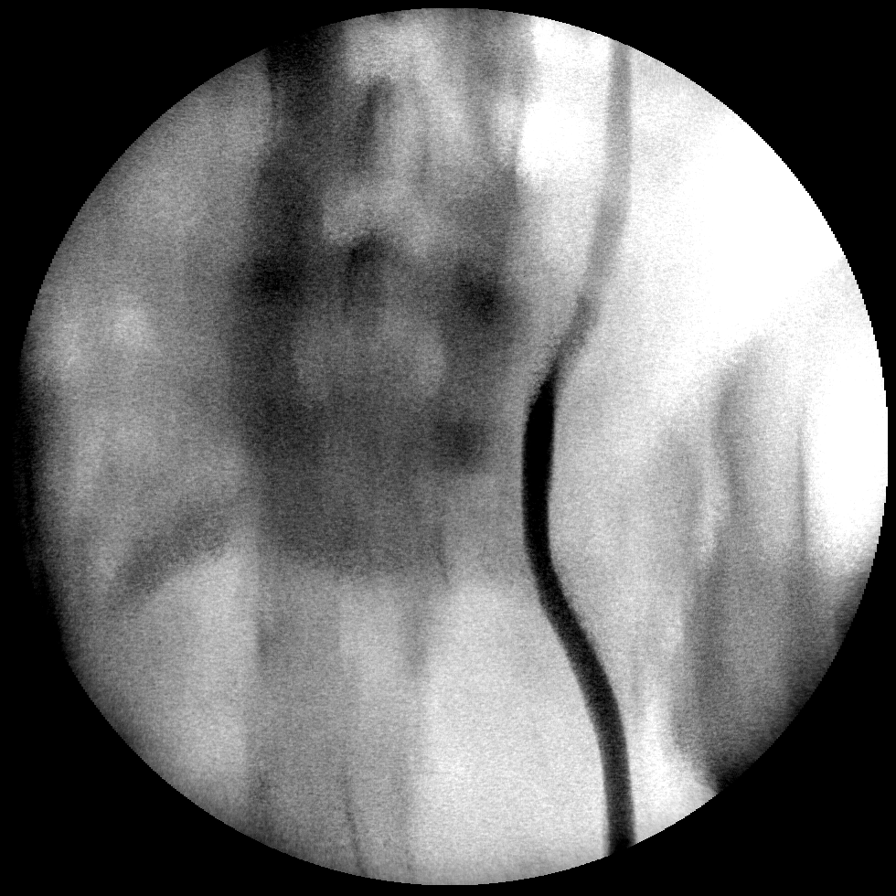
[im 2/4]
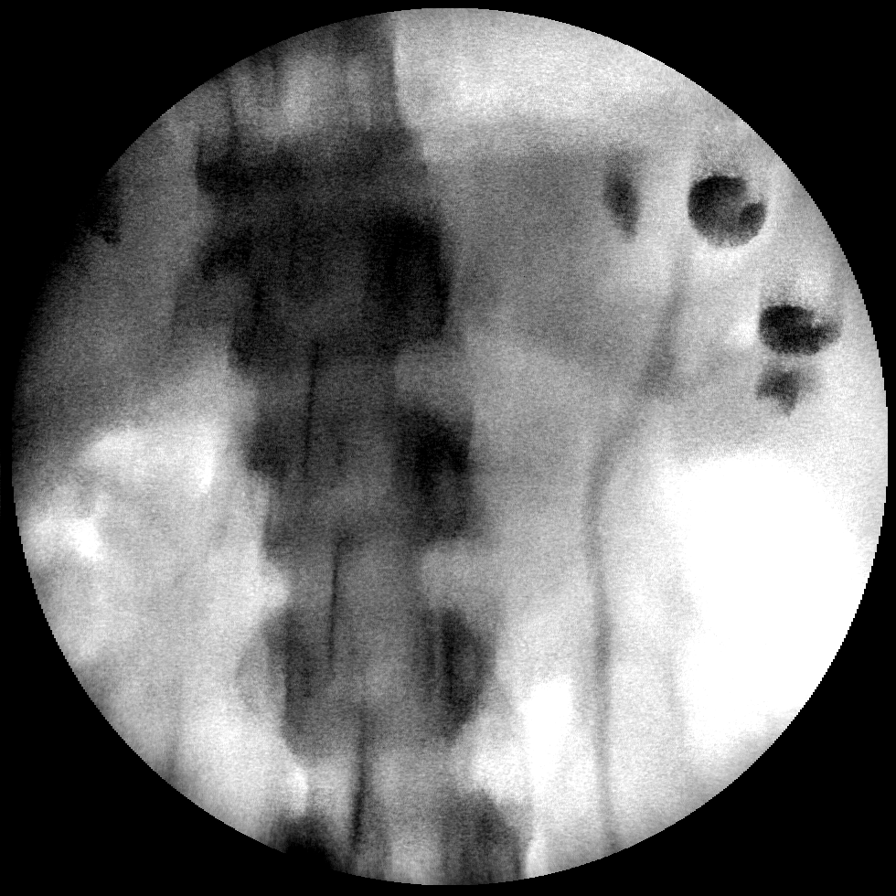
[im 3/4]
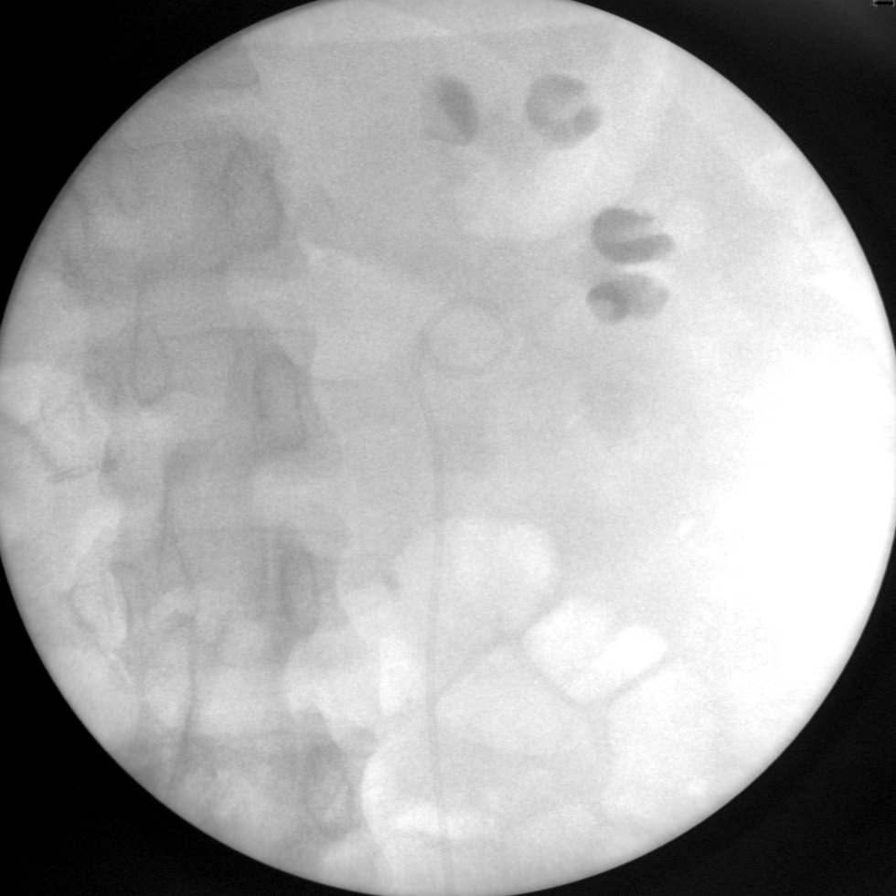
[im 4/4]
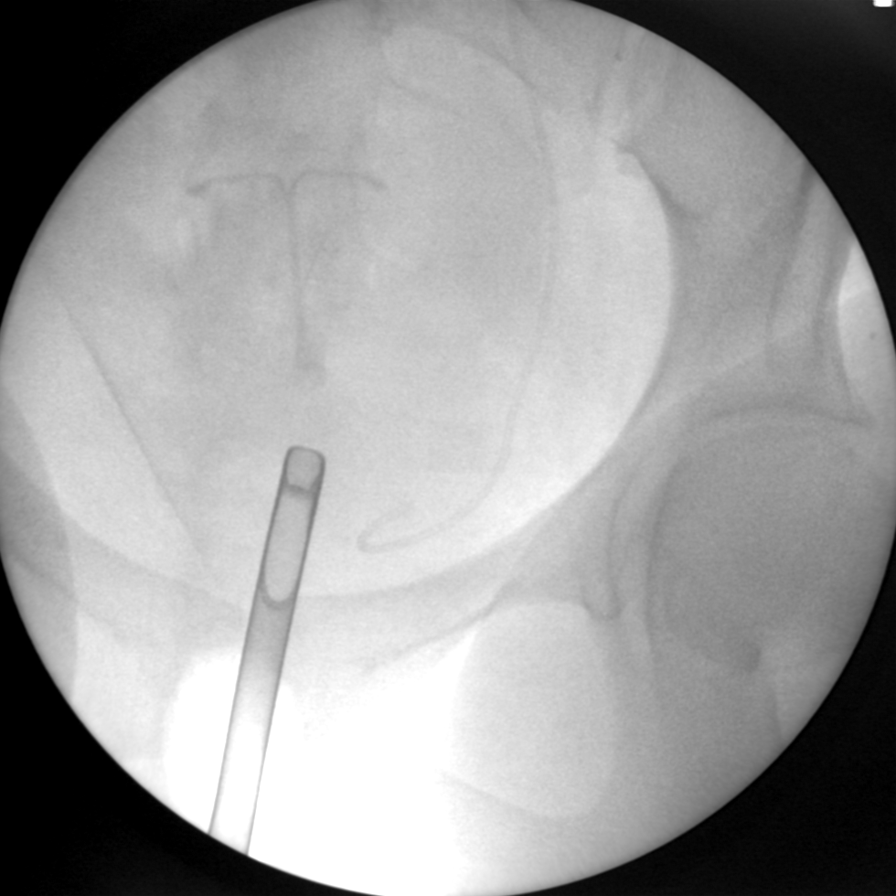

[7 of 7 positions shown; findings below may reference images not displayed]

FINDINGS: After cannulation of the distal left ureter by a
cystoscope, injection of contrast material confirms the presence of
a focal calculus in the distal ureter.  Contrast was able to be
injected around the calculus with the rest of the ureter showing a
normal appearance.  The renal collecting system is unremarkable in
appearance.

Additional imaging demonstrates removal of the distal left ureteral
calculus.  Retrograde placement of a ureteral stent was performed
extending from the bladder up to the renal pelvis.
IMPRESSION: Imaging obtained during left ureteral stone extraction
demonstrating a normal appearance to the renal collecting system.
Contrast was able to be injected around the calculus.  A ureteral
stent appears well positioned on completion.

## 2012-09-10 SURGERY — CYSTOSCOPY, WITH RETROGRADE PYELOGRAM AND URETERAL STENT INSERTION
Anesthesia: General | Laterality: Left | Wound class: Clean Contaminated

## 2012-09-10 MED ORDER — ONDANSETRON HCL 4 MG/2ML IJ SOLN
4.0000 mg | Freq: Once | INTRAMUSCULAR | Status: AC
  Administered 2012-09-10: 4 mg via INTRAVENOUS

## 2012-09-10 MED ORDER — SODIUM CHLORIDE 0.9 % IR SOLN
Status: DC | PRN
  Administered 2012-09-10 (×3): 3000 mL via INTRAVESICAL

## 2012-09-10 MED ORDER — FENTANYL CITRATE 0.05 MG/ML IJ SOLN
INTRAMUSCULAR | Status: AC
  Filled 2012-09-10: qty 2

## 2012-09-10 MED ORDER — IOHEXOL 350 MG/ML SOLN
INTRAVENOUS | Status: DC | PRN
  Administered 2012-09-10: 10 mL

## 2012-09-10 MED ORDER — LACTATED RINGERS IV SOLN
INTRAVENOUS | Status: DC
  Administered 2012-09-10 (×2): via INTRAVENOUS

## 2012-09-10 MED ORDER — STERILE WATER FOR IRRIGATION IR SOLN
Status: DC | PRN
  Administered 2012-09-10: 1000 mL

## 2012-09-10 MED ORDER — PROPOFOL 10 MG/ML IV EMUL
INTRAVENOUS | Status: AC
  Filled 2012-09-10: qty 20

## 2012-09-10 MED ORDER — FENTANYL CITRATE 0.05 MG/ML IJ SOLN
25.0000 ug | INTRAMUSCULAR | Status: DC | PRN
  Administered 2012-09-10 (×2): 50 ug via INTRAVENOUS

## 2012-09-10 MED ORDER — LIDOCAINE HCL (PF) 1 % IJ SOLN
INTRAMUSCULAR | Status: AC
  Filled 2012-09-10: qty 5

## 2012-09-10 MED ORDER — OXYCODONE-ACETAMINOPHEN 7.5-325 MG PO TABS: 1.0000 | ORAL_TABLET | Freq: Four times a day (QID) | ORAL | Status: DC | PRN

## 2012-09-10 MED ORDER — CIPROFLOXACIN IN D5W 200 MG/100ML IV SOLN
200.0000 mg | Freq: Once | INTRAVENOUS | Status: AC
  Administered 2012-09-10: 200 mg via INTRAVENOUS

## 2012-09-10 MED ORDER — LIDOCAINE HCL (CARDIAC) 20 MG/ML IV SOLN
INTRAVENOUS | Status: DC | PRN
  Administered 2012-09-10: 50 mg via INTRAVENOUS

## 2012-09-10 MED ORDER — ONDANSETRON HCL 4 MG/2ML IJ SOLN
INTRAMUSCULAR | Status: AC
  Filled 2012-09-10: qty 2

## 2012-09-10 MED ORDER — ONDANSETRON HCL 4 MG/2ML IJ SOLN: 4.0000 mg | Freq: Once | INTRAMUSCULAR | Status: DC | PRN

## 2012-09-10 MED ORDER — CIPROFLOXACIN IN D5W 200 MG/100ML IV SOLN
INTRAVENOUS | Status: AC
  Filled 2012-09-10: qty 100

## 2012-09-10 MED ORDER — MIDAZOLAM HCL 2 MG/2ML IJ SOLN
1.0000 mg | INTRAMUSCULAR | Status: DC | PRN
  Administered 2012-09-10 (×2): 2 mg via INTRAVENOUS

## 2012-09-10 MED ORDER — MIDAZOLAM HCL 2 MG/2ML IJ SOLN
INTRAMUSCULAR | Status: AC
  Filled 2012-09-10: qty 2

## 2012-09-10 MED ORDER — PROPOFOL 10 MG/ML IV BOLUS
INTRAVENOUS | Status: DC | PRN
  Administered 2012-09-10: 150 mg via INTRAVENOUS

## 2012-09-10 MED ORDER — FENTANYL CITRATE 0.05 MG/ML IJ SOLN
INTRAMUSCULAR | Status: DC | PRN
  Administered 2012-09-10: 25 ug via INTRAVENOUS
  Administered 2012-09-10: 50 ug via INTRAVENOUS
  Administered 2012-09-10: 25 ug via INTRAVENOUS

## 2012-09-10 SURGICAL SUPPLY — 22 items
BAG DRAIN URO TABLE W/ADPT NS (DRAPE) ×2 IMPLANT
BAG DRN 8 ADPR NS SKTRN CSTL (DRAPE) ×1
CATH 5 FR WEDGE TIP (UROLOGICAL SUPPLIES) ×2 IMPLANT
CATH OPEN TIP 5FR (CATHETERS) ×2 IMPLANT
CLOTH BEACON ORANGE TIMEOUT ST (SAFETY) ×2 IMPLANT
DILATOR UROMAX ULTRA (MISCELLANEOUS) ×1 IMPLANT
GLOVE BIO SURGEON STRL SZ7 (GLOVE) ×2 IMPLANT
GLOVE BIOGEL PI IND STRL 7.0 (GLOVE) IMPLANT
GLOVE BIOGEL PI INDICATOR 7.0 (GLOVE) ×3
GLOVE SS BIOGEL STRL SZ 6.5 (GLOVE) IMPLANT
GLOVE SUPERSENSE BIOGEL SZ 6.5 (GLOVE) ×1
GOWN STRL REIN XL XLG (GOWN DISPOSABLE) ×2 IMPLANT
IV NS IRRIG 3000ML ARTHROMATIC (IV SOLUTION) ×5 IMPLANT
KIT ROOM TURNOVER AP CYSTO (KITS) ×2 IMPLANT
MANIFOLD NEPTUNE II (INSTRUMENTS) ×2 IMPLANT
PACK CYSTO (CUSTOM PROCEDURE TRAY) ×2 IMPLANT
PAD ARMBOARD 7.5X6 YLW CONV (MISCELLANEOUS) ×2 IMPLANT
SET IRRIGATING DISP (SET/KITS/TRAYS/PACK) ×2 IMPLANT
STENT PERCUFLEX 4.8FRX24 (STENTS) ×1 IMPLANT
STONE RETRIEVAL GEMINI 2.4 FR (MISCELLANEOUS) ×1 IMPLANT
TOWEL OR 17X26 4PK STRL BLUE (TOWEL DISPOSABLE) ×2 IMPLANT
WIRE GUIDE BENTSON .035 15CM (WIRE) ×3 IMPLANT

## 2012-09-10 NOTE — Preoperative (Signed)
Beta Blockers   Reason not to administer Beta Blockers:Not Applicable 

## 2012-09-10 NOTE — Anesthesia Postprocedure Evaluation (Signed)
  Anesthesia Post-op Note  Patient: Brandi Dunn  Procedure(s) Performed: Procedure(s): CYSTOSCOPY WITH LEFT RETROGRADE PYELOGRAM; LEFT URETERAL STENT PLACEMENT (Left) LEFT URETEROSCOPIC STONE EXTRACTION WITH BASKET (Left)  Patient Location: PACU  Anesthesia Type:General  Level of Consciousness: awake, alert , oriented and patient cooperative  Airway and Oxygen Therapy: Patient Spontanous Breathing  Post-op Pain: mild  Post-op Assessment: Post-op Vital signs reviewed, Patient's Cardiovascular Status Stable, Respiratory Function Stable, Patent Airway, No signs of Nausea or vomiting, Adequate PO intake, Pain level controlled, No headache, No backache, No residual numbness and No residual motor weakness  Post-op Vital Signs: Reviewed and stable  Complications: No apparent anesthesia complications

## 2012-09-10 NOTE — Transfer of Care (Signed)
Immediate Anesthesia Transfer of Care Note  Patient: Brandi Dunn  Procedure(s) Performed: Procedure(s): CYSTOSCOPY WITH LEFT RETROGRADE PYELOGRAM; LEFT URETERAL STENT PLACEMENT (Left) LEFT URETEROSCOPIC STONE EXTRACTION WITH BASKET (Left)  Patient Location: PACU  Anesthesia Type:General  Level of Consciousness: awake, alert , oriented and patient cooperative  Airway & Oxygen Therapy: Patient Spontanous Breathing and Patient connected to face mask oxygen  Post-op Assessment: Report given to PACU RN and Post -op Vital signs reviewed and stable  Post vital signs: Reviewed and stable  Complications: No apparent anesthesia complications

## 2012-09-10 NOTE — H&P (Signed)
NAMELATORRIA, ZEOLI              ACCOUNT NO.:  [id number]  MEDICAL RECORD NO.:  [id number]  LOCATION:  [facility]                         FACILITY:  [facility]  PHYSICIAN:  Ky Barban, M.D.DATE OF BIRTH:  [birth year]  DATE OF ADMISSION:  09/09/2012 DATE OF DISCHARGE:  LH                             HISTORY & PHYSICAL   CHIEF COMPLAINT:  Recurrent left renal colic.  HISTORY:  A 32 year old female complains of pain in her left flank since Thursday on and off.  Pain is very bad, severe.  No fever, chills, or any voiding difficulty.  No gross hematuria.  It is associated with nausea and vomiting.  She says that 5 years ago, she passed a kidney stone.  She had cholecystectomy done about a year ago.  PAST MEDICAL HISTORY:  No history of diabetes or hypertension.  FAMILY HISTORY:  No history of kidney stones.  PERSONAL HISTORY:  Never smoked.  Drinks socially.  REVIEW OF SYSTEMS:  Unremarkable.  PHYSICAL EXAMINATION:  VITAL SIGNS:  Blood pressure 118/64, temperature 97.6. CENTRAL NERVOUS SYSTEM:  No gross neurological deficits. HEAD, NECK, EYES, ENT:  Negative. CHEST:  Symmetrical.  Normal breath sounds. HEART:  Regular sinus rhythm. ABDOMEN:  Soft, flat.  Liver, spleen, kidneys not palpable.  A 1+ left CVA tenderness. PELVIC:  Deferred. EXTREMITIES:  Normal.  IMPRESSION:  Left ureteral calculus.  PLAN:  A stone basket and use of double-J stent under anesthesia as outpatient.  I explained this thoroughly to the patient on CT scan which was done in the emergency room, shows there is a 5 mm stone in the left upper ureter.  I sent her today for KUB and the stone looks like it has moved into the distal ureter; but of course, I am not sure if that is the stone or we will have to do a retrograde to confirm it.  I told the patient.  She understands.  I told her if the stone is in the ureterovesical junction or distal ureter, I will do the ureteroscopic stone basket attempt and holmium  laser lithotripsy.  No guarantees are given because stone can migrated.  I also discussed the possibility of ureteral perforation leading to open surgery, use of double-J stent. She understands and want me to go ahead and proceed with it.  She is coming as outpatient tomorrow.  Then I will proceed with it.  Other treatment option was lithotripsy, but I do not know if that is the stone and under the circumstances, I think the best thing will be to do a stone basket with a retrograde pyelogram.     Ky Barban, M.D.     MIJ/MEDQ  D:  09/09/2012  T:  09/10/2012  Job:  [id number]

## 2012-09-10 NOTE — Progress Notes (Signed)
No change in H&P on reexamination. 

## 2012-09-10 NOTE — Anesthesia Preprocedure Evaluation (Addendum)
Anesthesia Evaluation  Patient identified by MRN, date of birth, ID band Patient awake    Reviewed: Allergy & Precautions, H&P , NPO status , Patient's Chart, lab work & pertinent test results  History of Anesthesia Complications Negative for: history of anesthetic complications  Airway Mallampati: III TM Distance: >3 FB Neck ROM: Full    Dental  (+) Teeth Intact   Pulmonary neg pulmonary ROS,  breath sounds clear to auscultation        Cardiovascular negative cardio ROS  Rhythm:Regular Rate:Normal     Neuro/Psych  Headaches,    GI/Hepatic GERD- (inactive now)  Medicated and Controlled,  Endo/Other    Renal/GU      Musculoskeletal   Abdominal   Peds  Hematology   Anesthesia Other Findings   Reproductive/Obstetrics                          Anesthesia Physical Anesthesia Plan  ASA: II  Anesthesia Plan: General   Post-op Pain Management:    Induction: Intravenous  Airway Management Planned: LMA  Additional Equipment:   Intra-op Plan:   Post-operative Plan: Extubation in OR  Informed Consent: I have reviewed the patients History and Physical, chart, labs and discussed the procedure including the risks, benefits and alternatives for the proposed anesthesia with the patient or authorized representative who has indicated his/her understanding and acceptance.     Plan Discussed with:   Anesthesia Plan Comments:        Anesthesia Quick Evaluation

## 2012-09-10 NOTE — Brief Op Note (Signed)
09/10/2012  1:03 PM  PATIENT:  Brandi Dunn  32 y.o. female  PRE-OPERATIVE DIAGNOSIS:  left ureteral calculus  POST-OPERATIVE DIAGNOSIS:  left ureteral calculus  PROCEDURE:  Procedure(s): CYSTOSCOPY WITH LEFT RETROGRADE PYELOGRAM; LEFT URETERAL STENT PLACEMENT (Left) LEFT URETEROSCOPIC STONE EXTRACTION WITH BASKET (Left)  SURGEON:  Surgeon(s) and Role:    * Ky Barban, MD - Primary  PHYSICIAN ASSISTANT:   ASSISTANTS: none   ANESTHESIA:   general  EBL:     BLOOD ADMINISTERED:none  DRAINS: double j stent no [id number]   LOCAL MEDICATIONS USED:  NONE  SPECIMEN:  No Specimen  DISPOSITION OF SPECIMEN:  N/A  COUNTS:  YES  TOURNIQUET:  * No tourniquets in log *  DICTATION: .Other Dictation: Dictation Number dictation [id number]  PLAN OF CARE: Discharge to home after PACU  PATIENT DISPOSITION:  PACU - hemodynamically stable.   Delay start of Pharmacological VTE agent (>24hrs) due to surgical blood loss or risk of bleeding:

## 2012-09-10 NOTE — ED Provider Notes (Signed)
Medical screening examination/treatment/procedure(s) were performed by non-physician practitioner and as supervising physician I was immediately available for consultation/collaboration.   Donnovan Stamour B. Yesenia Locurto, MD 09/10/12 1933 

## 2012-09-11 ENCOUNTER — Encounter (HOSPITAL_COMMUNITY): Payer: Self-pay | Admitting: Urology

## 2012-09-11 NOTE — Op Note (Signed)
NAMEKRESTA, TEMPLEMAN              ACCOUNT NO.:  [id number]  MEDICAL RECORD NO.:  [id number]  LOCATION:  [facility]                          FACILITY:  [facility]  PHYSICIAN:  Ky Barban, M.D.DATE OF BIRTH:  1980/04/04  DATE OF PROCEDURE: DATE OF DISCHARGE:  09/10/2012                              OPERATIVE REPORT   PREOPERATIVE DIAGNOSIS:  Distal left ureteral calculus.  POSTOPERATIVE DIAGNOSIS:  Distal left ureteral calculus.  PROCEDURE:  Cystoscopy, left retrograde pyelogram, ureteroscopic stone basket extraction, removal of stone, insertion of double-J stent without string.  ANESTHESIA:  General.  The patient under general anesthesia in lithotomy position.  After usual prep and drape, #25 cystoscope introduced into the bladder.  Left ureteral orifice catheterized with a wedge catheter.  Hypaque injected under fluoroscopic control.  Dye goes up into the upper ureter.  There is a filling defect in the ureterovesical junction.  The ureter was above that slightly dilated.  That is the stone in the ureterovesical junction, so a guidewire was passed up into the renal pelvis.  Over the guidewire, 15 balloon was introduced into the intramural ureter which was dilated.  After dilating the ureter, then short rigid ureteroscope was introduced alongside the guidewire.  Went to the level of the stone under direct vision, the stone was engaged in the basket and removed without any complication.  I did not have to use the laser to break the stone.  Stone just came out without any difficulty.  After removing the stone, the ureter was inspected, looks fine.  So, decided to leave a double-J stent over the guidewire, 5-French 24-cm double-J stent was positioned between the renal pelvis and the bladder.  Nice loop was obtained in the renal pelvis and the bladder after removing the guidewire.  All the instruments were removed.  The patient left the operating room in satisfactory condition.  This  patient does not have any string attached to the stent, so I have to do a cystoscopy to remove it.     Ky Barban, M.D.     MIJ/MEDQ  D:  09/10/2012  T:  09/11/2012  Job:  [id number]

## 2013-03-28 ENCOUNTER — Encounter: Payer: Self-pay | Admitting: Family Medicine

## 2013-03-28 ENCOUNTER — Ambulatory Visit (INDEPENDENT_AMBULATORY_CARE_PROVIDER_SITE_OTHER): Payer: BC Managed Care – PPO | Admitting: Family Medicine

## 2013-03-28 VITALS — BP 112/70 | Ht 62.0 in | Wt 238.0 lb

## 2013-03-28 DIAGNOSIS — L723 Sebaceous cyst: Secondary | ICD-10-CM

## 2013-03-28 NOTE — Progress Notes (Signed)
   Subjective:    Patient ID: Brandi Dunn, female    DOB: 05/03/1980, 33 y.o.   MRN: [id number]  HPI Patient has a knot behind her right ear. It has been present for over 1 year now. It is painful to touch. No other symptoms noted at this.  Patient relates she had a friend to have a spot behind her ear that was involved with the facial nerve she had surgery and had facial weakness patient is worried about this happening to her she denies any type of sinus illness lately she states this knot behind her ear comes and goes in terms of pain at times seems bigger than others. No change in hearing.  Review of Systems    negative for sinus symptoms runny nose cough wheezing vomiting diarrhea Objective:   Physical Exam  On exam sinuses nontender throat is normal neck no masses in addition to this there is a small cyst behind the right ear. I find no evidence of any type of deep internal cyst.      Assessment & Plan:  #1 sebaceous cyst-this is a small cyst behind the ears she is extremely worried about it. Apparently she had a cousin who had some sort of growth behind the ear that once surgery was done she had facial weakness I have discussed with the patient that I did not feel surgery indicated currently also discussed with her that I felt that this was a benign issue patient relates she's been having severe pain for several months and if worse over the past few weeks therefore we will go ahead and refer to ENT for further evaluation

## 2013-04-12 ENCOUNTER — Encounter: Payer: Self-pay | Admitting: *Deleted

## 2013-05-08 ENCOUNTER — Ambulatory Visit (INDEPENDENT_AMBULATORY_CARE_PROVIDER_SITE_OTHER): Payer: BC Managed Care – PPO | Admitting: Otolaryngology

## 2013-05-08 DIAGNOSIS — Q181 Preauricular sinus and cyst: Secondary | ICD-10-CM

## 2013-06-05 ENCOUNTER — Ambulatory Visit (INDEPENDENT_AMBULATORY_CARE_PROVIDER_SITE_OTHER): Payer: BC Managed Care – PPO | Admitting: Otolaryngology

## 2013-12-04 ENCOUNTER — Ambulatory Visit (INDEPENDENT_AMBULATORY_CARE_PROVIDER_SITE_OTHER): Payer: BC Managed Care – PPO | Admitting: Otolaryngology

## 2013-12-04 DIAGNOSIS — R591 Generalized enlarged lymph nodes: Secondary | ICD-10-CM

## 2014-01-12 ENCOUNTER — Ambulatory Visit (INDEPENDENT_AMBULATORY_CARE_PROVIDER_SITE_OTHER): Payer: BC Managed Care – PPO | Admitting: Family Medicine

## 2014-01-12 ENCOUNTER — Encounter: Payer: Self-pay | Admitting: Family Medicine

## 2014-01-12 VITALS — BP 112/80 | Temp 97.8°F | Ht 62.0 in | Wt 231.0 lb

## 2014-01-12 DIAGNOSIS — J019 Acute sinusitis, unspecified: Secondary | ICD-10-CM

## 2014-01-12 DIAGNOSIS — B9689 Other specified bacterial agents as the cause of diseases classified elsewhere: Secondary | ICD-10-CM

## 2014-01-12 MED ORDER — HYDROCODONE-HOMATROPINE 5-1.5 MG/5ML PO SYRP: ORAL_SOLUTION | ORAL | Status: DC

## 2014-01-12 MED ORDER — AMOXICILLIN 500 MG PO TABS: 500.0000 mg | ORAL_TABLET | Freq: Three times a day (TID) | ORAL | Status: DC

## 2014-01-12 NOTE — Progress Notes (Signed)
   Subjective:    Patient ID: Brandi Dunn, female    DOB: 02-Dec-1980, 33 y.o.   MRN: [id number]  Cough This is a new problem. The current episode started in the past 7 days. The problem has been gradually worsening. The problem occurs constantly. The cough is productive of sputum. Associated symptoms include chest pain, headaches and rhinorrhea. Pertinent negatives include no ear pain, fever, shortness of breath or wheezing. Associated symptoms comments: Vomiting . Nothing aggravates the symptoms. She has tried OTC cough suppressant for the symptoms. The treatment provided no relief.   Patient states that she has no other concerns at this time.   Review of Systems  Constitutional: Negative for fever and activity change.  HENT: Positive for congestion and rhinorrhea. Negative for ear pain.   Eyes: Negative for discharge.  Respiratory: Positive for cough. Negative for shortness of breath and wheezing.   Cardiovascular: Positive for chest pain.  Neurological: Positive for headaches.       Objective:   Physical Exam  Constitutional: She appears well-developed.  HENT:  Head: Normocephalic.  Nose: Nose normal.  Mouth/Throat: Oropharynx is clear and moist. No oropharyngeal exudate.  Neck: Neck supple.  Cardiovascular: Normal rate and normal heart sounds.   No murmur heard. Pulmonary/Chest: Effort normal and breath sounds normal. She has no wheezes.  Lymphadenopathy:    She has no cervical adenopathy.  Skin: Skin is warm and dry.  Nursing note and vitals reviewed.         Assessment & Plan:  Acute bacterial rhinosinusitis  Upper rest real illness with secondary sinusitis antibiotics prescribed warning signs discussed follow-up if problems  Cough medicine for nighttime use caution drowsiness

## 2014-01-28 ENCOUNTER — Ambulatory Visit (INDEPENDENT_AMBULATORY_CARE_PROVIDER_SITE_OTHER): Payer: BC Managed Care – PPO | Admitting: Advanced Practice Midwife

## 2014-01-28 ENCOUNTER — Encounter: Payer: Self-pay | Admitting: Advanced Practice Midwife

## 2014-01-28 ENCOUNTER — Other Ambulatory Visit (HOSPITAL_COMMUNITY)
Admission: RE | Admit: 2014-01-28 | Discharge: 2014-01-28 | Disposition: A | Discharge status: Home / Self Care | Payer: BC Managed Care – PPO | Source: Ambulatory Visit | Attending: Advanced Practice Midwife | Admitting: Advanced Practice Midwife

## 2014-01-28 VITALS — BP 120/80 | Ht 64.0 in | Wt 236.0 lb

## 2014-01-28 DIAGNOSIS — Z01419 Encounter for gynecological examination (general) (routine) without abnormal findings: Secondary | ICD-10-CM | POA: Insufficient documentation

## 2014-01-28 DIAGNOSIS — Z1151 Encounter for screening for human papillomavirus (HPV): Secondary | ICD-10-CM | POA: Insufficient documentation

## 2014-01-28 NOTE — Progress Notes (Signed)
Brandi Dunn 33 y.o.  Filed Vitals:   01/28/14 1135  BP: 120/80     Past Medical History: Past Medical History  Diagnosis Date  . Migraine     last migraine 03/24/11  . GERD (gastroesophageal reflux disease)   . H/O renal calculi   . 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 Dilation and curettage of uterus  2000?    x2, [facility]  . Cholecystectomy  04/12/2011    Procedure: LAPAROSCOPIC CHOLECYSTECTOMY;  Surgeon: Jamesetta So, MD;  Location: [facility];  Service: General;  Laterality: N/A;  . Cystoscopy w/ ureteral stent placement Left 09/10/2012    Procedure: CYSTOSCOPY WITH LEFT RETROGRADE PYELOGRAM; LEFT URETERAL STENT PLACEMENT;  Surgeon: Marissa Nestle, MD;  Location: [facility];  Service: Urology;  Laterality: Left;  . Stone extraction with basket Left 09/10/2012    Procedure: BALLOON DILATATION LEFT URETER; LEFT URETEROSCOPIC STONE EXTRACTION WITH BASKET;  Surgeon: Marissa Nestle, MD;  Location: [facility];  Service: Urology;  Laterality: Left;    Family History: Family History  Problem Relation Age of Onset  . Anesthesia problems Neg Hx   . Hypotension Neg Hx   . Malignant hyperthermia Neg Hx   . Pseudochol deficiency Neg Hx     Social History: History  Substance Use Topics  . Smoking status: Never Smoker   . Smokeless tobacco: Not on file  . Alcohol Use: No    Allergies: No Known Allergies   Current outpatient prescriptions: amoxicillin (AMOXIL) 500 MG tablet, Take 1 tablet (500 mg total) by mouth 3 (three) times daily. (Patient not taking: Reported on 01/28/2014), Disp: 30 tablet, Rfl: 0;  HYDROcodone-homatropine (HYCODAN) 5-1.5 MG/5ML syrup, 1 tsp qhs prn (Patient not taking: Reported on 01/28/2014), Disp: 120 mL, Rfl: 0  History of Present Illness: Here for pap/physical.  Last pap 7/13 (normal).  Declines screening labs.  Heard IUD's can get imbedded.  No problems, just wants to make sure  it's ok.   Review of Systems   Patient denies any headaches, blurred vision, shortness of breath, chest pain, abdominal pain, problems with bowel movements, urination, or intercourse.   Physical Exam: General:  Well developed, well nourished, no acute distress Skin:  Warm and dry Neck:  Midline trachea, normal thyroid Lungs; Clear to auscultation bilaterally Breast:  No dominant palpable mass, retraction, or nipple discharge Cardiovascular: Regular rate and rhythm Abdomen:  Soft, non tender, no hepatosplenomegaly Pelvic:  External genitalia is normal in appearance.  The vagina is normal in appearance.  The cervix is bulbous. IUD strings visible. Bedside US shows IUD in fundus with both ends of T visible. Uterus is felt to be normal size, shape, and contour.  No adnexal masses or tenderness noted.  Extremities:  No swelling or varicosities noted Psych:  No mood changes.     Impression: Normal gyn exam     Plan: IUD removal next year.

## 2014-01-29 LAB — CYTOLOGY - PAP

## 2014-12-16 ENCOUNTER — Ambulatory Visit: Payer: Self-pay | Admitting: Advanced Practice Midwife

## 2014-12-17 ENCOUNTER — Encounter: Payer: Self-pay | Admitting: Advanced Practice Midwife

## 2014-12-17 ENCOUNTER — Ambulatory Visit (INDEPENDENT_AMBULATORY_CARE_PROVIDER_SITE_OTHER): Payer: BLUE CROSS/BLUE SHIELD | Admitting: Advanced Practice Midwife

## 2014-12-17 VITALS — BP 116/78 | HR 84 | Ht 63.0 in | Wt 239.0 lb

## 2014-12-17 DIAGNOSIS — Z30432 Encounter for removal of intrauterine contraceptive device: Secondary | ICD-10-CM

## 2014-12-17 DIAGNOSIS — Z3202 Encounter for pregnancy test, result negative: Secondary | ICD-10-CM | POA: Diagnosis not present

## 2014-12-17 LAB — POCT URINE PREGNANCY: Preg Test, Ur: NEGATIVE

## 2014-12-17 MED ORDER — NORGESTIMATE-ETH ESTRADIOL 0.25-35 MG-MCG PO TABS
1.0000 | ORAL_TABLET | Freq: Every day | ORAL | Status: DC
Start: 2014-12-17 — End: 2015-03-10

## 2014-12-17 NOTE — Progress Notes (Signed)
  Hansel Starling Pribyl 34 y.o.  There were no vitals filed for this visit. Past Medical History  Diagnosis Date  . Migraine     last migraine 03/24/11  . GERD (gastroesophageal reflux disease)   . H/O renal calculi   . 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 Dilation and curettage of uterus  2000?    x2, [facility]  . Cholecystectomy  04/12/2011    Procedure: LAPAROSCOPIC CHOLECYSTECTOMY;  Surgeon: Jamesetta So, MD;  Location: [facility];  Service: General;  Laterality: N/A;  . Cystoscopy w/ ureteral stent placement Left 09/10/2012    Procedure: CYSTOSCOPY WITH LEFT RETROGRADE PYELOGRAM; LEFT URETERAL STENT PLACEMENT;  Surgeon: Marissa Nestle, MD;  Location: [facility];  Service: Urology;  Laterality: Left;  . Stone extraction with basket Left 09/10/2012    Procedure: BALLOON DILATATION LEFT URETER; LEFT URETEROSCOPIC STONE EXTRACTION WITH BASKET;  Surgeon: Marissa Nestle, MD;  Location: [facility];  Service: Urology;  Laterality: Left;   family history is negative for Anesthesia problems, Hypotension, Malignant hyperthermia, and Pseudochol deficiency. No current outpatient prescriptions on file.    Here for IUD removal.  She had the Mirena IUD placed 5 years ago and would like it removed . Her plans for future contraception are COC.s.  A graves speculum was placed, and the strings were visible.  They were grasped with a curved Claiborne Billings and the IUD easily removed.  Pt given IUD removal f/u instructions.

## 2014-12-21 ENCOUNTER — Telehealth: Payer: Self-pay | Admitting: Advanced Practice Midwife

## 2014-12-21 NOTE — Telephone Encounter (Signed)
Spoke with Brandi Dunn. Brandi Dunn got IUD removed on Thursday. She started bleeding heavy on Sunday. Had IUD x 5 years and had a light period every month with IUD. Sunday she was having to change pad every 45 minutes. Bleeding has gotten a little lighter today and Brandi Dunn is feeling better today. I spoke with Maudie Mercury, CNM and she advised if bleeding got heavy again, where she's changing saturated pad every hour, or passing blood clots, or feeling dizzy, let us know. Brandi Dunn was advised to start birth control pills today. She didn't start them yesterday because she was bleeding so much. Brandi Dunn voiced understanding. [place]

## 2015-01-22 ENCOUNTER — Encounter: Payer: Self-pay | Admitting: Nurse Practitioner

## 2015-01-22 ENCOUNTER — Ambulatory Visit (INDEPENDENT_AMBULATORY_CARE_PROVIDER_SITE_OTHER): Payer: BLUE CROSS/BLUE SHIELD | Admitting: Nurse Practitioner

## 2015-01-22 VITALS — BP 110/74 | Ht 62.0 in | Wt 238.0 lb

## 2015-01-22 DIAGNOSIS — L918 Other hypertrophic disorders of the skin: Secondary | ICD-10-CM | POA: Diagnosis not present

## 2015-01-26 ENCOUNTER — Encounter: Payer: Self-pay | Admitting: Nurse Practitioner

## 2015-01-26 NOTE — Progress Notes (Signed)
Subjective:  Presents for removal of skin tags.   Objective:   BP 110/74 mmHg  Ht 5\' 2"  (1.575 m)  Wt 238 lb (107.956 kg)  BMI 43.52 kg/m2 Removed one large skin tag just beneath the right breast after injected the area with lidocaine with epi. Minimal bleeding. A smaller tag removed from the left inner thigh without anesthetic. Patient tolerated without difficulty. Both skin tags were benign in appearance.   Assessment: Cutaneous skin tags  Plan: reviewed wound care; call back if any problems. Patient understands that skin tags can reoccur.

## 2015-02-02 ENCOUNTER — Encounter: Payer: Self-pay | Admitting: Family Medicine

## 2015-02-02 ENCOUNTER — Ambulatory Visit (INDEPENDENT_AMBULATORY_CARE_PROVIDER_SITE_OTHER): Payer: BLUE CROSS/BLUE SHIELD | Admitting: Family Medicine

## 2015-02-02 VITALS — BP 138/86 | Temp 97.8°F | Ht 62.0 in | Wt 234.4 lb

## 2015-02-02 DIAGNOSIS — B338 Other specified viral diseases: Secondary | ICD-10-CM

## 2015-02-02 DIAGNOSIS — J019 Acute sinusitis, unspecified: Secondary | ICD-10-CM

## 2015-02-02 DIAGNOSIS — B348 Other viral infections of unspecified site: Secondary | ICD-10-CM

## 2015-02-02 DIAGNOSIS — B9689 Other specified bacterial agents as the cause of diseases classified elsewhere: Secondary | ICD-10-CM

## 2015-02-02 MED ORDER — AZITHROMYCIN 250 MG PO TABS: ORAL_TABLET | ORAL | Status: DC

## 2015-02-02 MED ORDER — HYDROCODONE-ACETAMINOPHEN 5-325 MG PO TABS: 1.0000 | ORAL_TABLET | Freq: Four times a day (QID) | ORAL | Status: DC | PRN

## 2015-02-02 NOTE — Progress Notes (Signed)
   Subjective:    Patient ID: Brandi Dunn, female    DOB: 03/25/1980, 34 y.o.   MRN: [id number]  Cough This is a new problem. The current episode started in the past 7 days. Associated symptoms include ear pain, headaches, nasal congestion, rhinorrhea and a sore throat. Pertinent negatives include no chest pain, fever, shortness of breath or wheezing. Associated symptoms comments: Abdominal pan. She has tried OTC cough suppressant for the symptoms.    PMH benign patient started up with illness over the weekend with body aches headache congestion cough then with sinus congestion denies wheezing or difficulty breathing   Review of Systems  Constitutional: Negative for fever and activity change.  HENT: Positive for congestion, ear pain, rhinorrhea and sore throat.   Eyes: Negative for discharge.  Respiratory: Positive for cough. Negative for shortness of breath and wheezing.   Cardiovascular: Negative for chest pain.  Neurological: Positive for headaches.       Objective:   Physical Exam  Constitutional: She appears well-developed.  HENT:  Head: Normocephalic.  Nose: Nose normal.  Mouth/Throat: Oropharynx is clear and moist. No oropharyngeal exudate.  Neck: Neck supple.  Cardiovascular: Normal rate and normal heart sounds.   No murmur heard. Pulmonary/Chest: Effort normal and breath sounds normal. She has no wheezes.  Lymphadenopathy:    She has no cervical adenopathy.  Skin: Skin is warm and dry.  Nursing note and vitals reviewed.         Assessment & Plan:   parainfluenza illness with secondary rhinosinusitis antibiotics prescribed warning signs discussed follow-up if problems

## 2015-02-02 NOTE — Patient Instructions (Signed)
    You have parainfluenza which is like the flu Influenza, Adult Influenza ("the flu") is a viral infection of the respiratory tract. It occurs more often in winter months because people spend more time in close contact with one another. Influenza can make you feel very sick. Influenza easily spreads from person to person (contagious). CAUSES  Influenza is caused by a virus that infects the respiratory tract. You can catch the virus by breathing in droplets from an infected person's cough or sneeze. You can also catch the virus by touching something that was recently contaminated with the virus and then touching your mouth, nose, or eyes. RISKS AND COMPLICATIONS You may be at risk for a more severe case of influenza if you smoke cigarettes, have diabetes, have chronic heart disease (such as heart failure) or lung disease (such as asthma), or if you have a weakened immune system. Elderly people and pregnant women are also at risk for more serious infections. The most common problem of influenza is a lung infection (pneumonia). Sometimes, this problem can require emergency medical care and may be life threatening. SIGNS AND SYMPTOMS  Symptoms typically last 4 to 10 days and may include:  Fever.  Chills.  Headache, body aches, and muscle aches.  Sore throat.  Chest discomfort and cough.  Poor appetite.  Weakness or feeling tired.  Dizziness.  Nausea or vomiting. DIAGNOSIS  Diagnosis of influenza is often made based on your history and a physical exam. A nose or throat swab test can be done to confirm the diagnosis. TREATMENT  In mild cases, influenza goes away on its own. Treatment is directed at relieving symptoms. For more severe cases, your health care provider may prescribe antiviral medicines to shorten the sickness. Antibiotic medicines are not effective because the infection is caused by a virus, not by bacteria. HOME CARE INSTRUCTIONS  Take medicines only as directed by your  health care provider.  Use a cool mist humidifier to make breathing easier.  Get plenty of rest until your temperature returns to normal. This usually takes 3 to 4 days.  Drink enough fluid to keep your urine clear or pale yellow.  Cover yourmouth and nosewhen coughing or sneezing,and wash your handswellto prevent thevirusfrom spreading.  Stay homefromwork orschool untilthe fever is gonefor at least 36full day. PREVENTION  An annual influenza vaccination (flu shot) is the best way to avoid getting influenza. An annual flu shot is now routinely recommended for all adults in the [place] IF:  You experiencechest pain, yourcough worsens,or you producemore mucus.  Youhave nausea,vomiting, ordiarrhea.  Your fever returns or gets worse. SEEK IMMEDIATE MEDICAL CARE IF:  You havetrouble breathing, you become short of breath,or your skin ornails becomebluish.  You have severe painor stiffnessin the neck.  You develop a sudden headache, or pain in the face or ear.  You have nausea or vomiting that you cannot control. MAKE SURE YOU:   Understand these instructions.  Will watch your condition.  Will get help right away if you are not doing well or get worse.   This information is not intended to replace advice given to you by your health care provider. Make sure you discuss any questions you have with your health care provider.   Document Released: 01/21/2000 Document Revised: 02/13/2014 Document Reviewed: 04/24/2011 Elsevier Interactive Patient Education [employer].

## 2015-02-03 ENCOUNTER — Telehealth: Payer: Self-pay | Admitting: Family Medicine

## 2015-02-03 NOTE — Telephone Encounter (Signed)
Discussed with patient. Patient advised Will take a few days, if h/ a does not improve may need to go to to er for eval and pain shot, no pain shots available in out pt offices anymore. Patient verbalized understanding.

## 2015-02-03 NOTE — Telephone Encounter (Signed)
Pt called stating that her headache is still at a 10. Pt was seen yesterday by Dr. Nicki Reaper and was told that if she wasn't any better today to call and speak with a nurse. Please advise.    [facility]

## 2015-02-03 NOTE — Telephone Encounter (Signed)
Will take a few days, if h a does not improve may need to go to to er for eval and pain shot, no pain shots available in out pt offices anymore

## 2015-02-03 NOTE — Telephone Encounter (Signed)
[facility] 02/03/15

## 2015-02-03 NOTE — Telephone Encounter (Signed)
[facility] 02/03/15

## 2015-02-03 NOTE — Telephone Encounter (Signed)
Spoke with patient to get more information on symptoms. Patient stated that she has had headache since her visit with Dr.Scott. She was diagnosed on yesterday with the parainfluenza and was prescribed antibiotics and given hydrocodone. Patient states that she has been taking hydrocodone every 6 hours along with ibuprofen and headache has not been relieved. Please advise?

## 2015-03-10 ENCOUNTER — Ambulatory Visit (INDEPENDENT_AMBULATORY_CARE_PROVIDER_SITE_OTHER): Payer: BLUE CROSS/BLUE SHIELD | Admitting: Advanced Practice Midwife

## 2015-03-10 ENCOUNTER — Encounter: Payer: Self-pay | Admitting: Advanced Practice Midwife

## 2015-03-10 VITALS — BP 108/80 | HR 78 | Ht 64.0 in | Wt 238.0 lb

## 2015-03-10 DIAGNOSIS — Z30430 Encounter for insertion of intrauterine contraceptive device: Secondary | ICD-10-CM | POA: Diagnosis not present

## 2015-03-10 DIAGNOSIS — Z30014 Encounter for initial prescription of intrauterine contraceptive device: Secondary | ICD-10-CM

## 2015-03-10 NOTE — Progress Notes (Signed)
Brandi Dunn is a 35 y.o. year old  female   who presents for placement of a Mirena IUD. Her LMP was today and her pregnancy test today is negative.    The risks and benefits of the method and placement have been thouroughly reviewed with the patient and all questions were answered.  Specifically the patient is aware of failure rate of 02/998, expulsion of the IUD and of possible perforation.  The patient is aware of irregular bleeding due to the method and understands the incidence of irregular bleeding diminishes with time.  Time out was performed.  A Graves speculum was placed.  The cervix was prepped using Betadine. The uterus was found to be neutral and it sounded to 7 cm.  The cervix was grasped with a tenaculum and the IUD was inserted to 7 cm.  It was pulled back 1 cm and the IUD was disengaged.  The strings were trimmed to 3 cm.  Sonogram was performed and the proper placement of the IUD was verified.  The patient was instructed on signs and symptoms of infection and to check for the strings after each menses or each month.  The patient is to refrain from intercourse for 3 days.  The patient is scheduled for a return appointment after her first menses or 4 weeks.  CRESENZO-DISHMAN,Tyquez Hollibaugh 03/10/2015 4:21 PM

## 2015-04-07 ENCOUNTER — Ambulatory Visit: Payer: BLUE CROSS/BLUE SHIELD | Admitting: Advanced Practice Midwife

## 2015-04-22 ENCOUNTER — Ambulatory Visit: Payer: BLUE CROSS/BLUE SHIELD | Admitting: Advanced Practice Midwife

## 2015-08-18 ENCOUNTER — Encounter: Payer: Self-pay | Admitting: Nurse Practitioner

## 2015-08-18 ENCOUNTER — Ambulatory Visit (INDEPENDENT_AMBULATORY_CARE_PROVIDER_SITE_OTHER): Payer: BLUE CROSS/BLUE SHIELD | Admitting: Nurse Practitioner

## 2015-08-18 VITALS — BP 122/82 | Ht 62.0 in | Wt 248.2 lb

## 2015-08-18 DIAGNOSIS — R609 Edema, unspecified: Secondary | ICD-10-CM

## 2015-08-18 DIAGNOSIS — G43009 Migraine without aura, not intractable, without status migrainosus: Secondary | ICD-10-CM

## 2015-08-18 DIAGNOSIS — Z13220 Encounter for screening for lipoid disorders: Secondary | ICD-10-CM

## 2015-08-18 DIAGNOSIS — R5383 Other fatigue: Secondary | ICD-10-CM | POA: Diagnosis not present

## 2015-08-18 DIAGNOSIS — G43909 Migraine, unspecified, not intractable, without status migrainosus: Secondary | ICD-10-CM | POA: Insufficient documentation

## 2015-08-18 DIAGNOSIS — K219 Gastro-esophageal reflux disease without esophagitis: Secondary | ICD-10-CM | POA: Diagnosis not present

## 2015-08-18 MED ORDER — RIZATRIPTAN BENZOATE 10 MG PO TABS: 10.0000 mg | ORAL_TABLET | ORAL | Status: DC | PRN

## 2015-08-18 MED ORDER — TOPIRAMATE 100 MG PO TABS: ORAL_TABLET | ORAL | Status: DC

## 2015-08-18 MED ORDER — HYDROCHLOROTHIAZIDE 25 MG PO TABS: 25.0000 mg | ORAL_TABLET | Freq: Every day | ORAL | Status: DC

## 2015-08-18 MED ORDER — PANTOPRAZOLE SODIUM 40 MG PO TBEC: 40.0000 mg | DELAYED_RELEASE_TABLET | Freq: Every day | ORAL | Status: DC

## 2015-08-21 ENCOUNTER — Encounter: Payer: Self-pay | Admitting: Nurse Practitioner

## 2015-08-21 LAB — HEPATIC FUNCTION PANEL
ALT: 16 IU/L (ref 0–32)
AST: 17 IU/L (ref 0–40)
Albumin: 4.4 g/dL (ref 3.5–5.5)
Alkaline Phosphatase: 53 IU/L (ref 39–117)
Bilirubin Total: 0.5 mg/dL (ref 0.0–1.2)
Bilirubin, Direct: 0.15 mg/dL (ref 0.00–0.40)
Total Protein: 7.1 g/dL (ref 6.0–8.5)

## 2015-08-21 LAB — LIPID PANEL
Chol/HDL Ratio: 3 ratio units (ref 0.0–4.4)
Cholesterol, Total: 162 mg/dL (ref 100–199)
HDL: 54 mg/dL (ref >39)
LDL Calculated: 93 mg/dL (ref 0–99)
Triglycerides: 73 mg/dL (ref 0–149)
VLDL Cholesterol Cal: 15 mg/dL (ref 5–40)

## 2015-08-21 LAB — BASIC METABOLIC PANEL
BUN/Creatinine Ratio: 10 (ref 9–23)
BUN: 9 mg/dL (ref 6–20)
CO2: 24 mmol/L (ref 18–29)
Calcium: 10.3 mg/dL — ABNORMAL HIGH (ref 8.7–10.2)
Chloride: 102 mmol/L (ref 96–106)
Creatinine, Ser: 0.86 mg/dL (ref 0.57–1.00)
GFR calc Af Amer: 102 mL/min/{1.73_m2} (ref >59)
GFR calc non Af Amer: 88 mL/min/{1.73_m2} (ref >59)
Glucose: 82 mg/dL (ref 65–99)
Potassium: 4.6 mmol/L (ref 3.5–5.2)
Sodium: 141 mmol/L (ref 134–144)

## 2015-08-21 LAB — CBC WITH DIFFERENTIAL/PLATELET
Basophils Absolute: 0 10*3/uL (ref 0.0–0.2)
Basos: 0 %
EOS (ABSOLUTE): 0.1 10*3/uL (ref 0.0–0.4)
Eos: 1 %
Hematocrit: 40.2 % (ref 34.0–46.6)
Hemoglobin: 12.8 g/dL (ref 11.1–15.9)
Immature Grans (Abs): 0 10*3/uL (ref 0.0–0.1)
Immature Granulocytes: 0 %
Lymphocytes Absolute: 2.5 10*3/uL (ref 0.7–3.1)
Lymphs: 44 %
MCH: 27.8 pg (ref 26.6–33.0)
MCHC: 31.8 g/dL (ref 31.5–35.7)
MCV: 87 fL (ref 79–97)
Monocytes Absolute: 0.3 10*3/uL (ref 0.1–0.9)
Monocytes: 5 %
Neutrophils Absolute: 2.8 10*3/uL (ref 1.4–7.0)
Neutrophils: 50 %
Platelets: 233 10*3/uL (ref 150–379)
RBC: 4.61 x10E6/uL (ref 3.77–5.28)
RDW: 13.4 % (ref 12.3–15.4)
WBC: 5.7 10*3/uL (ref 3.4–10.8)

## 2015-08-21 LAB — TSH: TSH: 1.57 u[IU]/mL (ref 0.450–4.500)

## 2015-08-21 LAB — VITAMIN D 25 HYDROXY (VIT D DEFICIENCY, FRACTURES): Vit D, 25-Hydroxy: 8.2 ng/mL — ABNORMAL LOW (ref 30.0–100.0)

## 2015-08-21 NOTE — Progress Notes (Signed)
Subjective:  Presents for c/o swelling in the lower legs that occurred a few days ago. Has now resolved. Denies any excessive sodium intake. No CP, SOB or orthopnea. Also having a flare up of her migraines for the first time in several years. Having daily tension headaches with frequent migraines. No change in migraine symptomatology. Has been on Topamax and Maxalt in the past. Has IUD for birth control. No plans for pregnancy. Has been under significant stress. Also has noticed flare up of acid reflux. Some caffeine intake. Non smoker. Has taken OTC meds such as Excedrin migraine on a regular basis for headaches.   Objective:   BP 122/82 mmHg  Ht 5\' 2"  (1.575 m)  Wt 248 lb 3.2 oz (112.583 kg)  BMI 45.38 kg/m2 NAD. Alert, oriented. Lungs clear. Heart RRR. No murmur. Lower extremities no edema. Patient has a picture on her phone of significant edema from several days ago. Abdomen soft, non distended non tender.   Assessment:  Problem List Items Addressed This Visit      Cardiovascular and Mediastinum   Migraine - Primary   Relevant Medications   rizatriptan (MAXALT) 10 MG tablet   hydrochlorothiazide (HYDRODIURIL) 25 MG tablet   topiramate (TOPAMAX) 100 MG tablet     Digestive   GERD (gastroesophageal reflux disease)   Relevant Medications   pantoprazole (PROTONIX) 40 MG tablet   Other Relevant Orders   CBC with Differential/Platelet   Hepatic function panel   Basic metabolic panel   TSH   VITAMIN D 25 Hydroxy (Vit-D Deficiency, Fractures)    Other Visit Diagnoses    Peripheral edema        Relevant Orders    CBC with Differential/Platelet    Hepatic function panel    Basic metabolic panel    TSH    VITAMIN D 25 Hydroxy (Vit-D Deficiency, Fractures)    Other fatigue        Relevant Orders    CBC with Differential/Platelet    VITAMIN D 25 Hydroxy (Vit-D Deficiency, Fractures)    Screening for cholesterol level        Relevant Orders    Lipid panel      Plan:  Meds  ordered this encounter  Medications  . rizatriptan (MAXALT) 10 MG tablet    Sig: Take 1 tablet (10 mg total) by mouth as needed for migraine. May repeat in 2 hours if needed; max 2 per 24 hrs    Dispense:  10 tablet    Refill:  2    Order Specific Question:  Supervising Provider    Answer:  Mikey Kirschner [[id number]]  . hydrochlorothiazide (HYDRODIURIL) 25 MG tablet    Sig: Take 1 tablet (25 mg total) by mouth daily. Prn swelling    Dispense:  30 tablet    Refill:  2    Order Specific Question:  Supervising Provider    Answer:  Mikey Kirschner [[id number]]  . pantoprazole (PROTONIX) 40 MG tablet    Sig: Take 1 tablet (40 mg total) by mouth daily. Prn acid reflux    Dispense:  30 tablet    Refill:  2    Order Specific Question:  Supervising Provider    Answer:  Mikey Kirschner [[id number]]  . topiramate (TOPAMAX) 100 MG tablet    Sig: 1/2 tab po BID to prevent migraines    Dispense:  30 tablet    Refill:  2    Order Specific Question:  Supervising Provider  Answer:  Mikey Kirschner [[id number]]   Reviewed paper chart; restart Topamax, maxalt and Protonix. Defers daily med for anxiety and stress at this time. Slowly wean off all OTC analgesics. Advised no pregnancy while on Topamax. Decrease caffeine intake. Watch sodium intake. Warning signs reviewed. Cut back to prn dosing on Protonix once symptoms have improved.  Return in about 3 months (around 11/18/2015) for recheck. Call back sooner if any problems or no improvement in headaches.

## 2015-08-23 ENCOUNTER — Other Ambulatory Visit: Payer: Self-pay | Admitting: Nurse Practitioner

## 2015-08-23 ENCOUNTER — Telehealth: Payer: Self-pay | Admitting: Nurse Practitioner

## 2015-08-23 DIAGNOSIS — E559 Vitamin D deficiency, unspecified: Secondary | ICD-10-CM | POA: Insufficient documentation

## 2015-08-23 MED ORDER — VITAMIN D (ERGOCALCIFEROL) 1.25 MG (50000 UNIT) PO CAPS: 50000.0000 [IU] | ORAL_CAPSULE | ORAL | Status: DC

## 2015-08-23 NOTE — Telephone Encounter (Signed)
Notified patient unless the patient knows of another similar med that is covered, recommend OTC med such as Prilosec, Omeprazole, Nexium etc. Patient verbalized understanding.

## 2015-08-23 NOTE — Telephone Encounter (Signed)
Received a fax from patient's pharmacy that Protonix is not covered under patient's insurance. Cost out of pocket is $71.31 for a 30 day supply. Please advise?

## 2015-08-23 NOTE — Telephone Encounter (Signed)
Unless the patient knows of another similar med that is covered, recommend OTC med such as Prilosec, Omeprazole, Nexium etc.

## 2015-11-15 ENCOUNTER — Ambulatory Visit: Payer: BLUE CROSS/BLUE SHIELD | Admitting: Nurse Practitioner

## 2015-12-03 ENCOUNTER — Ambulatory Visit (INDEPENDENT_AMBULATORY_CARE_PROVIDER_SITE_OTHER): Payer: BLUE CROSS/BLUE SHIELD | Admitting: Nurse Practitioner

## 2015-12-03 ENCOUNTER — Encounter: Payer: Self-pay | Admitting: Nurse Practitioner

## 2015-12-03 VITALS — BP 130/74 | Temp 98.8°F | Ht 62.0 in | Wt 248.5 lb

## 2015-12-03 DIAGNOSIS — M79674 Pain in right toe(s): Secondary | ICD-10-CM | POA: Diagnosis not present

## 2015-12-03 DIAGNOSIS — M79675 Pain in left toe(s): Secondary | ICD-10-CM | POA: Diagnosis not present

## 2015-12-03 NOTE — Patient Instructions (Addendum)
Tea tree oil Fungal nail treatment with undecylenic acid (this helps inflammation and pain); try this first since it is not expensive; use before trying tea tree oil

## 2015-12-04 ENCOUNTER — Encounter: Payer: Self-pay | Admitting: Nurse Practitioner

## 2015-12-04 NOTE — Progress Notes (Signed)
Subjective:  Presents for c/o pain along the lateral edge of the right great toe. Same area is tender on the left side but not as bad. No drainage. No edema.   Objective:   BP 130/74   Temp 98.8 F (37.1 C) (Oral)   Ht 5\' 2"  (1.575 m)   Wt 248 lb 8 oz (112.7 kg)   BMI 45.45 kg/m  All of the toenails are painted but no obvious thickened or yellowed nails. No obvious nail bed separation. No edema, erythema or drainage.   Assessment: Toe pain, bilateral   Plan: apply OTC undecylenic acid as directed to cover for possible fungal infection. Call back if persists or if signs of infection.

## 2016-02-10 ENCOUNTER — Ambulatory Visit (INDEPENDENT_AMBULATORY_CARE_PROVIDER_SITE_OTHER): Payer: BLUE CROSS/BLUE SHIELD | Admitting: Obstetrics and Gynecology

## 2016-02-10 ENCOUNTER — Encounter: Payer: Self-pay | Admitting: Obstetrics and Gynecology

## 2016-02-10 DIAGNOSIS — R102 Pelvic and perineal pain: Secondary | ICD-10-CM

## 2016-02-10 DIAGNOSIS — R109 Unspecified abdominal pain: Secondary | ICD-10-CM | POA: Diagnosis not present

## 2016-02-10 DIAGNOSIS — Z975 Presence of (intrauterine) contraceptive device: Secondary | ICD-10-CM | POA: Diagnosis not present

## 2016-02-17 NOTE — Progress Notes (Signed)
[facility] Visit  @DATE @            Patient name: Brandi Dunn MRN [id number]  Date of birth: [birth year]  CC & HPI:  [name] is a 36 y.o. female presenting today for Follow-up regarding abdominal pain. She had an IUD placed a year ago which she thinks may be the problem but today she is absolutely fine  ROS:  ROS No fever chills discharge  Pertinent History Reviewed:   Reviewed: Significant for Satisfied with IUD Medical         Past Medical History:  Diagnosis Date  . GERD (gastroesophageal reflux disease)   . H/O renal calculi   . Migraine    last migraine 03/24/11  . 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 CHOLECYSTECTOMY  04/12/2011   Procedure: LAPAROSCOPIC CHOLECYSTECTOMY;  Surgeon: Jamesetta So, MD;  Location: [facility];  Service: General;  Laterality: N/A;  . CYSTOSCOPY W/ URETERAL STENT PLACEMENT Left 09/10/2012   Procedure: CYSTOSCOPY WITH LEFT RETROGRADE PYELOGRAM; LEFT URETERAL STENT PLACEMENT;  Surgeon: Marissa Nestle, MD;  Location: [facility];  Service: Urology;  Laterality: Left;  . DILATION AND CURETTAGE OF UTERUS  2000?   x2, [facility]  . STONE EXTRACTION WITH BASKET Left 09/10/2012   Procedure: BALLOON DILATATION LEFT URETER; LEFT URETEROSCOPIC STONE EXTRACTION WITH BASKET;  Surgeon: Marissa Nestle, MD;  Location: [facility];  Service: Urology;  Laterality: Left;   Medications: Reviewed & Updated - see associated section                       Current Outpatient Prescriptions:  .  levonorgestrel (MIRENA) 20 MCG/24HR IUD, 1 each by Intrauterine route once., Disp: , Rfl:  .  pantoprazole (PROTONIX) 40 MG tablet, Take 1 tablet (40 mg total) by mouth daily. Prn acid reflux, Disp: 30 tablet, Rfl: 2 .  rizatriptan (MAXALT) 10 MG tablet, Take 1 tablet (10 mg total) by mouth as needed for migraine. May repeat in 2 hours if needed; max 2 per 24 hrs, Disp: 10  tablet, Rfl: 2 .  hydrochlorothiazide (HYDRODIURIL) 25 MG tablet, Take 1 tablet (25 mg total) by mouth daily. Prn swelling (Patient not taking: Reported on 02/10/2016), Disp: 30 tablet, Rfl: 2 .  topiramate (TOPAMAX) 100 MG tablet, 1/2 tab po BID to prevent migraines (Patient not taking: Reported on 02/10/2016), Disp: 30 tablet, Rfl: 2 .  Vitamin D, Ergocalciferol, (DRISDOL) 50000 units CAPS capsule, Take 1 capsule (50,000 Units total) by mouth every 7 (seven) days. (Patient not taking: Reported on 02/10/2016), Disp: 4 capsule, Rfl: 2   Social History: Reviewed -  reports that she has never smoked. She has never used smokeless tobacco.  Objective Findings:  Vitals: Blood pressure (!) 110/53, pulse 81, weight 247 lb (112 kg), last menstrual period 02/05/2016.  Physical Examination: General appearance - alert, well appearing, and in no distress and oriented to person, place, and time Mental status - alert, oriented to person, place, and time, normal mood, behavior, speech, dress, motor activity, and thought processes Abdomen - soft, nontender, nondistended, no masses or organomegaly Pelvic - normal external genitalia, vulva, vagina, cervix, uterus and adnexa, ID IUD string present   Assessment & Plan:  A:  1. Unable to determine source of pelvic pain at this time. Normal exam the state  P:  1. Patient has to make arrangements that she calls while she is having the next acute pain episode and will try to see her while pain is still present as a work in appointment   As of 04/06/2016 the patient is again asymptomatic and has had no pain episodes today

## 2016-04-06 ENCOUNTER — Telehealth: Payer: Self-pay | Admitting: Obstetrics and Gynecology

## 2016-04-06 DIAGNOSIS — R102 Pelvic and perineal pain: Secondary | ICD-10-CM | POA: Insufficient documentation

## 2016-04-06 NOTE — Telephone Encounter (Signed)
Patient supplied me with the information necessary to document her visit in January adequate

## 2017-02-02 ENCOUNTER — Ambulatory Visit (INDEPENDENT_AMBULATORY_CARE_PROVIDER_SITE_OTHER): Payer: BLUE CROSS/BLUE SHIELD | Admitting: Nurse Practitioner

## 2017-02-02 VITALS — BP 118/80 | Ht 62.0 in | Wt 244.0 lb

## 2017-02-02 DIAGNOSIS — M7522 Bicipital tendinitis, left shoulder: Secondary | ICD-10-CM

## 2017-02-02 DIAGNOSIS — G44229 Chronic tension-type headache, not intractable: Secondary | ICD-10-CM | POA: Diagnosis not present

## 2017-02-02 DIAGNOSIS — Z975 Presence of (intrauterine) contraceptive device: Secondary | ICD-10-CM | POA: Diagnosis not present

## 2017-02-02 DIAGNOSIS — E559 Vitamin D deficiency, unspecified: Secondary | ICD-10-CM

## 2017-02-02 DIAGNOSIS — N921 Excessive and frequent menstruation with irregular cycle: Secondary | ICD-10-CM

## 2017-02-02 MED ORDER — CYCLOBENZAPRINE HCL 10 MG PO TABS: 10.0000 mg | ORAL_TABLET | Freq: Three times a day (TID) | ORAL | 0 refills | Status: DC | PRN

## 2017-02-02 MED ORDER — NAPROXEN 500 MG PO TABS: 500.0000 mg | ORAL_TABLET | Freq: Two times a day (BID) | ORAL | 0 refills | Status: DC

## 2017-02-02 NOTE — Patient Instructions (Signed)
Lidocaine patch biofreeze Icy Hot Smart Relief TENS unit Massage therapy  Conan Bowens at [employer] [facility]

## 2017-02-03 ENCOUNTER — Encounter: Payer: Self-pay | Admitting: Nurse Practitioner

## 2017-02-03 NOTE — Progress Notes (Signed)
Subjective: Presents for complaints of left shoulder pain that began about a month ago.  No specific history of injury but began after 2 days of repetitive heavy lifting over many hours.  Pain with certain movements.  Difficulty sleeping due to pain.  Has applied ice pack and heating pad.  Has had some breakthrough bleeding/menstrual cycle on her Mirena.  Has been having daily tension headaches.  Stopped her Topamax for migraines.  Her last migraine was about a week ago that lasted about 3 days.  Did not have her Maxalt with her when the headache began.  With her regular daily headaches has nausea no vomiting.  No visual changes.  No numbness or weakness of the face arms or legs.  No difficulty speaking or swallowing.  Think she needs to have her prescription changed on her eyeglasses.  Blood pressure has been normal outside of the office.  Has a history of vitamin D deficiency.  Took prescription vitamin D but does not take OTC supplement.  Objective:   BP 118/80   Ht 5\' 2"  (1.575 m)   Wt 244 lb (110.7 kg)   BMI 44.63 kg/m  NAD.  Alert, oriented.  TMs mild clear effusion, no erythema.  Pharynx clear.  Neck supple with minimal adenopathy.  Lungs clear.  Heart regular rate and rhythm.  Extremely tight tender muscles noted along the upper back and neck area especially on the left side.  Distinct tenderness in the anterior shoulder joint line.  Can perform full passive and active rotation of the shoulder with some tenderness.  Can perform full range of motion of the left shoulder with some tenderness.  Hand and arm strength 5+ bilateral.  Assessment:   Problem List Items Addressed This Visit      Other   Vitamin D deficiency   Relevant Orders   VITAMIN D 25 Hydroxy (Vit-D Deficiency, Fractures)    Other Visit Diagnoses    Biceps tendinitis of left shoulder    -  Primary   Breakthrough bleeding associated with intrauterine device (IUD)       Relevant Orders   CBC with Differential/Platelet   Chronic tension-type headache, not intractable       Relevant Medications   naproxen (NAPROSYN) 500 MG tablet   cyclobenzaprine (FLEXERIL) 10 MG tablet   Other Relevant Orders   CBC with Differential/Platelet       Plan:   Meds ordered this encounter  Medications  . naproxen (NAPROSYN) 500 MG tablet    Sig: Take 1 tablet (500 mg total) by mouth 2 (two) times daily with a meal.    Dispense:  30 tablet    Refill:  0    Order Specific Question:   Supervising Provider    Answer:   Mikey Kirschner [[id number]]  . cyclobenzaprine (FLEXERIL) 10 MG tablet    Sig: Take 1 tablet (10 mg total) by mouth 3 (three) times daily as needed for muscle spasms.    Dispense:  30 tablet    Refill:  0    Order Specific Question:   Supervising Provider    Answer:   Mikey Kirschner [[id number]]   Patient chooses not to take daily Topamax.  Continue Maxalt as directed at onset of migraine.  Drowsiness precautions with Flexeril, mainly for nighttime use. Lidocaine patch biofreeze Icy Hot Smart Relief TENS unit Massage therapy  Given copy of shoulder exercises.  Call back in 10-14 days if no improvement, sooner if any problems.  Lab work pending.  Continue follow-up with gynecology regarding her IUD. 25 minutes was spent with the patient. Greater than half the time was spent in discussion and answering questions and counseling regarding the issues that the patient came in for today.

## 2017-02-09 ENCOUNTER — Other Ambulatory Visit: Payer: Self-pay | Admitting: Nurse Practitioner

## 2017-02-09 LAB — CBC WITH DIFFERENTIAL/PLATELET
Basophils Absolute: 0 10*3/uL (ref 0.0–0.2)
Basos: 0 %
EOS (ABSOLUTE): 0.1 10*3/uL (ref 0.0–0.4)
Eos: 1 %
Hematocrit: 40.2 % (ref 34.0–46.6)
Hemoglobin: 12.8 g/dL (ref 11.1–15.9)
Immature Grans (Abs): 0 10*3/uL (ref 0.0–0.1)
Immature Granulocytes: 0 %
Lymphocytes Absolute: 3.4 10*3/uL — ABNORMAL HIGH (ref 0.7–3.1)
Lymphs: 44 %
MCH: 28.6 pg (ref 26.6–33.0)
MCHC: 31.8 g/dL (ref 31.5–35.7)
MCV: 90 fL (ref 79–97)
Monocytes Absolute: 0.4 10*3/uL (ref 0.1–0.9)
Monocytes: 5 %
Neutrophils Absolute: 3.8 10*3/uL (ref 1.4–7.0)
Neutrophils: 50 %
Platelets: 210 10*3/uL (ref 150–379)
RBC: 4.48 x10E6/uL (ref 3.77–5.28)
RDW: 13.8 % (ref 12.3–15.4)
WBC: 7.8 10*3/uL (ref 3.4–10.8)

## 2017-02-09 LAB — VITAMIN D 25 HYDROXY (VIT D DEFICIENCY, FRACTURES): Vit D, 25-Hydroxy: 5.3 ng/mL — ABNORMAL LOW (ref 30.0–100.0)

## 2017-02-09 MED ORDER — VITAMIN D (ERGOCALCIFEROL) 1.25 MG (50000 UNIT) PO CAPS: 50000.0000 [IU] | ORAL_CAPSULE | ORAL | 5 refills | Status: DC

## 2018-03-13 ENCOUNTER — Ambulatory Visit (INDEPENDENT_AMBULATORY_CARE_PROVIDER_SITE_OTHER): Payer: BLUE CROSS/BLUE SHIELD | Admitting: Advanced Practice Midwife

## 2018-03-13 ENCOUNTER — Encounter: Payer: Self-pay | Admitting: Advanced Practice Midwife

## 2018-03-13 ENCOUNTER — Other Ambulatory Visit (HOSPITAL_COMMUNITY)
Admission: RE | Admit: 2018-03-13 | Discharge: 2018-03-13 | Disposition: A | Discharge status: Home / Self Care | Payer: BLUE CROSS/BLUE SHIELD | Source: Ambulatory Visit | Attending: Advanced Practice Midwife | Admitting: Advanced Practice Midwife

## 2018-03-13 VITALS — BP 127/71 | HR 68 | Ht 65.0 in | Wt 241.0 lb

## 2018-03-13 DIAGNOSIS — Z01419 Encounter for gynecological examination (general) (routine) without abnormal findings: Secondary | ICD-10-CM | POA: Diagnosis not present

## 2018-03-13 DIAGNOSIS — E559 Vitamin D deficiency, unspecified: Secondary | ICD-10-CM

## 2018-03-13 NOTE — Patient Instructions (Signed)
[place]

## 2018-03-13 NOTE — Progress Notes (Signed)
Brandi Dunn 38 y.o.  Vitals:   03/13/18 1618  BP: 127/71  Pulse: 68     Filed Weights   03/13/18 1618  Weight: 241 lb (109.3 kg)    Past Medical History: Past Medical History:  Diagnosis Date  . GERD (gastroesophageal reflux disease)   . H/O renal calculi   . Migraine    last migraine 03/24/11  . 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 CHOLECYSTECTOMY  04/12/2011   Procedure: LAPAROSCOPIC CHOLECYSTECTOMY;  Surgeon: Jamesetta So, MD;  Location: [facility];  Service: General;  Laterality: N/A;  . CYSTOSCOPY W/ URETERAL STENT PLACEMENT Left 09/10/2012   Procedure: CYSTOSCOPY WITH LEFT RETROGRADE PYELOGRAM; LEFT URETERAL STENT PLACEMENT;  Surgeon: Marissa Nestle, MD;  Location: [facility];  Service: Urology;  Laterality: Left;  . DILATION AND CURETTAGE OF UTERUS  2000?   x2, [facility]  . STONE EXTRACTION WITH BASKET Left 09/10/2012   Procedure: BALLOON DILATATION LEFT URETER; LEFT URETEROSCOPIC STONE EXTRACTION WITH BASKET;  Surgeon: Marissa Nestle, MD;  Location: [facility];  Service: Urology;  Laterality: Left;    Family History: Family History  Problem Relation Age of Onset  . Hypertension Mother   . Hyperlipidemia Mother   . Hypertension Father   . Anesthesia problems Neg Hx   . Hypotension Neg Hx   . Malignant hyperthermia Neg Hx   . Pseudochol deficiency Neg Hx     Social History: Social History   Tobacco Use  . Smoking status: Never Smoker  . Smokeless tobacco: Never Used  Substance Use Topics  . Alcohol use: No    Alcohol/week: 0.0 standard drinks  . Drug use: No    Allergies: No Known Allergies    Current Outpatient Medications:  .  levonorgestrel (MIRENA) 20 MCG/24HR IUD, 1 each by Intrauterine route once., Disp: , Rfl:  .  pantoprazole (PROTONIX) 40 MG tablet, Take 1 tablet (40 mg total) by mouth daily. Prn acid reflux, Disp: 30 tablet, Rfl: 2 .  rizatriptan (MAXALT) 10 MG  tablet, Take 1 tablet (10 mg total) by mouth as needed for migraine. May repeat in 2 hours if needed; max 2 per 24 hrs, Disp: 10 tablet, Rfl: 2 .  Vitamin D, Ergocalciferol, (DRISDOL) 50000 units CAPS capsule, Take 1 capsule (50,000 Units total) by mouth every 7 (seven) days. For the next 6 months, Disp: 4 capsule, Rfl: 5 .  naproxen (NAPROSYN) 500 MG tablet, Take 1 tablet (500 mg total) by mouth 2 (two) times daily with a meal. (Patient not taking: Reported on 03/13/2018), Disp: 30 tablet, Rfl: 0 .  topiramate (TOPAMAX) 100 MG tablet, 1/2 tab po BID to prevent migraines (Patient not taking: Reported on 02/10/2016), Disp: 30 tablet, Rfl: 2  History of Present Illness: here for pap and physical.  Last pap 2015, normal.  Dx w/Vit D deficiency a few years ago.  Rx'd meds, (thought she was only supposed to take for a week or so)  Did not f/u w/labs last years. Has had this Mirena since 2017. No problems. .    Review of Systems   Patient denies any headaches, blurred vision, shortness of breath, chest pain, abdominal pain, problems with bowel movements, urination, or intercourse.   Physical Exam: General:  Well developed, well nourished, no acute distress Skin:  Warm and dry Neck:  Midline trachea, normal thyroid Lungs; Clear to auscultation bilaterally Breast:  No dominant palpable mass, retraction,  or nipple discharge Cardiovascular: Regular rate and rhythm Abdomen:  Soft, non tender, no hepatosplenomegaly Pelvic:  External genitalia is normal in appearance.  The vagina is normal in appearance.  The cervix is bulbous.  Uterus is felt to be normal size, shape, and contour.  No adnexal masses or tenderness noted.  Extremities:  No swelling or varicosities noted Psych:  No mood changes.     Impression: normal GYN exam Hx severe Vit D deficiency     Plan: if pap normal, repeat q 3 years Check Vit D level and rx accordingly. Discussed that this will most likely be a daily med STD testing per  request

## 2018-03-14 LAB — HIV ANTIBODY (ROUTINE TESTING W REFLEX): HIV Screen 4th Generation wRfx: NONREACTIVE

## 2018-03-14 LAB — RPR: RPR Ser Ql: NONREACTIVE

## 2018-03-16 LAB — CYTOLOGY - PAP
Chlamydia: NEGATIVE
Diagnosis: NEGATIVE
HPV: NOT DETECTED
Neisseria Gonorrhea: NEGATIVE

## 2018-03-19 LAB — VITAMIN D 1,25 DIHYDROXY
Vitamin D 1, 25 (OH)2 Total: 62 pg/mL
Vitamin D2 1, 25 (OH)2: 34 pg/mL
Vitamin D3 1, 25 (OH)2: 28 pg/mL

## 2018-06-26 ENCOUNTER — Encounter: Payer: Self-pay | Admitting: Family Medicine

## 2018-06-26 ENCOUNTER — Other Ambulatory Visit: Payer: Self-pay

## 2018-06-26 ENCOUNTER — Ambulatory Visit (INDEPENDENT_AMBULATORY_CARE_PROVIDER_SITE_OTHER): Payer: BLUE CROSS/BLUE SHIELD | Admitting: Family Medicine

## 2018-06-26 VITALS — Ht 65.0 in | Wt 265.0 lb

## 2018-06-26 DIAGNOSIS — M533 Sacrococcygeal disorders, not elsewhere classified: Secondary | ICD-10-CM | POA: Diagnosis not present

## 2018-06-26 MED ORDER — NAPROXEN 500 MG PO TABS
500.0000 mg | ORAL_TABLET | Freq: Two times a day (BID) | ORAL | 1 refills | Status: DC
Start: 2018-06-26 — End: 2019-02-13

## 2018-06-26 MED ORDER — HYDROCODONE-ACETAMINOPHEN 5-325 MG PO TABS: 1.0000 | ORAL_TABLET | ORAL | 0 refills | Status: DC | PRN

## 2018-06-26 NOTE — Progress Notes (Signed)
   Subjective:    Patient ID: Brandi Dunn, female    DOB: May 28, 1980, 38 y.o.   MRN: [id number] In person visit HPI   Patient arrives with right hip pain tat started yesterday. Patient states she heard a pop when she went to pick up something yesterday and has had pain since. Patient with significant hip pain present since yesterday works at [employer] does a lot of lifting she relates a lot of stiffness pain discomfort with movement.  Relates certain movements make it hurt more the pain is in the right lower back radiates to the right calf does not go down the leg she has tried some stretching has not tried any medications PMH benign   Review of Systems  Constitutional: Negative for activity change, fatigue and fever.  HENT: Negative for congestion and rhinorrhea.   Respiratory: Negative for cough, chest tightness and shortness of breath.   Cardiovascular: Negative for chest pain and leg swelling.  Gastrointestinal: Negative for abdominal pain and nausea.  Musculoskeletal: Positive for back pain. Negative for arthralgias.  Skin: Negative for color change.  Neurological: Negative for dizziness and headaches.  Psychiatric/Behavioral: Negative for agitation and behavioral problems.       Objective:   Physical Exam Vitals signs reviewed.  Constitutional:      General: She is not in acute distress. HENT:     Head: Normocephalic.  Cardiovascular:     Rate and Rhythm: Normal rate and regular rhythm.     Heart sounds: Normal heart sounds. No murmur.  Pulmonary:     Effort: Pulmonary effort is normal.     Breath sounds: Normal breath sounds.  Lymphadenopathy:     Cervical: No cervical adenopathy.  Neurological:     Mental Status: She is alert.  Psychiatric:        Behavior: Behavior normal.    Significant tenderness in the right lower back region does not radiate down the leg negative straight leg raise pain with rotation flexion and extension   No x-rays or CAT scan indicated  currently    Assessment & Plan:  Probable sacroiliac strain should gradually get better with stretching pain medication given for infrequent use not for long-term use anti-inflammatory twice a day over the next couple weeks warning signs regarding sciatica given follow-up if not improving out of work through Sunday avoid lifting information regarding stretches were given do stretching on a regular basis

## 2018-09-18 ENCOUNTER — Ambulatory Visit (INDEPENDENT_AMBULATORY_CARE_PROVIDER_SITE_OTHER): Payer: BC Managed Care – PPO | Admitting: Family Medicine

## 2018-09-18 ENCOUNTER — Other Ambulatory Visit: Payer: Self-pay

## 2018-09-18 ENCOUNTER — Encounter: Payer: Self-pay | Admitting: Family Medicine

## 2018-09-18 VITALS — Temp 97.5°F | Ht 65.0 in | Wt 240.0 lb

## 2018-09-18 DIAGNOSIS — U071 COVID-19: Secondary | ICD-10-CM | POA: Diagnosis not present

## 2018-09-18 DIAGNOSIS — Z20822 Contact with and (suspected) exposure to covid-19: Secondary | ICD-10-CM

## 2018-09-18 DIAGNOSIS — R6889 Other general symptoms and signs: Secondary | ICD-10-CM | POA: Diagnosis not present

## 2018-09-18 NOTE — Progress Notes (Signed)
   Subjective:    Patient ID: [name], female    DOB: [birth year], 38 y.o.   MRN: [id number] Audio plus video Cough This is a new problem. Episode onset: 2 days. The cough is non-productive. Associated symptoms comments: Headache, weakness, nose stopped up. Treatments tried: ibuprofen.   Virtual Visit via Video Note  I connected with KENYAH LUBA on 09/18/18 at  9:30 AM EDT by a video enabled telemedicine application and verified that I am speaking with the correct person using two identifiers.  Location: Patient: home Provider: office   I discussed the limitations of evaluation and management by telemedicine and the availability of in person appointments. The patient expressed understanding and agreed to proceed.  History of Present Illness:    Observations/Objective:   Assessment and Plan:   Follow Up Instructions:    I discussed the assessment and treatment plan with the patient. The patient was provided an opportunity to ask questions and all were answered. The patient agreed with the plan and demonstrated an understanding of the instructions.   The patient was advised to call back or seek an in-person evaluation if the symptoms worsen or if the condition fails to improve as anticipated.  I provided 25 minutes of non-face-to-face time during this encounter.  Patient works at [employer].  2 days of symptoms.  In fact stayed out of work yesterday with headache.  Achiness cough diminished energy.  No stopped  Review of Systems  Respiratory: Positive for cough.        Objective:   Physical Exam  Virtual      Assessment & Plan:  Impression suspected COVID-19 infection.  Discussed.  Quarantine measures discussed.  Symptom care discussed.  Patient understandably concerned about this.  Numerous questions answered.  Nature of virus discussed.  Petra Kuba of protecting others within the family discussed.  Work considerations discussed.  COVID-19 testing.  Further  recommendations based on results.  Greater than 50% of this 25 minute face to face visit was spent in counseling and discussion and coordination of care regarding the above diagnosis/diagnosies

## 2018-09-19 LAB — NOVEL CORONAVIRUS, NAA: SARS-CoV-2, NAA: DETECTED — AB

## 2018-09-20 ENCOUNTER — Ambulatory Visit (INDEPENDENT_AMBULATORY_CARE_PROVIDER_SITE_OTHER): Payer: BC Managed Care – PPO | Admitting: Family Medicine

## 2018-09-20 DIAGNOSIS — U071 COVID-19: Secondary | ICD-10-CM | POA: Diagnosis not present

## 2018-09-20 NOTE — Progress Notes (Signed)
   Subjective:    Patient ID: Brandi Dunn, female    DOB: 04-Mar-1980, 38 y.o.   MRN: [id number]  HPI Patient has had about 5 days worth of sickness had COVID testing which was positive patient now states no longer having fever body aches or headaches but does relate having nasal congestion as well as loss of sense of taste as well as loss of sense of smell denies any wheezing difficulty breathing sweats chills vomiting energy level is fair has stayed home from work.  No other particular symptoms. Patient calls to discuss concerns regarding her positive Covid results.  Virtual Visit via Video Note  I connected with LATIFAH PADIN on 09/20/18 at  3:50 PM EDT by a video enabled telemedicine application and verified that I am speaking with the correct person using two identifiers.  Location: Patient: home Provider: office   I discussed the limitations of evaluation and management by telemedicine and the availability of in person appointments. The patient expressed understanding and agreed to proceed.  History of Present Illness:    Observations/Objective:   Assessment and Plan:   Follow Up Instructions:    I discussed the assessment and treatment plan with the patient. The patient was provided an opportunity to ask questions and all were answered. The patient agreed with the plan and demonstrated an understanding of the instructions.   The patient was advised to call back or seek an in-person evaluation if the symptoms worsen or if the condition fails to improve as anticipated.  I provided 15 minutes of non-face-to-face time during this encounter.      Review of Systems Loss of sense of smell and taste was having headaches still having congestion no body aches fever chills over the past day no wheezing or difficulty breathing no nausea vomiting diarrhea    Objective:   Physical Exam  Patient had virtual visit Appears to be in no distress Atraumatic Neuro able to  relate and oriented No apparent resp distress Color normal       Assessment & Plan:  Positive COVID infection We did discuss quarantine guidelines We also told the patient to stay at home do not go out until at least 10 days from the start of the illness with at least 3 days without fever In addition to this patient will stay out of work this week and next week Warning signs for complications were discussed in detail Sense of smell and taste should gradually come back over the course of the next week all the way through 3 to 4 weeks from now if any particular problems or issues immediately follow-up

## 2018-09-23 ENCOUNTER — Ambulatory Visit: Payer: BC Managed Care – PPO | Admitting: Family Medicine

## 2018-09-23 ENCOUNTER — Encounter: Payer: Self-pay | Admitting: Family Medicine

## 2018-10-02 ENCOUNTER — Other Ambulatory Visit: Payer: Self-pay

## 2018-10-02 ENCOUNTER — Ambulatory Visit (INDEPENDENT_AMBULATORY_CARE_PROVIDER_SITE_OTHER): Payer: BC Managed Care – PPO | Admitting: Family Medicine

## 2018-10-02 ENCOUNTER — Encounter: Payer: Self-pay | Admitting: Family Medicine

## 2018-10-02 DIAGNOSIS — R21 Rash and other nonspecific skin eruption: Secondary | ICD-10-CM | POA: Diagnosis not present

## 2018-10-02 MED ORDER — CEPHALEXIN 500 MG PO CAPS: ORAL_CAPSULE | ORAL | 0 refills | Status: DC

## 2018-10-02 NOTE — Progress Notes (Signed)
   Subjective:  Audio plus video  Patient ID: Brandi Dunn, female    DOB: [birth year], 38 y.o.   MRN: [id number]  HPI Pt has growth behind right ear. Pt states that it has been there for a couple of years. Pt states she had shown Hoyle Sauer but it was smaller at that time. At that time, the growth was not painful. Pt is now having neck pain when she turns head. Pt has not tried any treatments at this time.   Virtual Visit via Video Note  I connected with BERKLIE OLMEDA on 10/02/18 at  1:10 PM EDT by a video enabled telemedicine application and verified that I am speaking with the correct person using two identifiers.  Location: Patient: home Provider: office   I discussed the limitations of evaluation and management by telemedicine and the availability of in person appointments. The patient expressed understanding and agreed to proceed.  History of Present Illness:    Observations/Objective:   Assessment and Plan:   Follow Up Instructions:    I discussed the assessment and treatment plan with the patient. The patient was provided an opportunity to ask questions and all were answered. The patient agreed with the plan and demonstrated an understanding of the instructions.   The patient was advised to call back or seek an in-person evaluation if the symptoms worsen or if the condition fails to improve as anticipated.  I provided 18 minutes of non-face-to-face time during this encounter.   Vicente Males, LPN  Patient has history of cyst behind the ear.  Has not been a problem along the way.  Evaluated in the past.  Started to become tender.  Somewhat more swollen.  Sensitive to touch.  Worse when turning head 1 where another    Review of Systems No fever no chills no headache    Objective:   Physical Exam  Virtual     Assessment & Plan:  Impression 1 secondarily infected cyst discussed local measures discussed.  Natural history of cyst discussed.  Keflex 500 3  times daily 10 days.  Warning signs discussed  2.  COVID-19 symptoms now resolved.  Patient back to work

## 2019-02-13 ENCOUNTER — Other Ambulatory Visit: Payer: Self-pay

## 2019-02-13 ENCOUNTER — Ambulatory Visit (INDEPENDENT_AMBULATORY_CARE_PROVIDER_SITE_OTHER): Payer: BC Managed Care – PPO | Admitting: Family Medicine

## 2019-02-13 DIAGNOSIS — F411 Generalized anxiety disorder: Secondary | ICD-10-CM

## 2019-02-13 DIAGNOSIS — M25552 Pain in left hip: Secondary | ICD-10-CM | POA: Diagnosis not present

## 2019-02-13 MED ORDER — SERTRALINE HCL 50 MG PO TABS: 50.0000 mg | ORAL_TABLET | Freq: Every day | ORAL | 3 refills | Status: DC

## 2019-02-13 MED ORDER — OXYCODONE-ACETAMINOPHEN 5-325 MG PO TABS: 1.0000 | ORAL_TABLET | ORAL | 0 refills | Status: AC | PRN

## 2019-02-13 MED ORDER — ETODOLAC 400 MG PO TABS: 400.0000 mg | ORAL_TABLET | Freq: Two times a day (BID) | ORAL | 1 refills | Status: DC

## 2019-02-13 NOTE — Progress Notes (Signed)
   Subjective:    Patient ID: Brandi Dunn, female    DOB: 06-23-1980, 39 y.o.   MRN: [id number]  HPI  Patient calls with flare of right hip pain. Patient states she has been seen for this in the past and it has flared back up again. Patient states pains in the side of her hip is also lower back she denies high fever chills denies abdominal pain no dysuria she relates pain with movement pain with sitting.  Energy level subpar Virtual Visit via Video Note  I connected with LACOLE CUMBO on 02/13/19 at  2:30 PM EST by a video enabled telemedicine application and verified that I am speaking with the correct person using two identifiers.  Location: Patient: home Provider: office   I discussed the limitations of evaluation and management by telemedicine and the availability of in person appointments. The patient expressed understanding and agreed to proceed.  History of Present Illness:    Observations/Objective:   Assessment and Plan:   Follow Up Instructions:    I discussed the assessment and treatment plan with the patient. The patient was provided an opportunity to ask questions and all were answered. The patient agreed with the plan and demonstrated an understanding of the instructions.   The patient was advised to call back or seek an in-person evaluation if the symptoms worsen or if the condition fails to improve as anticipated.  I provided 16 minutes of non-face-to-face time during this encounter.      Review of Systems  Constitutional: Negative for activity change and appetite change.  HENT: Negative for congestion and rhinorrhea.   Respiratory: Negative for cough and shortness of breath.   Cardiovascular: Negative for chest pain and leg swelling.  Gastrointestinal: Negative for abdominal pain, nausea and vomiting.  Musculoskeletal: Positive for back pain.       Also right hip pain  Skin: Negative for color change.  Neurological: Negative for dizziness and  weakness.  Psychiatric/Behavioral: Negative for agitation and confusion.       Objective:   Physical Exam   Today's visit was via telephone Physical exam was not possible for this visit     Assessment & Plan:  Significant hip and back pain possibility that this is a nerve impingement such as sciatica is also possible could be a bursitis within the hip recommend referral to orthopedics go ahead with anti-inflammatory pain medication for evening use only not for long-term use caution drowsiness

## 2019-02-14 ENCOUNTER — Ambulatory Visit: Payer: BC Managed Care – PPO | Admitting: Nurse Practitioner

## 2019-02-18 ENCOUNTER — Encounter: Payer: Self-pay | Admitting: Family Medicine

## 2019-02-18 ENCOUNTER — Other Ambulatory Visit: Payer: Self-pay

## 2019-02-18 ENCOUNTER — Ambulatory Visit (INDEPENDENT_AMBULATORY_CARE_PROVIDER_SITE_OTHER): Payer: BC Managed Care – PPO | Admitting: Family Medicine

## 2019-02-18 VITALS — BP 126/88 | Temp 97.2°F | Wt 234.6 lb

## 2019-02-18 DIAGNOSIS — N644 Mastodynia: Secondary | ICD-10-CM | POA: Diagnosis not present

## 2019-02-18 NOTE — Progress Notes (Signed)
   Subjective:    Patient ID: Brandi Dunn, female    DOB: [birth year], 39 y.o.   MRN: [id number]  HPI Pt has been having right breast pain on and off for months. Pt states that sometimes it is tender to the touch. No swelling that she has noticed. Pt does not take any meds when the pain comes on.  Significant breast discomfort on the right side intermittent.  Describes it as sharp pains last sometimes several minutes.  Occurs intermittently throughout the day..  Does not wake her up at night.  Has never had this before.  Had a Aunt who had breast cancer years ago and died from it when she was in her 14s  Patient also with hip pain and discomfort we have set into motion referral to orthopedics she has not heard about the appointment yet  Review of Systems  Constitutional: Negative for activity change and appetite change.  HENT: Negative for congestion and rhinorrhea.   Respiratory: Negative for cough and shortness of breath.   Cardiovascular: Negative for chest pain and leg swelling.  Gastrointestinal: Negative for abdominal pain, nausea and vomiting.  Skin: Negative for color change.  Neurological: Negative for dizziness and weakness.  Psychiatric/Behavioral: Negative for agitation and confusion.       Objective:   Physical Exam  Lungs clear respiratory rate normal heart regular Tenderness in the left hip lateral aspect trochanter region negative straight leg raise Breast exam normal bilateral Diagnostic mammogram ultrasound recommended by radiologist     Assessment & Plan:  Probable hip bursitis orthopedic should be able to help her by doing injection we will do what we can to speed up the referral  Breast exam is benign but because of breast pain set her up for a mammogram and ultrasound diagnostic await the results

## 2019-02-24 ENCOUNTER — Encounter: Payer: Self-pay | Admitting: Family Medicine

## 2019-03-04 ENCOUNTER — Other Ambulatory Visit: Payer: Self-pay

## 2019-03-04 ENCOUNTER — Encounter (HOSPITAL_COMMUNITY): Payer: BLUE CROSS/BLUE SHIELD

## 2019-03-04 ENCOUNTER — Ambulatory Visit (HOSPITAL_COMMUNITY)
Admission: RE | Admit: 2019-03-04 | Discharge: 2019-03-04 | Disposition: A | Discharge status: Home / Self Care | Payer: BC Managed Care – PPO | Source: Ambulatory Visit | Attending: Family Medicine | Admitting: Family Medicine

## 2019-03-04 ENCOUNTER — Ambulatory Visit (HOSPITAL_COMMUNITY): Admission: RE | Admit: 2019-03-04 | Payer: BC Managed Care – PPO | Source: Ambulatory Visit

## 2019-03-04 DIAGNOSIS — N644 Mastodynia: Secondary | ICD-10-CM | POA: Diagnosis not present

## 2019-03-04 IMAGING — US US BREAST*L* LIMITED INC AXILLA
1 series · 6 of 6 positions shown · non-contrast
Comparison: None.

CLINICAL DATA: 30-year-old female with nonfocal right pain
throughout the right breast.

EXAM:
DIGITAL DIAGNOSTIC BILATERAL MAMMOGRAM WITH CAD AND TOMO
LEFT BREAST ULTRASOUND

[Series 1: us breast*left* limited inc axilla · 6 of 6 slices shown]
[im 1/6]
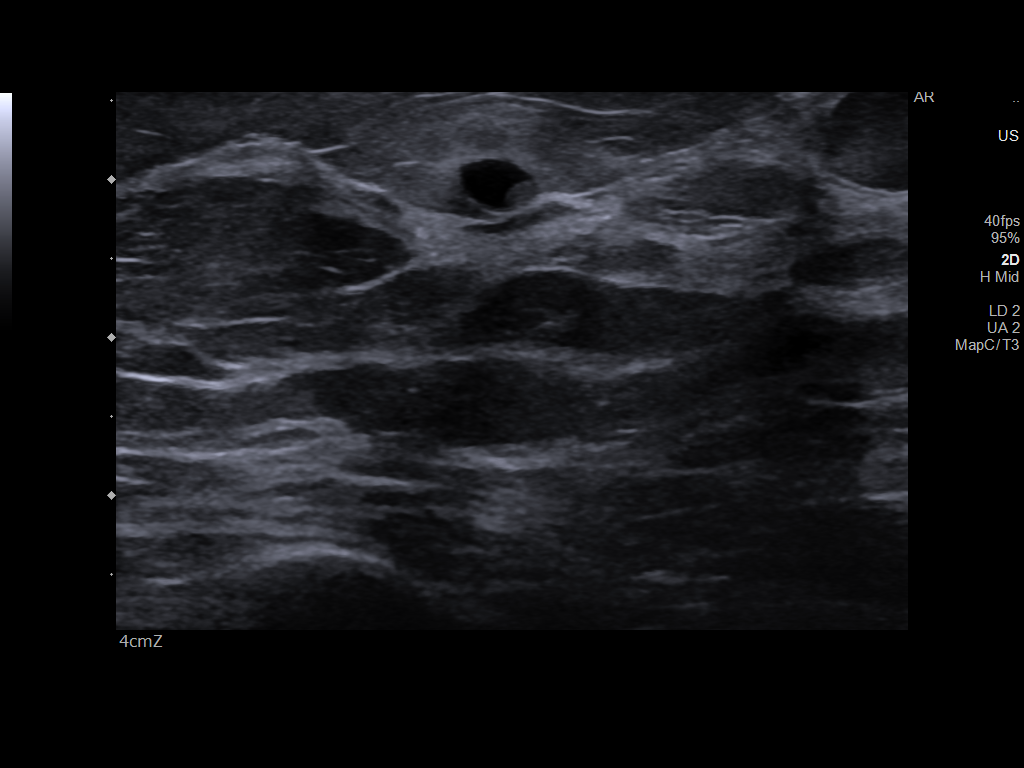
[im 2/6]
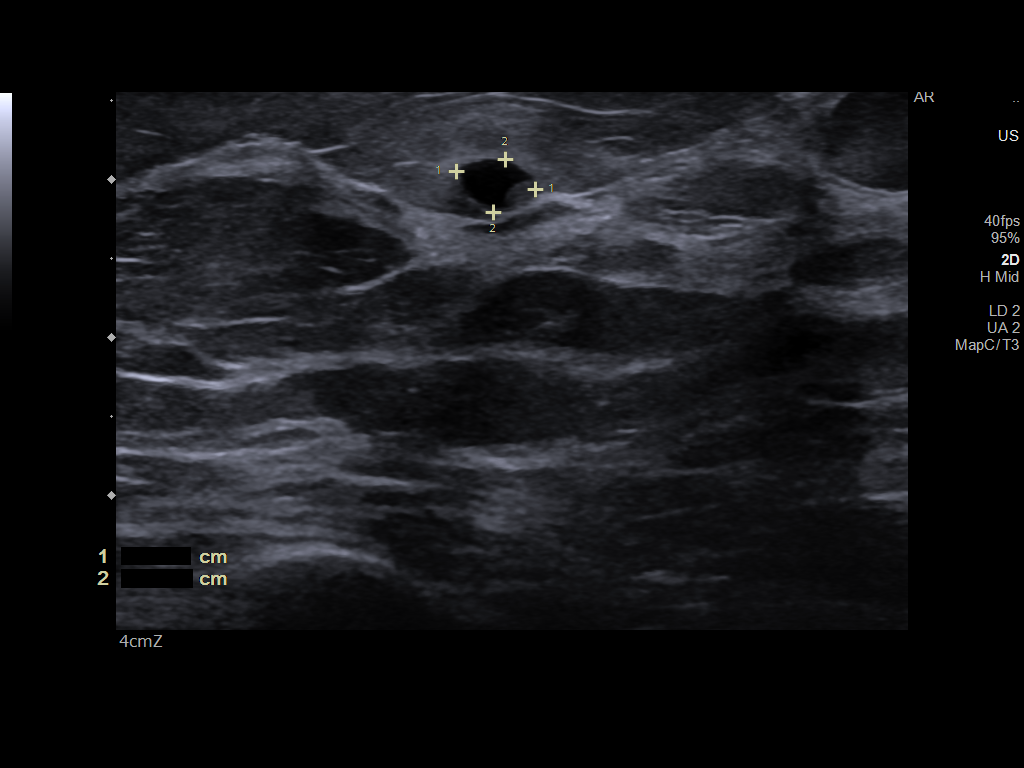
[im 3/6]
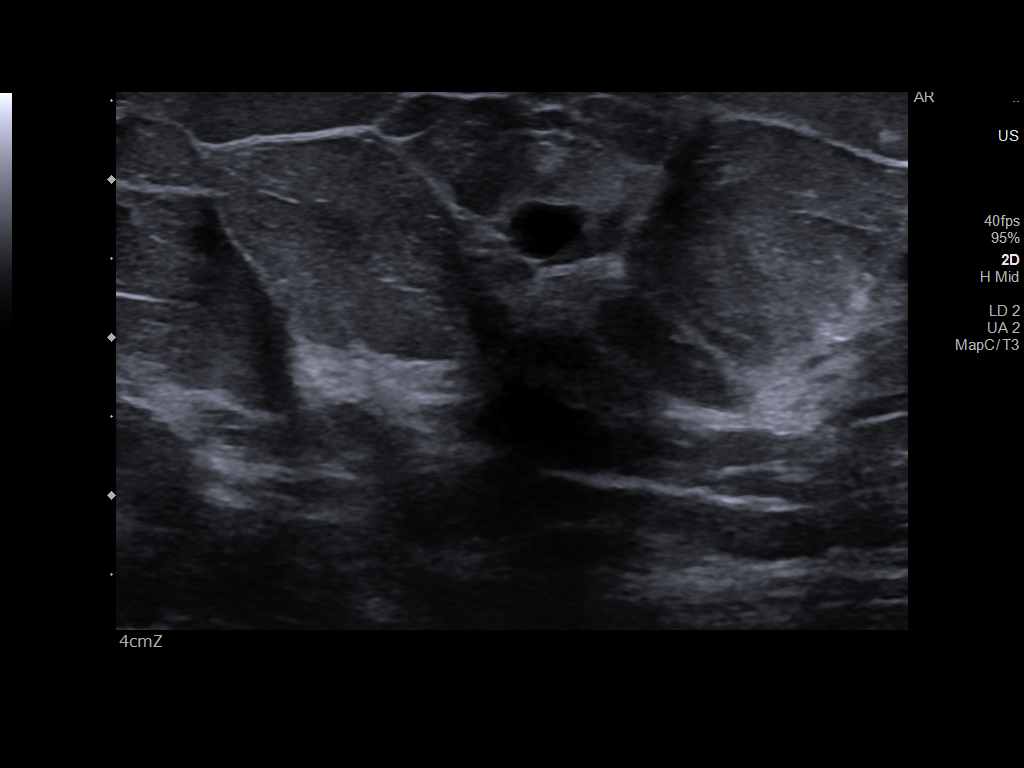
[im 4/6]
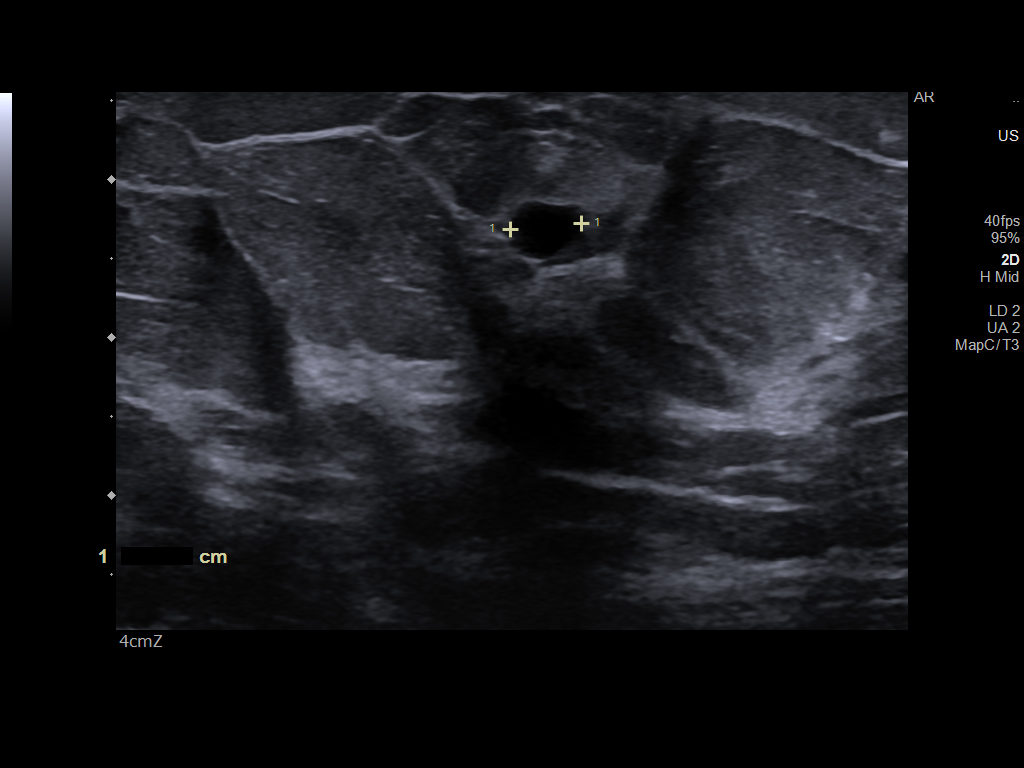
[im 5/6]
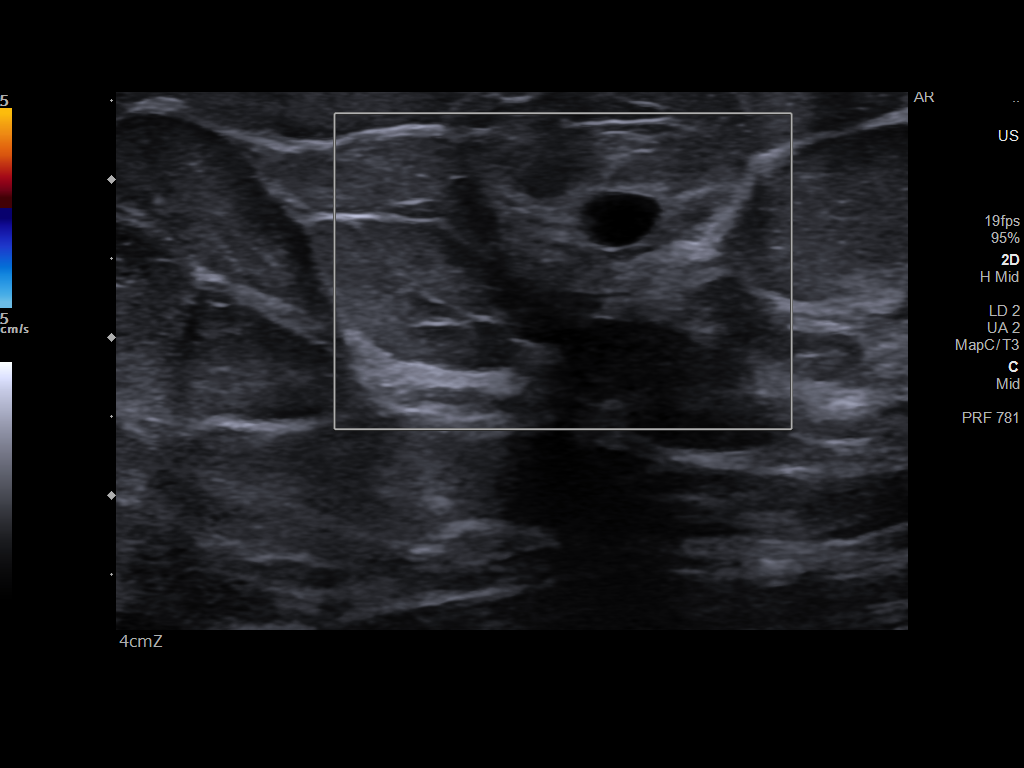
[im 6/6]
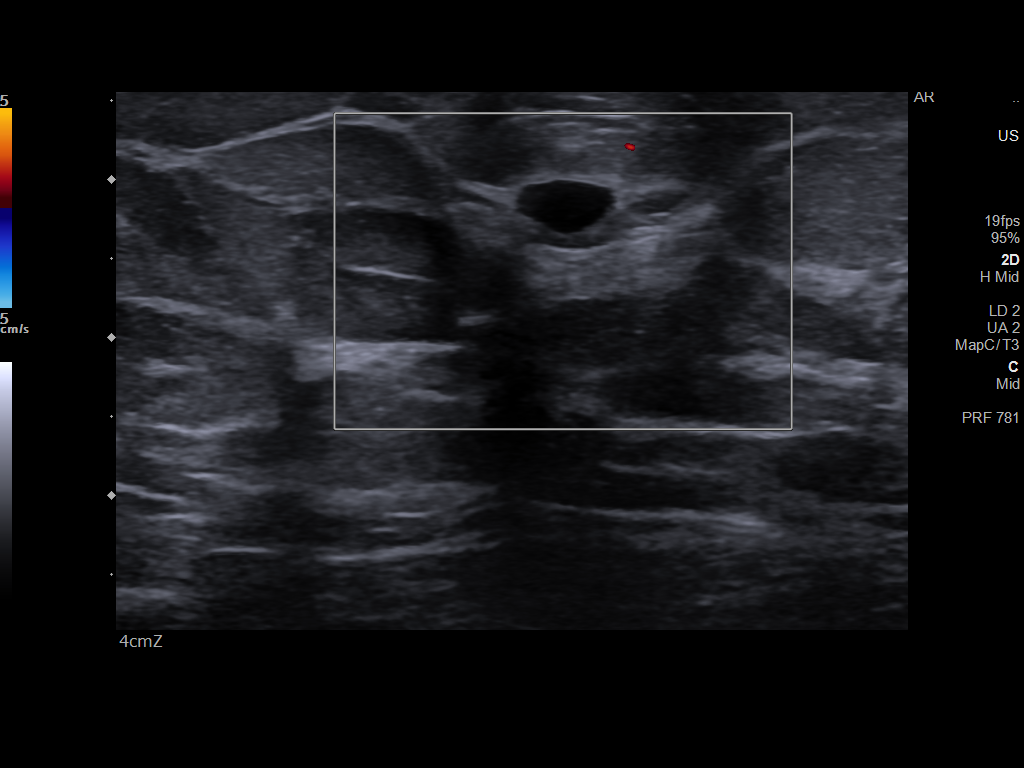

[6 of 6 positions shown; findings below may reference images not displayed]

ACR Breast Density Category c: The breast tissue is heterogeneously
dense, which may obscure small masses.
FINDINGS: No suspicious masses or calcifications are seen in the right breast.
There is an asymmetry within the inner left breast which appears to
persist on the additional spot compression views although
incompletely imaged.

Mammographic images were processed with CAD.

Targeted ultrasound of the entire inner as well as upper left breast
was performed. There is a septated/debris-filled cyst in the left
breast at the [DATE] position 6 cm from nipple measuring 0.5 x 0.3 x
0.5 cm. This may but not definitely be related to the asymmetry seen
in the inner left breast at mammography. No suspicious masses or
abnormality seen in the upper or inner left breast.
IMPRESSION: Probably benign left breast asymmetry/mass.

RECOMMENDATION:
Diagnostic mammography of the left breast with ultrasound in 6
months.

I have discussed the findings and recommendations with the patient.
If applicable, a reminder letter will be sent to the patient
regarding the next appointment.

BI-RADS CATEGORY  3: Probably benign.

## 2019-03-04 IMAGING — MG DIGITAL DIAGNOSTIC BILAT W/ TOMO W/ CAD
8 of 15 series · 8 of 40 positions shown · non-contrast
Comparison: None.

CLINICAL DATA: 30-year-old female with nonfocal right pain
throughout the right breast.

EXAM:
DIGITAL DIAGNOSTIC BILATERAL MAMMOGRAM WITH CAD AND TOMO
LEFT BREAST ULTRASOUND

[R CC synth-2D]
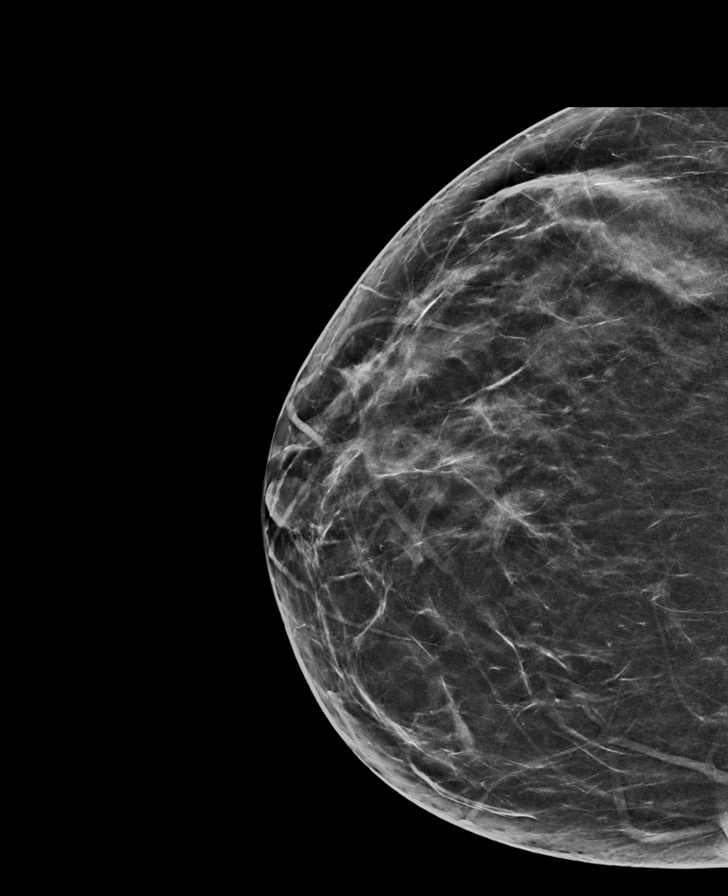

[R MLO synth-2D (1 of 2)]
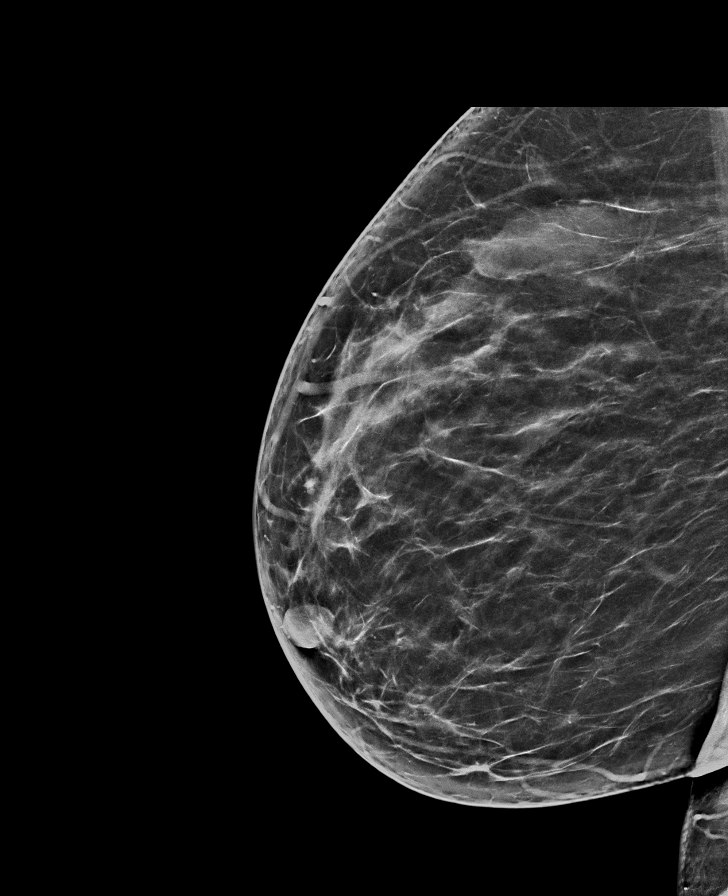

[L MLO synth-2D]
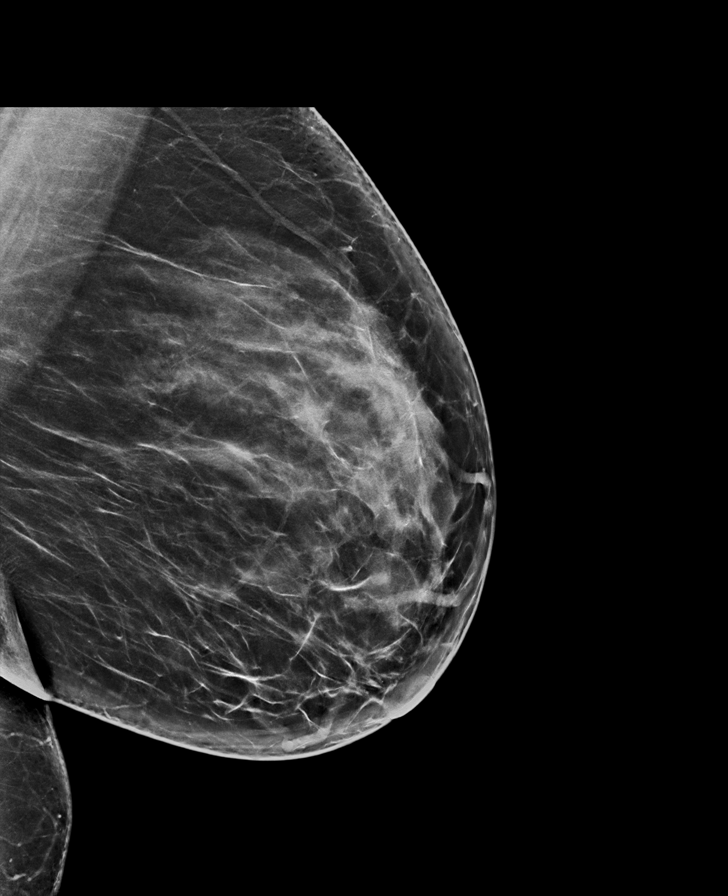

[L CC synth-2D (1 of 3)]
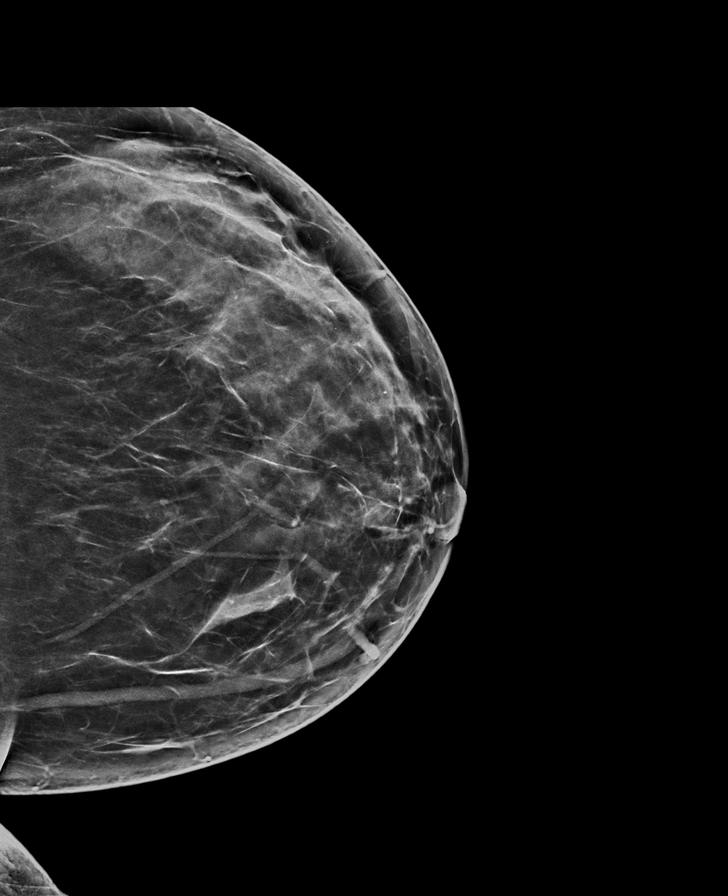

[R MLO synth-2D (2 of 2)]
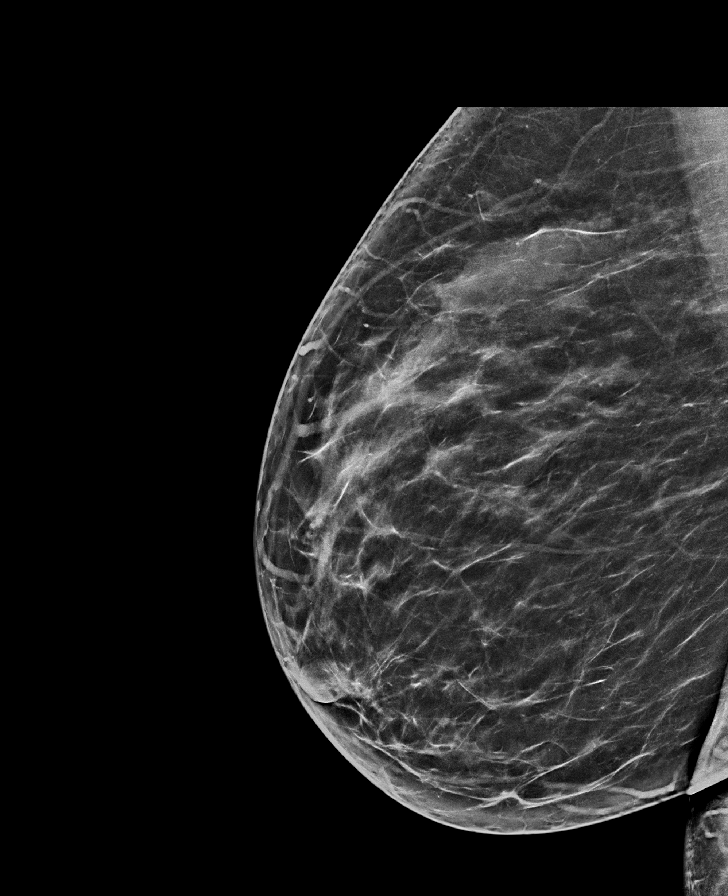

[L CC synth-2D (2 of 3)]
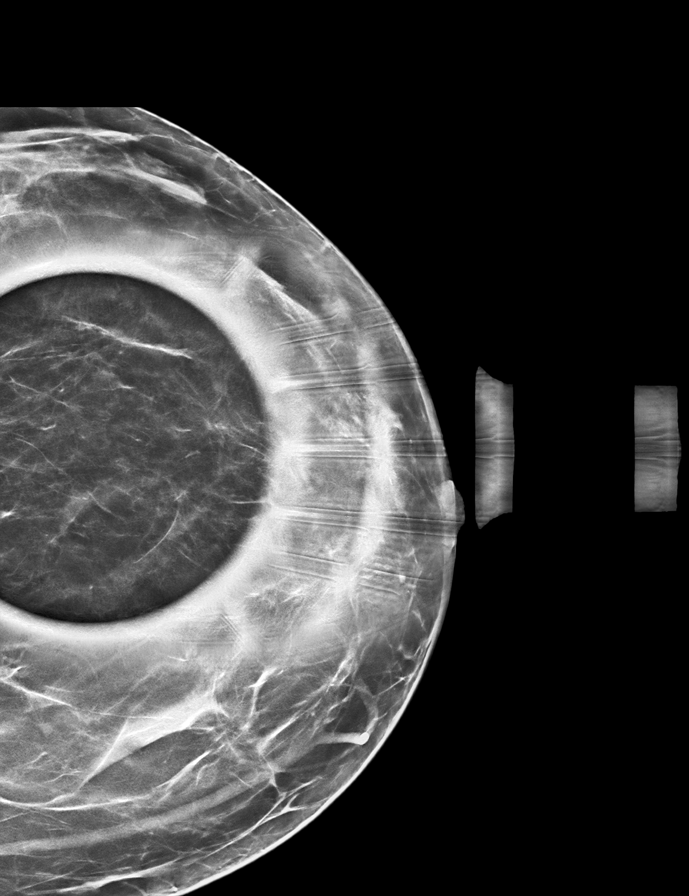

[L CC synth-2D (3 of 3)]
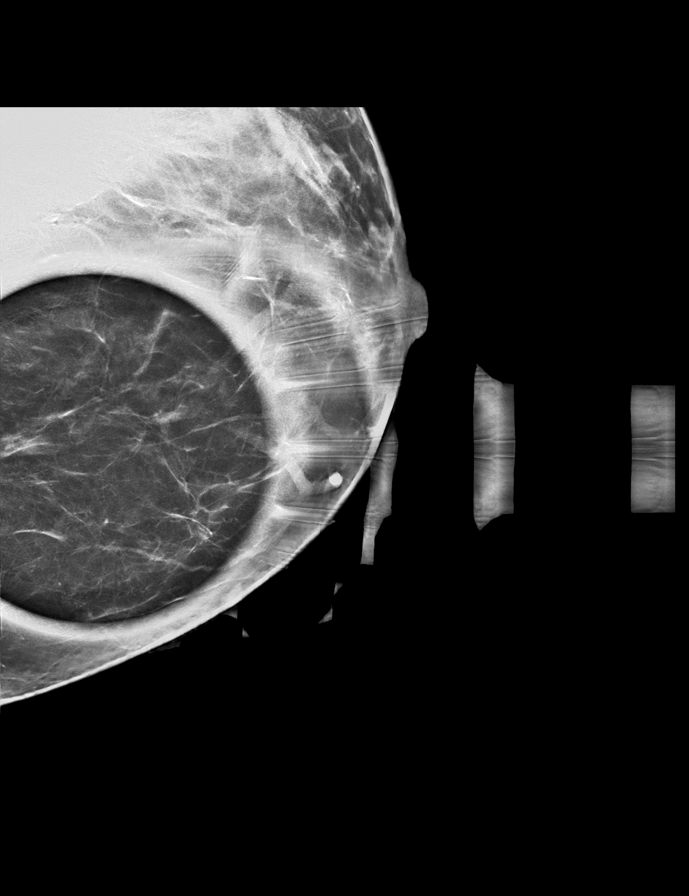

[L CC tomo · tomo slice 37/54.0]
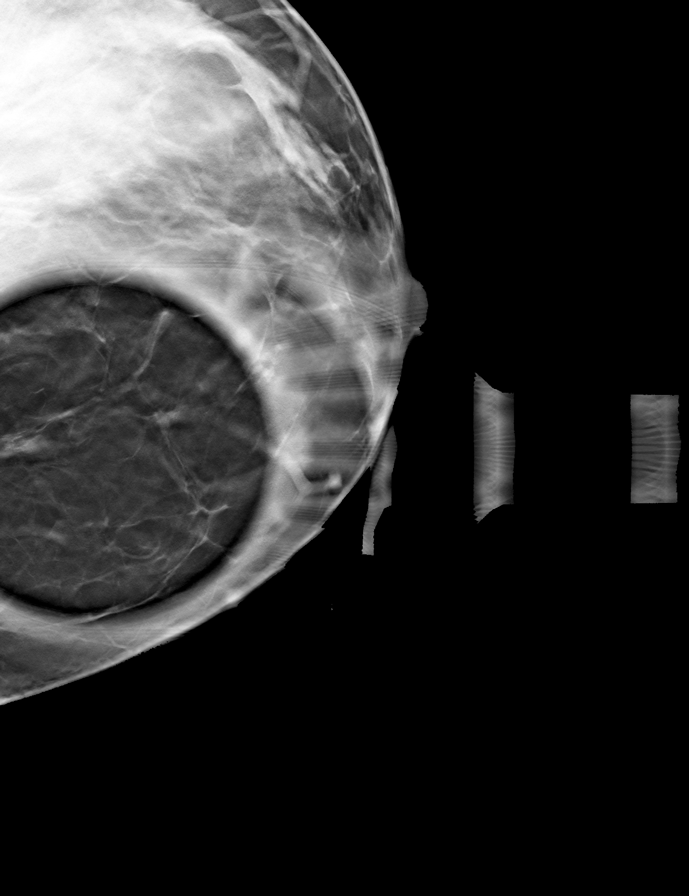

[8 of 40 positions shown; findings below may reference images not displayed]

ACR Breast Density Category c: The breast tissue is heterogeneously
dense, which may obscure small masses.
FINDINGS: No suspicious masses or calcifications are seen in the right breast.
There is an asymmetry within the inner left breast which appears to
persist on the additional spot compression views although
incompletely imaged.

Mammographic images were processed with CAD.

Targeted ultrasound of the entire inner as well as upper left breast
was performed. There is a septated/debris-filled cyst in the left
breast at the [DATE] position 6 cm from nipple measuring 0.5 x 0.3 x
0.5 cm. This may but not definitely be related to the asymmetry seen
in the inner left breast at mammography. No suspicious masses or
abnormality seen in the upper or inner left breast.
IMPRESSION: Probably benign left breast asymmetry/mass.

RECOMMENDATION:
Diagnostic mammography of the left breast with ultrasound in 6
months.

I have discussed the findings and recommendations with the patient.
If applicable, a reminder letter will be sent to the patient
regarding the next appointment.

BI-RADS CATEGORY  3: Probably benign.

## 2019-09-19 ENCOUNTER — Emergency Department (HOSPITAL_COMMUNITY): Payer: Medicaid Other

## 2019-09-19 ENCOUNTER — Other Ambulatory Visit: Payer: Self-pay

## 2019-09-19 ENCOUNTER — Encounter (HOSPITAL_COMMUNITY): Payer: Self-pay | Admitting: Emergency Medicine

## 2019-09-19 ENCOUNTER — Emergency Department (HOSPITAL_COMMUNITY)
Admission: EM | Admit: 2019-09-19 | Discharge: 2019-09-19 | Disposition: A | Discharge status: Home / Self Care | Payer: Medicaid Other | Attending: Emergency Medicine | Admitting: Emergency Medicine

## 2019-09-19 DIAGNOSIS — R319 Hematuria, unspecified: Secondary | ICD-10-CM | POA: Diagnosis not present

## 2019-09-19 DIAGNOSIS — N390 Urinary tract infection, site not specified: Secondary | ICD-10-CM | POA: Insufficient documentation

## 2019-09-19 DIAGNOSIS — K219 Gastro-esophageal reflux disease without esophagitis: Secondary | ICD-10-CM | POA: Insufficient documentation

## 2019-09-19 DIAGNOSIS — Z3202 Encounter for pregnancy test, result negative: Secondary | ICD-10-CM | POA: Diagnosis not present

## 2019-09-19 DIAGNOSIS — K869 Disease of pancreas, unspecified: Secondary | ICD-10-CM | POA: Diagnosis not present

## 2019-09-19 DIAGNOSIS — K8689 Other specified diseases of pancreas: Secondary | ICD-10-CM

## 2019-09-19 DIAGNOSIS — R109 Unspecified abdominal pain: Secondary | ICD-10-CM | POA: Diagnosis present

## 2019-09-19 HISTORY — DX: Calculus of kidney: N20.0

## 2019-09-19 LAB — COMPREHENSIVE METABOLIC PANEL
ALT: 18 U/L (ref 0–44)
AST: 12 U/L — ABNORMAL LOW (ref 15–41)
Albumin: 4.1 g/dL (ref 3.5–5.0)
Alkaline Phosphatase: 55 U/L (ref 38–126)
Anion gap: 6 (ref 5–15)
BUN: 11 mg/dL (ref 6–20)
CO2: 25 mmol/L (ref 22–32)
Calcium: 11 mg/dL — ABNORMAL HIGH (ref 8.9–10.3)
Chloride: 107 mmol/L (ref 98–111)
Creatinine, Ser: 0.81 mg/dL (ref 0.44–1.00)
GFR calc Af Amer: >60 mL/min (ref >60)
GFR calc non Af Amer: >60 mL/min (ref >60)
Glucose, Bld: 101 mg/dL — ABNORMAL HIGH (ref 70–99)
Potassium: 4.2 mmol/L (ref 3.5–5.1)
Sodium: 138 mmol/L (ref 135–145)
Total Bilirubin: 0.6 mg/dL (ref 0.3–1.2)
Total Protein: 7.4 g/dL (ref 6.5–8.1)

## 2019-09-19 LAB — URINALYSIS, ROUTINE W REFLEX MICROSCOPIC
Bilirubin Urine: NEGATIVE
Glucose, UA: NEGATIVE mg/dL
Ketones, ur: NEGATIVE mg/dL
Nitrite: POSITIVE — AB
Protein, ur: 100 mg/dL — AB
Specific Gravity, Urine: 1.01 (ref 1.005–1.030)
WBC, UA: >50 WBC/hpf — ABNORMAL HIGH (ref 0–5)
pH: 5 (ref 5.0–8.0)

## 2019-09-19 LAB — CBC WITH DIFFERENTIAL/PLATELET
Abs Immature Granulocytes: 0.02 10*3/uL (ref 0.00–0.07)
Basophils Absolute: 0 10*3/uL (ref 0.0–0.1)
Basophils Relative: 0 %
Eosinophils Absolute: 0 10*3/uL (ref 0.0–0.5)
Eosinophils Relative: 0 %
HCT: 43 % (ref 36.0–46.0)
Hemoglobin: 13.5 g/dL (ref 12.0–15.0)
Immature Granulocytes: 0 %
Lymphocytes Relative: 19 %
Lymphs Abs: 1.9 10*3/uL (ref 0.7–4.0)
MCH: 29.1 pg (ref 26.0–34.0)
MCHC: 31.4 g/dL (ref 30.0–36.0)
MCV: 92.7 fL (ref 80.0–100.0)
Monocytes Absolute: 0.7 10*3/uL (ref 0.1–1.0)
Monocytes Relative: 7 %
Neutro Abs: 7.3 10*3/uL (ref 1.7–7.7)
Neutrophils Relative %: 74 %
Platelets: 192 10*3/uL (ref 150–400)
RBC: 4.64 MIL/uL (ref 3.87–5.11)
RDW: 13 % (ref 11.5–15.5)
WBC: 9.9 10*3/uL (ref 4.0–10.5)
nRBC: 0 % (ref 0.0–0.2)

## 2019-09-19 LAB — POC URINE PREG, ED: Preg Test, Ur: NEGATIVE

## 2019-09-19 LAB — LIPASE, BLOOD: Lipase: 24 U/L (ref 11–51)

## 2019-09-19 IMAGING — CT CT RENAL STONE PROTOCOL
4 series · 13 of 46 positions shown, 18 images · non-contrast
Comparison: CT abdomen pelvis [DATE]

CLINICAL DATA: LEFT FLANK PAIN SINCE 12AM, WITH HEMATURIA WITH
NAUSEA. HX KIDNEY STONES. HX STENT PLACEMENT FOR STONES.

EXAM:
CT ABDOMEN AND PELVIS WITHOUT CONTRAST
TECHNIQUE: Multidetector CT imaging of the abdomen and pelvis was performed
following the standard protocol without IV contrast.

[Series 2: axial st · axial · 0.72mm/px · z∈[-648,-308]mm · 7 of 92 slices shown, 12 images]
[im 12/92  soft-tissue]
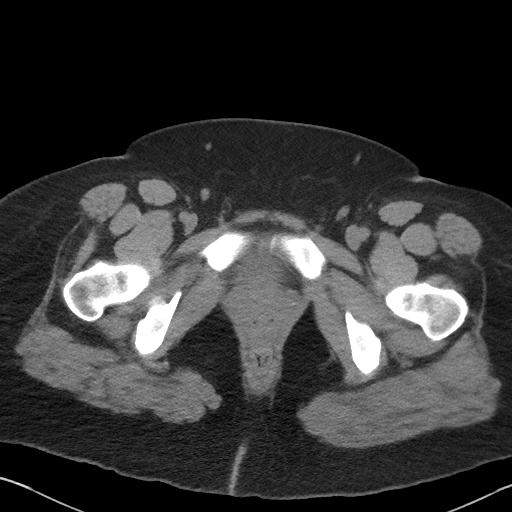
[im 12/92  bone]
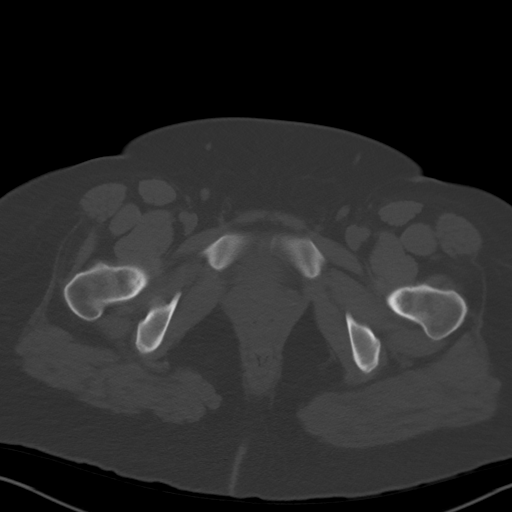
[im 23/92  soft-tissue]
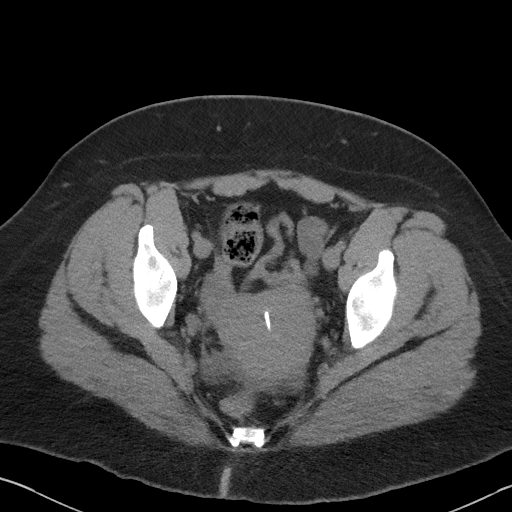
[im 35/92  soft-tissue]
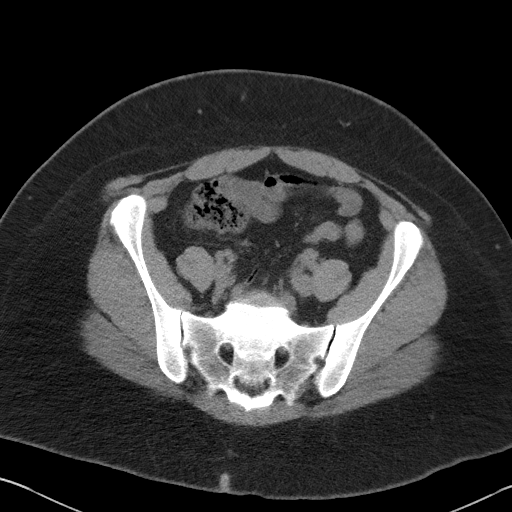
[im 46/92  soft-tissue]
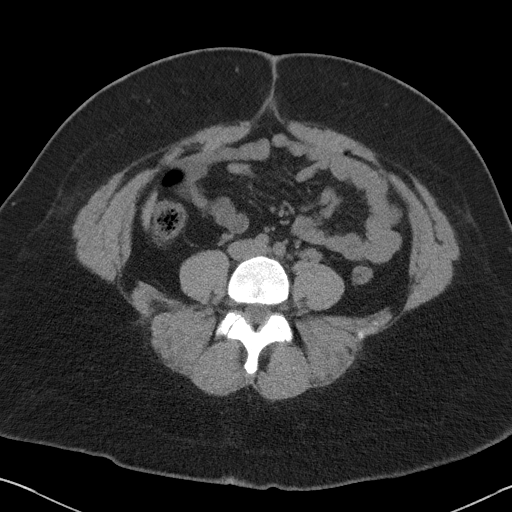
[im 46/92  lung]
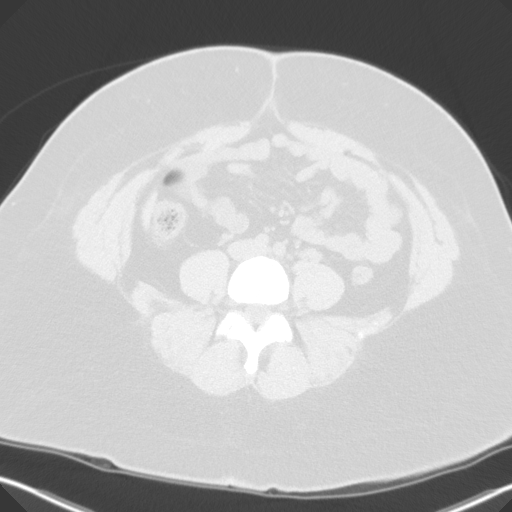
[im 57/92  soft-tissue]
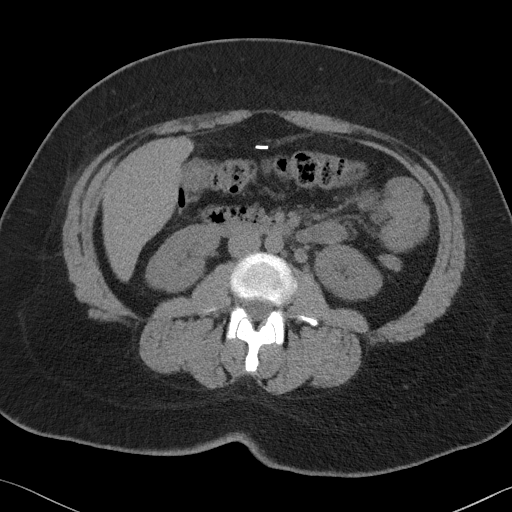
[im 57/92  lung]
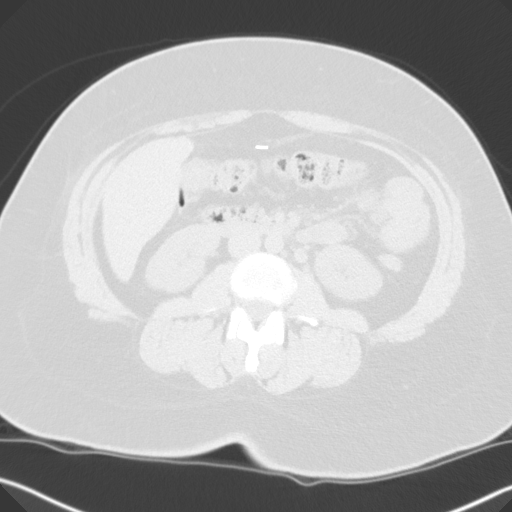
[im 69/92  soft-tissue]
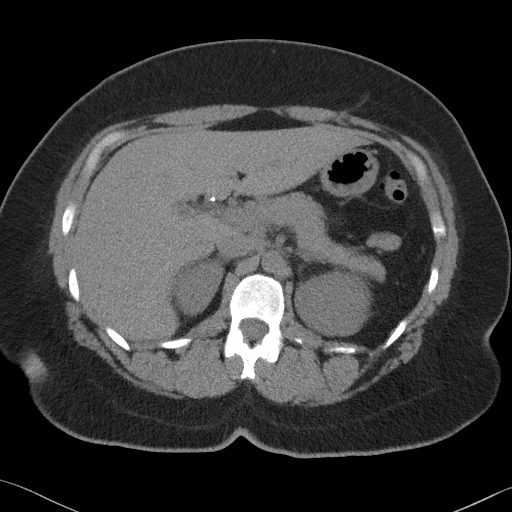
[im 69/92  lung]
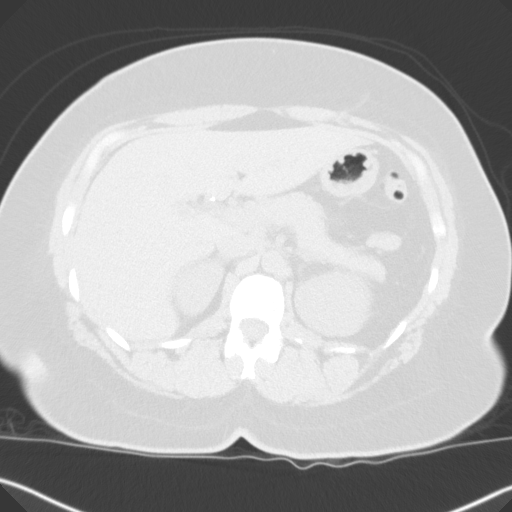
[im 80/92  soft-tissue]
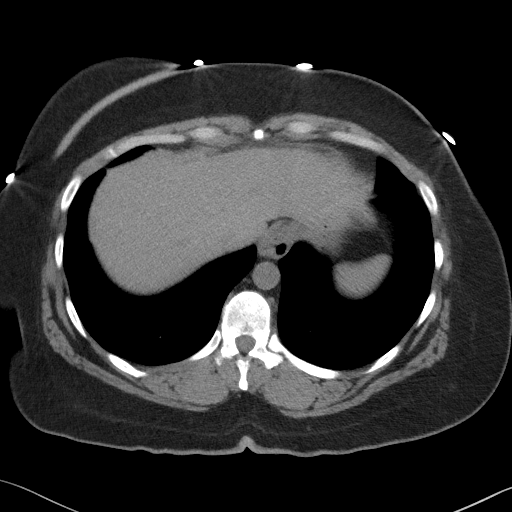
[im 80/92  lung]
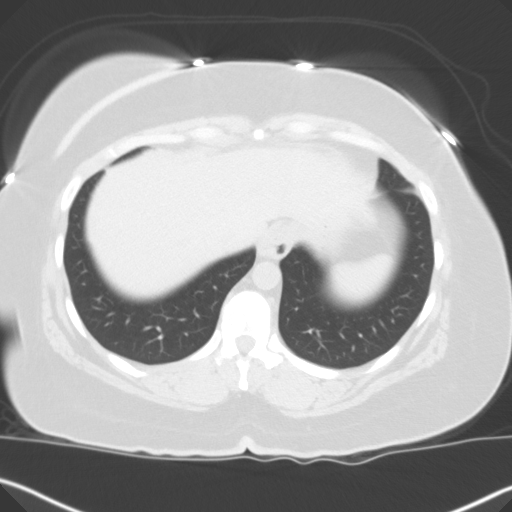

[Series 4: lung bases · axial · 0.72mm/px · z∈[-666,-624]mm · 2 of 230 slices shown]
[im 21/230  bone]
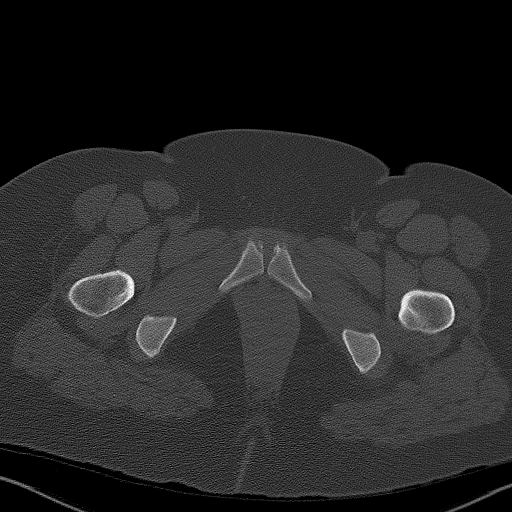
[im 42/230  bone]
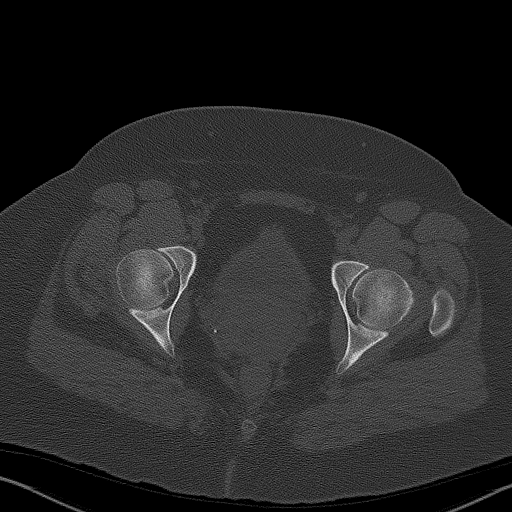

[Series 5: coronal st · coronal · 0.77mm/px · 3 of 101 slices shown]
[im 34/101  soft-tissue]
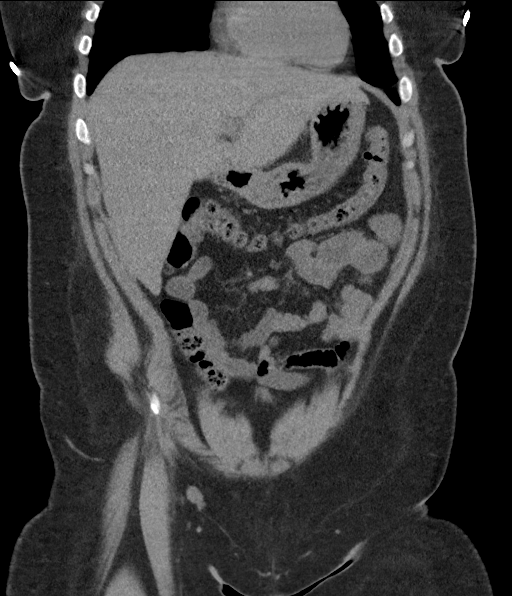
[im 45/101  soft-tissue]
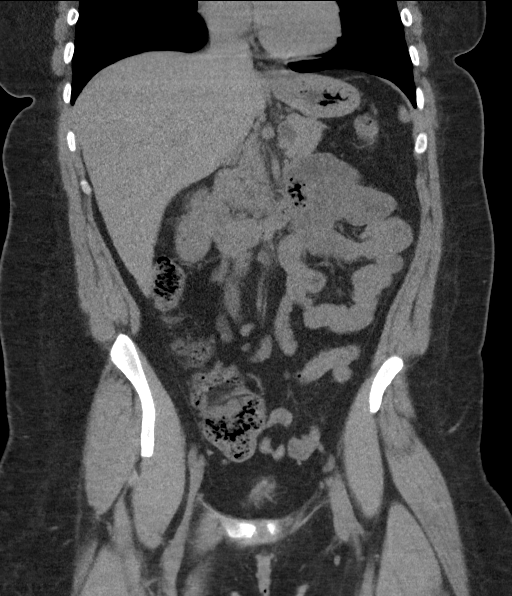
[im 56/101  soft-tissue]
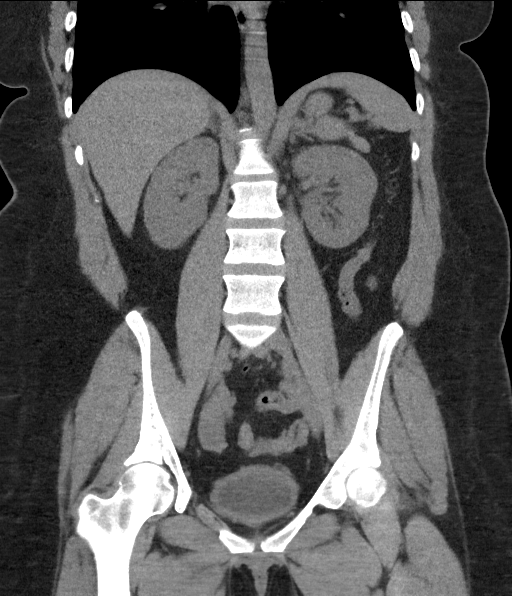

[Series 6: sagittal st · sagittal · 0.69mm/px · 1 of 101 slices shown]
[im 34/101  soft-tissue]
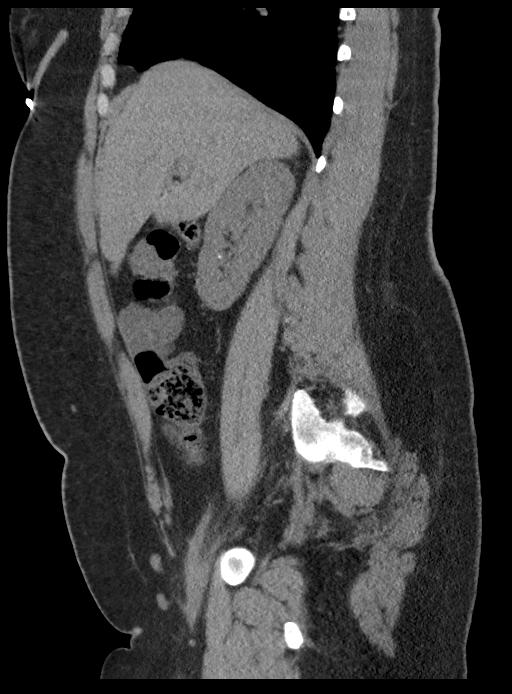

[13 of 46 positions shown; findings below may reference images not displayed]

FINDINGS: Lower chest: No acute abnormality.

Evaluation of the abdominal viscera somewhat limited by the lack of
IV contrast.

Hepatobiliary: No focal liver abnormality identified. Status post
cholecystectomy.

Pancreas: There is a cyst in the pancreatic body measuring 1.6 x
cm. There is no peripancreatic inflammatory change.

Spleen: Normal in size without focal abnormality.

Adrenals/Urinary Tract: Adrenal glands are normal in appearance.
There is a punctate renal calculus in each kidney. No
hydronephrosis. No large renal mass. The urinary bladder wall is
thickened.

Stomach/Bowel: Stomach is within normal limits. Appendix appears
normal. No evidence of bowel wall thickening, distention, or
inflammatory changes.

Vascular/Lymphatic: No significant vascular findings are present. No
enlarged abdominal or pelvic lymph nodes.

Reproductive: Uterus and bilateral adnexa are unremarkable. IUD in
place.

Other: No abdominopelvic ascites.

Musculoskeletal: No acute or significant osseous findings.
IMPRESSION: 1. The urinary bladder wall is thickened, which can be seen with
cystitis. Recommend correlation with urinalysis.
2. There is a 1.6 x 1.3 cm cyst in the pancreatic body. Recommend
further evaluation with pancreatic protocol MRI on a nonemergent
basis.
3. Punctate nonobstructing renal calculus in each kidney.

## 2019-09-19 MED ORDER — HYDROCODONE-ACETAMINOPHEN 5-325 MG PO TABS: 1.0000 | ORAL_TABLET | ORAL | 0 refills | Status: DC | PRN

## 2019-09-19 MED ORDER — HYDROCODONE-ACETAMINOPHEN 5-325 MG PO TABS: 1.0000 | ORAL_TABLET | Freq: Four times a day (QID) | ORAL | 0 refills | Status: DC | PRN

## 2019-09-19 MED ORDER — HYDROMORPHONE HCL 1 MG/ML IJ SOLN
1.0000 mg | Freq: Once | INTRAMUSCULAR | Status: AC
  Administered 2019-09-19: 1 mg via INTRAMUSCULAR
  Filled 2019-09-19: qty 1

## 2019-09-19 MED ORDER — ONDANSETRON HCL 4 MG/2ML IJ SOLN
4.0000 mg | Freq: Once | INTRAMUSCULAR | Status: AC
  Administered 2019-09-19: 4 mg via INTRAMUSCULAR
  Filled 2019-09-19: qty 2

## 2019-09-19 MED ORDER — CEPHALEXIN 500 MG PO CAPS: 500.0000 mg | ORAL_CAPSULE | Freq: Four times a day (QID) | ORAL | 0 refills | Status: DC

## 2019-09-19 MED ORDER — CEPHALEXIN 500 MG PO CAPS: 500.0000 mg | ORAL_CAPSULE | Freq: Four times a day (QID) | ORAL | 0 refills | Status: AC

## 2019-09-19 NOTE — ED Provider Notes (Signed)
[facility] EMERGENCY DEPARTMENT Provider Note   CSN: [id number] Arrival date & time: 09/19/19  4166     History No chief complaint on file.   Brandi Dunn is a 39 y.o. female.  Pt complains of severe flank pain.  Pt reports pain radiates around to her abdomen   The history is provided by the patient. No language interpreter was used.       Past Medical History:  Diagnosis Date  . GERD (gastroesophageal reflux disease)   . H/O renal calculi   . Kidney stones   . Migraine    last migraine 03/24/11  . 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 Female pelvic pain 04/06/2016  . Vitamin D deficiency 08/23/2015  . GERD (gastroesophageal reflux disease) 08/18/2015  . Migraine 08/18/2015  . Encounter for IUD insertion 03/10/2015    Past Surgical History:  Procedure Laterality Date  . CHOLECYSTECTOMY  04/12/2011   Procedure: LAPAROSCOPIC CHOLECYSTECTOMY;  Surgeon: Jamesetta So, MD;  Location: [facility];  Service: General;  Laterality: N/A;  . CYSTOSCOPY W/ URETERAL STENT PLACEMENT Left 09/10/2012   Procedure: CYSTOSCOPY WITH LEFT RETROGRADE PYELOGRAM; LEFT URETERAL STENT PLACEMENT;  Surgeon: Marissa Nestle, MD;  Location: [facility];  Service: Urology;  Laterality: Left;  . DILATION AND CURETTAGE OF UTERUS  2000?   x2, [facility]  . STONE EXTRACTION WITH BASKET Left 09/10/2012   Procedure: BALLOON DILATATION LEFT URETER; LEFT URETEROSCOPIC STONE EXTRACTION WITH BASKET;  Surgeon: Marissa Nestle, MD;  Location: [facility];  Service: Urology;  Laterality: Left;     OB History    Gravida  4   Para  2   Term      Preterm      AB  2   Living        SAB  2   TAB      Ectopic      Multiple      Live Births              Family History  Problem Relation Age of Onset  . Hypertension Mother   . Hyperlipidemia Mother   . Hypertension Father   . Anesthesia problems Neg Hx   . Hypotension Neg Hx   . Malignant  hyperthermia Neg Hx   . Pseudochol deficiency Neg Hx     Social History   Tobacco Use  . Smoking status: Never Smoker  . Smokeless tobacco: Never Used  Vaping Use  . Vaping Use: Never used  Substance Use Topics  . Alcohol use: No    Alcohol/week: 0.0 standard drinks  . Drug use: No    Home Medications Prior to Admission medications   Medication Sig Start Date End Date Taking? Authorizing Provider  cephALEXin (KEFLEX) 500 MG capsule Take 1 capsule (500 mg total) by mouth 4 (four) times daily for 10 days. 09/19/19 09/29/19  Milton Ferguson, MD  HYDROcodone-acetaminophen (NORCO/VICODIN) 5-325 MG tablet Take 1 tablet by mouth every 4 (four) hours as needed. 09/19/19   Fransico Meadow, PA-C  HYDROcodone-acetaminophen (NORCO/VICODIN) 5-325 MG tablet Take 1 tablet by mouth every 6 (six) hours as needed for moderate pain. 09/19/19   Milton Ferguson, MD  levonorgestrel (MIRENA) 20 MCG/24HR IUD 1 each by Intrauterine route once.    [provider]  pantoprazole (PROTONIX) 40 MG tablet Take 1 tablet (40 mg total) by mouth daily. Prn acid reflux 08/18/15   Pearson Forster  C, NP  rizatriptan (MAXALT) 10 MG tablet Take 1 tablet (10 mg total) by mouth as needed for migraine. May repeat in 2 hours if needed; max 2 per 24 hrs 08/18/15   Nilda Simmer, NP  sertraline (ZOLOFT) 50 MG tablet Take 1 tablet (50 mg total) by mouth daily. 02/13/19   Kathyrn Drown, MD    Allergies    Patient has no known allergies.  Review of Systems   Review of Systems  All other systems reviewed and are negative.   Physical Exam Updated Vital Signs BP (!) 117/56 (BP Location: Right Arm)   Pulse 92   Temp 97.9 F (36.6 C) (Oral)   Resp 18   Ht 5\' 4"  (1.626 m)   Wt 106.1 kg   LMP 09/08/2019   SpO2 97%   BMI 40.17 kg/m   Physical Exam Vitals and nursing note reviewed.  Constitutional:      Appearance: She is well-developed.  HENT:     Head: Normocephalic.     Nose: Nose normal.     Mouth/Throat:      Mouth: Mucous membranes are moist.  Cardiovascular:     Rate and Rhythm: Normal rate.     Pulses: Normal pulses.  Pulmonary:     Effort: Pulmonary effort is normal.  Abdominal:     General: There is no distension.  Musculoskeletal:        General: Normal range of motion.     Cervical back: Normal range of motion.  Skin:    General: Skin is warm.  Neurological:     Mental Status: She is alert and oriented to person, place, and time.  Psychiatric:        Mood and Affect: Mood normal.     ED Results / Procedures / Treatments   Labs (all labs ordered are listed, but only abnormal results are displayed) Labs Reviewed  URINALYSIS, ROUTINE W REFLEX MICROSCOPIC - Abnormal; Notable for the following components:      Result Value   Color, Urine AMBER (*)    APPearance CLOUDY (*)    Hgb urine dipstick LARGE (*)    Protein, ur 100 (*)    Nitrite POSITIVE (*)    Leukocytes,Ua LARGE (*)    WBC, UA >50 (*)    Bacteria, UA RARE (*)    All other components within normal limits  COMPREHENSIVE METABOLIC PANEL - Abnormal; Notable for the following components:   Glucose, Bld 101 (*)    Calcium 11.0 (*)    AST 12 (*)    All other components within normal limits  CBC WITH DIFFERENTIAL/PLATELET  LIPASE, BLOOD  POC URINE PREG, ED    EKG None  Radiology CT Renal Stone Study  Result Date: 09/19/2019 CLINICAL DATA:  LEFT FLANK PAIN SINCE 12AM, WITH HEMATURIA WITH NAUSEA. HX KIDNEY STONES. HX STENT PLACEMENT FOR STONES. EXAM: CT ABDOMEN AND PELVIS WITHOUT CONTRAST TECHNIQUE: Multidetector CT imaging of the abdomen and pelvis was performed following the standard protocol without IV contrast. COMPARISON:  CT abdomen pelvis 09/05/2012 FINDINGS: Lower chest: No acute abnormality. Evaluation of the abdominal viscera somewhat limited by the lack of IV contrast. Hepatobiliary: No focal liver abnormality identified. Status post cholecystectomy. Pancreas: There is a cyst in the pancreatic body  measuring 1.6 x 1.3 cm. There is no peripancreatic inflammatory change. Spleen: Normal in size without focal abnormality. Adrenals/Urinary Tract: Adrenal glands are normal in appearance. There is a punctate renal calculus in each kidney. No hydronephrosis. No  large renal mass. The urinary bladder wall is thickened. Stomach/Bowel: Stomach is within normal limits. Appendix appears normal. No evidence of bowel wall thickening, distention, or inflammatory changes. Vascular/Lymphatic: No significant vascular findings are present. No enlarged abdominal or pelvic lymph nodes. Reproductive: Uterus and bilateral adnexa are unremarkable. IUD in place. Other: No abdominopelvic ascites. Musculoskeletal: No acute or significant osseous findings. IMPRESSION: 1. The urinary bladder wall is thickened, which can be seen with cystitis. Recommend correlation with urinalysis. 2. There is a 1.6 x 1.3 cm cyst in the pancreatic body. Recommend further evaluation with pancreatic protocol MRI on a nonemergent basis. 3. Punctate nonobstructing renal calculus in each kidney. Electronically Signed   By: Audie Pinto M.D.   On: 09/19/2019 11:56    Procedures Procedures (including critical care time)  Medications Ordered in ED Medications  HYDROmorphone (DILAUDID) injection 1 mg (1 mg Intramuscular Given 09/19/19 1143)  ondansetron (ZOFRAN) injection 4 mg (4 mg Intramuscular Given 09/19/19 1142)    ED Course  I have reviewed the triage vital signs and the nursing notes.  Pertinent labs & imaging results that were available during my care of the patient were reviewed by me and considered in my medical decision making (see chart for details).    MDM Rules/Calculators/A&P                          MDM: Ct scan shows a pancreatic mass .  Pt advised to see Dr. Wolfgang Phoenix to have MRi scheduled  Pt advised to follow up with her MD for recheck.  Pt given rx for keflex and hydrocodone.  Final Clinical Impression(s) / ED  Diagnoses Final diagnoses:  Urinary tract infection with hematuria, site unspecified  Pancreatic mass    Rx / DC Orders ED Discharge Orders         Ordered    cephALEXin (KEFLEX) 500 MG capsule  4 times daily,   Status:  Discontinued     Reprint     09/19/19 1333    HYDROcodone-acetaminophen (NORCO/VICODIN) 5-325 MG tablet  Every 4 hours PRN     Discontinue  Reprint     09/19/19 1333    cephALEXin (KEFLEX) 500 MG capsule  4 times daily     Discontinue  Reprint     09/19/19 1405    HYDROcodone-acetaminophen (NORCO/VICODIN) 5-325 MG tablet  Every 6 hours PRN,   Status:  Discontinued     Reprint     09/19/19 1405    HYDROcodone-acetaminophen (NORCO/VICODIN) 5-325 MG tablet  Every 6 hours PRN     Discontinue  Reprint     09/19/19 1407        An After Visit Summary was printed and given to the patient.    Sidney Ace 09/20/19 1215    Milton Ferguson, MD 09/21/19 1245

## 2019-09-19 NOTE — Discharge Instructions (Signed)
Take antibiotics as directed. Your ct scan shows a mass on your pancreas which is most likely a cyst.  Our radiologist has advised that you should have an MRi scheduled by your primary MD to evaluate this area further.

## 2019-09-19 NOTE — ED Triage Notes (Signed)
Left flank pain.  States it feels like kidney stone pain.  2 days ago states she felt tingling when she peed and she took AZO over the counter.

## 2019-09-28 ENCOUNTER — Telehealth: Payer: Self-pay | Admitting: Family Medicine

## 2019-09-28 NOTE — Telephone Encounter (Signed)
Nurses Patient recently in the hospital ER for evaluation Patient has a area on her pancreas that needs follow-up Recommend a follow-up office visit within the next 3 weeks.

## 2019-09-29 NOTE — Telephone Encounter (Signed)
Please contact pt to schedule ER follow up in the next 3 weeks with Dr. Nicki Reaper.

## 2019-10-07 ENCOUNTER — Other Ambulatory Visit (HOSPITAL_COMMUNITY): Payer: Self-pay | Admitting: Family Medicine

## 2019-10-07 DIAGNOSIS — N6002 Solitary cyst of left breast: Secondary | ICD-10-CM

## 2019-10-21 ENCOUNTER — Ambulatory Visit (INDEPENDENT_AMBULATORY_CARE_PROVIDER_SITE_OTHER): Payer: Medicaid Other | Admitting: Family Medicine

## 2019-10-21 ENCOUNTER — Encounter: Payer: Self-pay | Admitting: Family Medicine

## 2019-10-21 ENCOUNTER — Other Ambulatory Visit: Payer: Self-pay

## 2019-10-21 VITALS — BP 118/72 | HR 83 | Temp 96.9°F | Wt 229.8 lb

## 2019-10-21 DIAGNOSIS — G4700 Insomnia, unspecified: Secondary | ICD-10-CM

## 2019-10-21 DIAGNOSIS — K862 Cyst of pancreas: Secondary | ICD-10-CM

## 2019-10-21 MED ORDER — LORAZEPAM 1 MG PO TABS: ORAL_TABLET | ORAL | 1 refills | Status: DC

## 2019-10-21 NOTE — Progress Notes (Signed)
   Subjective:    Patient ID: Brandi Dunn, female    DOB: 09-02-1980, 39 y.o.   MRN: [id number]  HPI Pt here for follow up. Pt was seen at [facility] ED on 09/19/19. Pancreatic mass and kidney stones were found.  Pt also had kidney stones. Patient denies flank pain discomfort.  Denies abdominal pain nausea vomiting diarrhea fever chills denies any history of any pancreatic problems Pt would also like to discuss sleep aid. Pt having trouble falling asleep. A while back pt was on Ambien.  Under a lot of stress her mother recently had surgery had some complic ations has a colostomy as well as dialysis Patient denies being depressed Review of Systems Please see above    Objective:   Physical Exam  Lungs clear respiratory rate normal heart regular no murmurs extremities no edema abdomen soft no guarding or rebound      Assessment & Plan:  Pancreatic cyst Needs pancreatic MRI to help delineate if this is more worrisome or not Hypercalcemia need to have follow-up lab work to rule out possibility of hyperparathyroidism Insomnia May utilize Ativan at nighttime to help with sleep Warning signs regarding all the above were discussed.

## 2019-10-22 LAB — PTH, INTACT AND CALCIUM: PTH: 119 pg/mL — ABNORMAL HIGH (ref 15–65)

## 2019-10-22 LAB — BASIC METABOLIC PANEL
BUN/Creatinine Ratio: 13 (ref 9–23)
BUN: 9 mg/dL (ref 6–20)
CO2: 22 mmol/L (ref 20–29)
Calcium: 11.2 mg/dL — ABNORMAL HIGH (ref 8.7–10.2)
Chloride: 104 mmol/L (ref 96–106)
Creatinine, Ser: 0.71 mg/dL (ref 0.57–1.00)
GFR calc Af Amer: 124 mL/min/{1.73_m2} (ref >59)
GFR calc non Af Amer: 108 mL/min/{1.73_m2} (ref >59)
Glucose: 89 mg/dL (ref 65–99)
Potassium: 4.5 mmol/L (ref 3.5–5.2)
Sodium: 138 mmol/L (ref 134–144)

## 2019-10-22 LAB — VITAMIN D 25 HYDROXY (VIT D DEFICIENCY, FRACTURES): Vit D, 25-Hydroxy: 9.6 ng/mL — ABNORMAL LOW (ref 30.0–100.0)

## 2019-10-23 ENCOUNTER — Other Ambulatory Visit: Payer: Self-pay | Admitting: *Deleted

## 2019-10-23 DIAGNOSIS — E213 Hyperparathyroidism, unspecified: Secondary | ICD-10-CM

## 2019-10-28 ENCOUNTER — Encounter (HOSPITAL_COMMUNITY): Payer: Medicaid Other

## 2019-10-28 ENCOUNTER — Ambulatory Visit (HOSPITAL_COMMUNITY): Payer: Medicaid Other

## 2019-10-28 ENCOUNTER — Other Ambulatory Visit (HOSPITAL_COMMUNITY): Payer: Medicaid Other

## 2019-10-31 ENCOUNTER — Telehealth: Payer: Self-pay | Admitting: Family Medicine

## 2019-10-31 NOTE — Telephone Encounter (Signed)
Pt having abdominal MRI on 11/12/2019, pt states she's claustrophobic & would like something called in to calm her before the scan, states she's used valium in the past with having a scan  Please advise & call pt when done    [facility]

## 2019-11-01 NOTE — Telephone Encounter (Signed)
Please pend Valium 5 mg, take 1 tablet 1 hour before procedure.  Do not drive with medicine.  Number 2 tablets no refills caution drowsiness

## 2019-11-04 ENCOUNTER — Telehealth: Payer: Self-pay

## 2019-11-04 NOTE — Telephone Encounter (Signed)
No , I need to see her first.

## 2019-11-04 NOTE — Telephone Encounter (Signed)
Dr Dorris Fetch, Can this pt see [facility] for Hyperparathyroidism [facility])?

## 2019-11-05 ENCOUNTER — Other Ambulatory Visit: Payer: Self-pay

## 2019-11-05 ENCOUNTER — Ambulatory Visit (INDEPENDENT_AMBULATORY_CARE_PROVIDER_SITE_OTHER): Payer: Medicaid Other | Admitting: "Endocrinology

## 2019-11-05 ENCOUNTER — Encounter: Payer: Self-pay | Admitting: "Endocrinology

## 2019-11-05 VITALS — BP 114/72 | HR 88 | Ht 63.0 in | Wt 230.8 lb

## 2019-11-05 DIAGNOSIS — E212 Other hyperparathyroidism: Secondary | ICD-10-CM

## 2019-11-05 DIAGNOSIS — E559 Vitamin D deficiency, unspecified: Secondary | ICD-10-CM

## 2019-11-05 DIAGNOSIS — E210 Primary hyperparathyroidism: Secondary | ICD-10-CM | POA: Insufficient documentation

## 2019-11-05 MED ORDER — VITAMIN D (ERGOCALCIFEROL) 1.25 MG (50000 UNIT) PO CAPS
50000.0000 [IU] | ORAL_CAPSULE | ORAL | 0 refills | Status: DC
Start: 2019-11-05 — End: 2020-01-28

## 2019-11-05 NOTE — Progress Notes (Signed)
Endocrinology Consult Note       11/05/2019, 1:09 PM  Brandi Dunn is a 39 y.o.-year-old female, referred by her  Kathyrn Drown, MD  , for evaluation for hypercalcemia/hyperparathyroidism.   Past Medical History:  Diagnosis Date  . GERD (gastroesophageal reflux disease)   . H/O renal calculi   . Kidney stones   . Migraine    last migraine 03/24/11  . 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 CHOLECYSTECTOMY  04/12/2011   Procedure: LAPAROSCOPIC CHOLECYSTECTOMY;  Surgeon: Jamesetta So, MD;  Location: [facility];  Service: General;  Laterality: N/A;  . CYSTOSCOPY W/ URETERAL STENT PLACEMENT Left 09/10/2012   Procedure: CYSTOSCOPY WITH LEFT RETROGRADE PYELOGRAM; LEFT URETERAL STENT PLACEMENT;  Surgeon: Marissa Nestle, MD;  Location: [facility];  Service: Urology;  Laterality: Left;  . DILATION AND CURETTAGE OF UTERUS  2000?   x2, [facility]  . STONE EXTRACTION WITH BASKET Left 09/10/2012   Procedure: BALLOON DILATATION LEFT URETER; LEFT URETEROSCOPIC STONE EXTRACTION WITH BASKET;  Surgeon: Marissa Nestle, MD;  Location: [facility];  Service: Urology;  Laterality: Left;    Social History   Tobacco Use  . Smoking status: Never Smoker  . Smokeless tobacco: Never Used  Vaping Use  . Vaping Use: Never used  Substance Use Topics  . Alcohol use: No    Alcohol/week: 0.0 standard drinks  . Drug use: No    Family History  Problem Relation Age of Onset  . Hypertension Mother   . Hyperlipidemia Mother   . Hypertension Father   . Anesthesia problems Neg Hx   . Hypotension Neg Hx   . Malignant hyperthermia Neg Hx   . Pseudochol deficiency Neg Hx     Outpatient Encounter Medications as of 11/05/2019  Medication Sig  . levonorgestrel (MIRENA) 20 MCG/24HR IUD 1 each by Intrauterine route once.  Marland Kitchen LORazepam (ATIVAN) 1 MG tablet 1/2 to 1 qhs prn insomnia  . Vitamin D, Ergocalciferol,  (DRISDOL) 1.25 MG (50000 UNIT) CAPS capsule Take 1 capsule (50,000 Units total) by mouth every 7 (seven) days.   No facility-administered encounter medications on file as of 11/05/2019.    No Known Allergies   HPI  Brandi Dunn was diagnosed with hypercalcemia in 2011.  History obtained directly from the patient patient reports history of recurrent nephrolithiasis which required surgery on 1 occasion.   She denies any prior history of thyroid, pituitary, adrenal dysfunction.  -Review of herreferral package of most recent labs reveals calcium of 11.2 the corresponding PTH of 119 on October 21, 2019. -She does not have DEXA to review.  No prior history of fragility fractures or falls.  No history of CKD. Last BUN/Cr: 9/0.71  she is not on HCTZ or other thiazide therapy.  She has a history of profound vitamin D deficiency, currently not on supplement.  Vitamin D panel in February 2022 was such that 25-hydroxy vitamin D was 28, 1, 25 dihydroxy vitamin D was 34.   she is not on calcium supplements,  she eats dairy and green, leafy, vegetables on average amounts.  she does  not have a family history of hypercalcemia, pituitary tumors, thyroid cancer, or osteoporosis.     ROS:  Constitutional: no weight gain/loss, no fatigue, no subjective hyperthermia, no subjective hypothermia Eyes: no blurry vision, no xerophthalmia ENT: no sore throat, no nodules palpated in throat, no dysphagia/odynophagia, no hoarseness Cardiovascular: no Chest Pain, no Shortness of Breath, no palpitations, no leg swelling Respiratory: no cough, no shortness of breath  Gastrointestinal: no Nausea/Vomiting/Diarhhea Musculoskeletal: no muscle/joint aches Skin: no rashes Neurological: no tremors, no numbness, no tingling, no dizziness Psychiatric: no depression, no anxiety  PE: BP 114/72   Pulse 88   Ht 5\' 3"  (1.6 m)   Wt 230 lb 12.8 oz (104.7 kg)   BMI 40.88 kg/m , Body mass index is 40.88 kg/m. Wt  Readings from Last 3 Encounters:  11/05/19 230 lb 12.8 oz (104.7 kg)  10/21/19 229 lb 12.8 oz (104.2 kg)  09/19/19 234 lb (106.1 kg)    Constitutional: + Steady weight for height, not in acute distress, normal state of mind Eyes: PERRLA, EOMI, no exophthalmos ENT: moist mucous membranes, no gross thyromegaly, no gross cervical lymphadenopathy Cardiovascular: normal precordial activity, Regular Rate and Rhythm, no Murmur/Rubs/Gallops Respiratory:  adequate breathing efforts, no gross chest deformity, Clear to auscultation bilaterally Gastrointestinal: abdomen soft, Non -tender, No distension, Bowel Sounds present Musculoskeletal: no gross deformities, strength intact in all four extremities Skin: moist, warm, no rashes Neurological: no tremor with outstretched hands, Deep tendon reflexes normal in bilateral lower extremities.     CMP ( most recent) CMP     Component Value Date/Time   NA 138 10/21/2019 1209   K 4.5 10/21/2019 1209   CL 104 10/21/2019 1209   CO2 22 10/21/2019 1209   GLUCOSE 89 10/21/2019 1209   GLUCOSE 101 (H) 09/19/2019 1225   BUN 9 10/21/2019 1209   CREATININE 0.71 10/21/2019 1209   CALCIUM 11.2 (H) 10/21/2019 1209   PROT 7.4 09/19/2019 1225   PROT 7.1 08/20/2015 1257   ALBUMIN 4.1 09/19/2019 1225   ALBUMIN 4.4 08/20/2015 1257   AST 12 (L) 09/19/2019 1225   ALT 18 09/19/2019 1225   ALKPHOS 55 09/19/2019 1225   BILITOT 0.6 09/19/2019 1225   BILITOT 0.5 08/20/2015 1257   GFRNONAA 108 10/21/2019 1209   GFRAA 124 10/21/2019 1209      Lipid Panel ( most recent) Lipid Panel     Component Value Date/Time   CHOL 162 08/20/2015 1257   TRIG 73 08/20/2015 1257   HDL 54 08/20/2015 1257   CHOLHDL 3.0 08/20/2015 1257   LDLCALC 93 08/20/2015 1257   LABVLDL 15 08/20/2015 1257      Lab Results  Component Value Date   TSH 1.570 08/20/2015      Assessment: 1. Hypercalcemia / Hyperparathyroidism  Plan: Patient has had several instances of elevated  calcium, with the highest level being at 11.2 mg/dL. A corresponding intact PTH level was also high, at 119.  - Patient also  has vitamin D deficiency,  with the last level being 9.6.  I discussed and initiated high-dose vitamin D with ergocalciferol 50,000 units weekly. -It is likely that she has chronic primary hyperparathyroidism. -She has an apparent complication from chronic hypercalcemia: Nephrolithiasis which was recurrent and required surgical treatment at one point.    -She did not have any bone density in the past.  She denies osteoporosis or any fragility fractures.     - I discussed with the patient about the physiology of calcium and parathyroid hormone,  and possible  effects of  increased PTH/ Calcium , including kidney stones, cardiac dysrhythmias, osteoporosis, abdominal pain, etc.   - The work up so far is not sufficient to reach a conclusion for definitive therapy.  she  needs more studies to confirm and classify the parathyroid dysfunction she may have. I will proceed to obtain  repeat intact PTH/calcium, serum magnesium, serum phosphorus.  It is also essential to obtain 24-hour urine calcium/creatinine to rule out the rare but important cause of mild elevation in calcium and PTH- [place] ( Familial Hypocalciuric Hypercalcemia), which may not require any active intervention. -I will also include angiotensin-converting enzyme level.   -After her next visit, she will be considered for DEXA scan to include the distal  33% of  radius for evaluation of cortical bone, which is predominantly affected by hyperparathyroidism.   She will return in 2 weeks to review her results and discuss further next options.  If she is determined to have primary hyperparathyroidism, she is a surgical candidate.  She is advised to maintain close follow-up with her PMD Dr. Sallee Lange.  - Time spent with the patient: 60 minutes, of which >50% was spent in obtaining information about her symptoms, reviewing  her previous labs, evaluations, and treatments, counseling her about her chronic hypercalcemia complicated by nephrolithiasis, and developing a plan to confirm the diagnosis and long term treatment as necessary.  Please refer to " Patient Self Inventory" in the Media  tab for reviewed elements of pertinent patient history.  Hansel Starling Donigan participated in the discussions, expressed understanding, and voiced agreement with the above plans.  All questions were answered to her satisfaction. she is encouraged to contact clinic should she have any questions or concerns prior to her return visit.  - Return in about 2 weeks (around 11/19/2019) for F/U with Pre-visit Labs, [place], MD [facility] [address] [place], [place] [address] Phone: [phone]  Fax: [phone]    This note was partially dictated with voice recognition software. Similar sounding words can be transcribed inadequately or may not  be corrected upon review.  11/05/2019, 1:09 PM

## 2019-11-06 LAB — PTH, INTACT AND CALCIUM
Calcium: 11.1 mg/dL — ABNORMAL HIGH (ref 8.7–10.2)
PTH: 124 pg/mL — ABNORMAL HIGH (ref 15–65)

## 2019-11-06 LAB — PHOSPHORUS: Phosphorus: 2.1 mg/dL — ABNORMAL LOW (ref 3.0–4.3)

## 2019-11-06 LAB — MAGNESIUM: Magnesium: 2 mg/dL (ref 1.6–2.3)

## 2019-11-06 LAB — ANGIOTENSIN CONVERTING ENZYME: Angio Convert Enzyme: 38 U/L (ref 14–82)

## 2019-11-07 DIAGNOSIS — E212 Other hyperparathyroidism: Secondary | ICD-10-CM | POA: Diagnosis not present

## 2019-11-08 LAB — CREATININE, URINE, 24 HOUR
Creatinine, 24H Ur: 1592 mg/24 hr (ref 800–1800)
Creatinine, Urine: 159.2 mg/dL

## 2019-11-08 LAB — CALCIUM, URINE, 24 HOUR
Calcium, 24H Urine: 383 mg/24 hr — ABNORMAL HIGH (ref 0–320)
Calcium, Urine: 38.3 mg/dL

## 2019-11-11 ENCOUNTER — Encounter (HOSPITAL_COMMUNITY): Payer: Medicaid Other

## 2019-11-11 ENCOUNTER — Other Ambulatory Visit (HOSPITAL_COMMUNITY): Payer: Medicaid Other

## 2019-11-12 ENCOUNTER — Ambulatory Visit (HOSPITAL_COMMUNITY): Payer: Medicaid Other

## 2019-11-12 MED ORDER — DIAZEPAM 5 MG PO TABS
ORAL_TABLET | ORAL | 0 refills | Status: DC
Start: 2019-11-12 — End: 2019-12-01

## 2019-11-12 NOTE — Telephone Encounter (Signed)
Medication completed please fax

## 2019-11-12 NOTE — Telephone Encounter (Signed)
Rx faxed and pt notified

## 2019-11-19 ENCOUNTER — Ambulatory Visit (INDEPENDENT_AMBULATORY_CARE_PROVIDER_SITE_OTHER): Payer: Medicaid Other | Admitting: "Endocrinology

## 2019-11-19 ENCOUNTER — Telehealth: Payer: Self-pay | Admitting: "Endocrinology

## 2019-11-19 ENCOUNTER — Other Ambulatory Visit: Payer: Self-pay

## 2019-11-19 ENCOUNTER — Encounter: Payer: Self-pay | Admitting: "Endocrinology

## 2019-11-19 VITALS — BP 122/64 | HR 68 | Ht 63.0 in | Wt 231.0 lb

## 2019-11-19 DIAGNOSIS — E210 Primary hyperparathyroidism: Secondary | ICD-10-CM

## 2019-11-19 DIAGNOSIS — E559 Vitamin D deficiency, unspecified: Secondary | ICD-10-CM

## 2019-11-19 NOTE — Telephone Encounter (Signed)
Pt returned a call concerning a msg that she was supposed to do the Bone density on the same day as her Mammogram. My chart shows the bone Density on 10/20. Pt wants to know which date was correct.

## 2019-11-19 NOTE — Progress Notes (Signed)
11/19/2019, 4:10 PM  Endocrinology follow-up note  Brandi Dunn is a 39 y.o.-year-old female, referred by her  Kathyrn Drown, MD , returning with new labs for follow-up of hypercalcemia/hyperparathyroidism.    Past Medical History:  Diagnosis Date  . GERD (gastroesophageal reflux disease)   . H/O renal calculi   . Kidney stones   . Migraine    last migraine 03/24/11  . 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 CHOLECYSTECTOMY  04/12/2011   Procedure: LAPAROSCOPIC CHOLECYSTECTOMY;  Surgeon: Jamesetta So, MD;  Location: [facility];  Service: General;  Laterality: N/A;  . CYSTOSCOPY W/ URETERAL STENT PLACEMENT Left 09/10/2012   Procedure: CYSTOSCOPY WITH LEFT RETROGRADE PYELOGRAM; LEFT URETERAL STENT PLACEMENT;  Surgeon: Marissa Nestle, MD;  Location: [facility];  Service: Urology;  Laterality: Left;  . DILATION AND CURETTAGE OF UTERUS  2000?   x2, [facility]  . STONE EXTRACTION WITH BASKET Left 09/10/2012   Procedure: BALLOON DILATATION LEFT URETER; LEFT URETEROSCOPIC STONE EXTRACTION WITH BASKET;  Surgeon: Marissa Nestle, MD;  Location: [facility];  Service: Urology;  Laterality: Left;    Social History   Tobacco Use  . Smoking status: Never Smoker  . Smokeless tobacco: Never Used  Vaping Use  . Vaping Use: Never used  Substance Use Topics  . Alcohol use: No    Alcohol/week: 0.0 standard drinks  . Drug use: No    Family History  Problem Relation Age of Onset  . Hypertension Mother   . Hyperlipidemia Mother   . Hypertension Father   . Anesthesia problems Neg Hx   . Hypotension Neg Hx   . Malignant hyperthermia Neg Hx   . Pseudochol deficiency Neg Hx     Outpatient Encounter Medications as of 11/19/2019  Medication Sig  . diazepam (VALIUM) 5 MG tablet One tablet one hour before procedure Do not drive with medication-caution drowsiness  . levonorgestrel (MIRENA) 20 MCG/24HR IUD 1 each by Intrauterine route once.   Marland Kitchen LORazepam (ATIVAN) 1 MG tablet 1/2 to 1 qhs prn insomnia  . Vitamin D, Ergocalciferol, (DRISDOL) 1.25 MG (50000 UNIT) CAPS capsule Take 1 capsule (50,000 Units total) by mouth every 7 (seven) days.   No facility-administered encounter medications on file as of 11/19/2019.    No Known Allergies   HPI  Brandi Dunn was diagnosed with hypercalcemia in 2011.  History obtained directly from the patient patient reports history of recurrent nephrolithiasis which required surgery on 1 occasion.   She denies any prior history of thyroid, pituitary, adrenal dysfunction.  -Review of her referral package of most recent labs reveals calcium of 11.2 the corresponding PTH of 119 on October 21, 2019. Her repeat labs on November 05, 2019 shows calcium 11.1, PTH elevated at 124.  Her 24-hour urine calcium is elevated at 383.  Angiotensin-converting  enzyme level is normal at 38. -She does not have DEXA to review.  No prior history of fragility fractures or falls.  No history of CKD. Last BUN/Cr: 9/0.71  she is not on HCTZ or other thiazide therapy.  She has a history of profound vitamin D deficiency, currently not on supplement.  Vitamin D panel in February 2022 was such that 25-hydroxy vitamin D was 28, 1, 25 dihydroxy vitamin D was 34.   she is not on calcium supplements,  she eats dairy and green, leafy, vegetables on average amounts.  she does not  have a family history of hypercalcemia, pituitary tumors, thyroid cancer, or osteoporosis.     ROS:  Constitutional: no weight gain/loss, no fatigue, no subjective hyperthermia, no subjective hypothermia Eyes: no blurry vision, no xerophthalmia ENT: no sore throat, no nodules palpated in throat, no dysphagia/odynophagia, no hoarseness Cardiovascular: no Chest Pain, no Shortness of Breath, no palpitations, no leg swelling Respiratory: no cough, no shortness of breath  Gastrointestinal: no Nausea/Vomiting/Diarhhea Musculoskeletal: no  muscle/joint aches Skin: no rashes Neurological: no tremors, no numbness, no tingling, no dizziness Psychiatric: no depression, no anxiety  PE: BP 122/64   Pulse 68   Ht 5\' 3"  (1.6 m)   Wt 231 lb (104.8 kg)   BMI 40.92 kg/m , Body mass index is 40.92 kg/m. Wt Readings from Last 3 Encounters:  11/19/19 231 lb (104.8 kg)  11/05/19 230 lb 12.8 oz (104.7 kg)  10/21/19 229 lb 12.8 oz (104.2 kg)    Constitutional: + Steady weight for height, not in acute distress, normal state of mind Eyes: PERRLA, EOMI, no exophthalmos ENT: moist mucous membranes, no gross thyromegaly, no gross cervical lymphadenopathy Cardiovascular: normal precordial activity, Regular Rate and Rhythm, no Murmur/Rubs/Gallops Respiratory:  adequate breathing efforts, no gross chest deformity, Clear to auscultation bilaterally Gastrointestinal: abdomen soft, Non -tender, No distension, Bowel Sounds present Musculoskeletal: no gross deformities, strength intact in all four extremities Skin: moist, warm, no rashes Neurological: no tremor with outstretched hands, Deep tendon reflexes normal in bilateral lower extremities.     CMP ( most recent) CMP     Component Value Date/Time   NA 138 10/21/2019 1209   K 4.5 10/21/2019 1209   CL 104 10/21/2019 1209   CO2 22 10/21/2019 1209   GLUCOSE 89 10/21/2019 1209   GLUCOSE 101 (H) 09/19/2019 1225   BUN 9 10/21/2019 1209   CREATININE 0.71 10/21/2019 1209   CALCIUM 11.1 (H) 11/05/2019 0933   PROT 7.4 09/19/2019 1225   PROT 7.1 08/20/2015 1257   ALBUMIN 4.1 09/19/2019 1225   ALBUMIN 4.4 08/20/2015 1257   AST 12 (L) 09/19/2019 1225   ALT 18 09/19/2019 1225   ALKPHOS 55 09/19/2019 1225   BILITOT 0.6 09/19/2019 1225   BILITOT 0.5 08/20/2015 1257   GFRNONAA 108 10/21/2019 1209   GFRAA 124 10/21/2019 1209      Lipid Panel ( most recent) Lipid Panel     Component Value Date/Time   CHOL 162 08/20/2015 1257   TRIG 73 08/20/2015 1257   HDL 54 08/20/2015 1257   CHOLHDL  3.0 08/20/2015 1257   LDLCALC 93 08/20/2015 1257   LABVLDL 15 08/20/2015 1257      Lab Results  Component Value Date   TSH 1.570 08/20/2015      Assessment: 1. Hypercalcemia / Hyperparathyroidism  Plan: Patient has had several instances of elevated calcium, with the highest level being at 11.2 mg/dL. A corresponding intact PTH level was also high, at 119-124. - Patient also  has vitamin D deficiency,  with the last level being 9.6.  I discussed and initiated high-dose vitamin D with ergocalciferol 50,000 units weekly. Her 24-hour urine calcium is elevated at 383 mg per 24 hours. -Her work-up is confirming primary hyperparathyroidism.    -She has an apparent complication from chronic hypercalcemia: Nephrolithiasis which was recurrent and required surgical treatment at one point.    -She did not have any bone density in the past, this will be requested to be done as soon as possible. -I discussed her options of treatment.  She is a surgical candidate given her youth, complications. She will be referred to Dr. Armandina Gemma at [facility] surgery.   She will return in 2 months  (with repeat labs after her surgery).  She is advised to maintain close follow-up with her PMD Dr. Sallee Lange.     - Time spent on this patient care encounter:  30 minutes of which 50% was spent in  counseling and the rest reviewing  her current and  previous labs / studies and medications  doses and developing a plan for long term care. Hansel Starling Sweetman  participated in the discussions, expressed understanding, and voiced agreement with the above plans.  All questions were answered to her satisfaction. she is encouraged to contact clinic should she have any questions or concerns prior to her return visit.   - Return in about 9 weeks (around 01/21/2020) for F/U with Labs after Surgery.   Glade Lloyd, MD [facility] [address] [place],  [place] [address] Phone: [phone]  Fax: [phone]    This note was partially dictated with voice recognition software. Similar sounding words can be transcribed inadequately or may not  be corrected upon review.  11/19/2019, 4:10 PM

## 2019-11-19 NOTE — Telephone Encounter (Signed)
Returned call to patient and advised of appt was moved to the 10/20 due to scheduling. Pt verbalized understanding.

## 2019-11-20 ENCOUNTER — Other Ambulatory Visit: Payer: Self-pay | Admitting: Family Medicine

## 2019-11-20 ENCOUNTER — Ambulatory Visit (HOSPITAL_COMMUNITY)
Admission: RE | Admit: 2019-11-20 | Discharge: 2019-11-20 | Disposition: A | Discharge status: Home / Self Care | Payer: Medicaid Other | Source: Ambulatory Visit | Attending: Family Medicine | Admitting: Family Medicine

## 2019-11-20 DIAGNOSIS — K862 Cyst of pancreas: Secondary | ICD-10-CM | POA: Diagnosis not present

## 2019-11-20 IMAGING — MR MR ABDOMEN WO/W CM
21 of 22 series · 44 of 48 positions shown · IV contrast (gadavist)
Comparison: Noncontrast CT on [DATE]

CLINICAL DATA: Pancreatic lesion on recent noncontrast abdomen CT.

EXAM:
MRI ABDOMEN WITHOUT AND WITH CONTRAST
TECHNIQUE: Multiplanar multisequence MR imaging of the abdomen was performed
both before and after the administration of intravenous contrast.
CONTRAST:  10mL GADAVIST GADOBUTROL 1 MMOL/ML IV SOLN

[Series 4: ax haste · axial · 6.0mm · 1.25mm/px · z∈[-52,+172]mm · 2 of 32 slices shown]
[im 1/32]
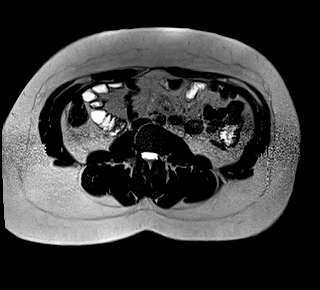
[im 32/32]
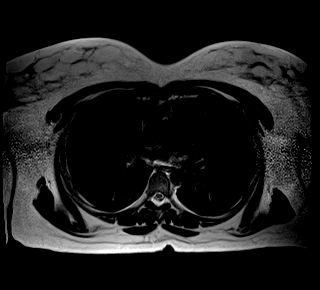

[Series 5: cor haste · coronal · 6.0mm · 1.25mm/px · 1 of 26 slices shown]
[im 1/26]
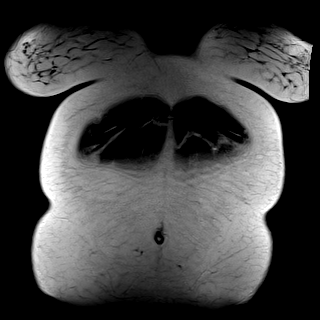

[Series 7: T2 fat-sat · axial · 6.0mm · 1.25mm/px · 1 of 34 slices shown]
[im 1/34]
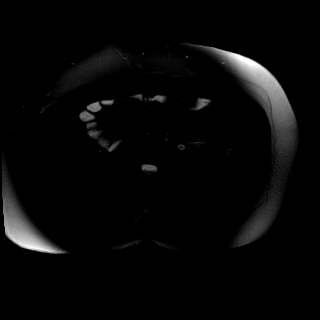

[Series 8: DWI · axial · 6.0mm · 1.42mm/px · 1 of 30 slices shown (1 of 4)]
[im 1/30]
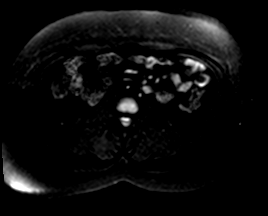

[Series 8: DWI · axial · 6.0mm · 1.42mm/px · 1 of 30 slices shown (2 of 4)]
[im 1/30]
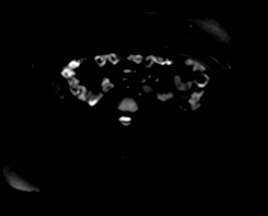

[Series 8: DWI · axial · 6.0mm · 1.42mm/px · 1 of 30 slices shown (3 of 4)]
[im 1/30]
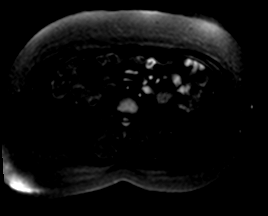

[Series 9: DWI · axial · 6.0mm · 1.42mm/px · 1 of 30 slices shown (4 of 4)]
[im 1/30]
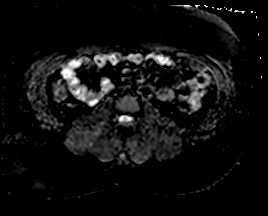

[Series 10: ax in and · axial · 3.0mm · 1.25mm/px · z∈[-50,+163]mm · 3 of 72 slices shown (1 of 2)]
[im 1/72]
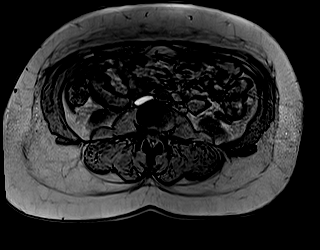
[im 36/72]
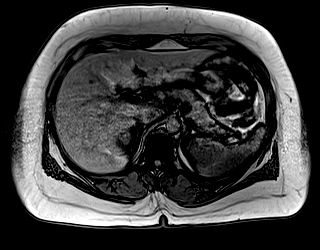
[im 72/72]
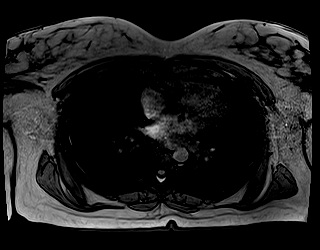

[Series 11: ax in and · axial · 3.0mm · 1.25mm/px · z∈[-50,+163]mm · 3 of 72 slices shown (2 of 2)]
[im 1/72]
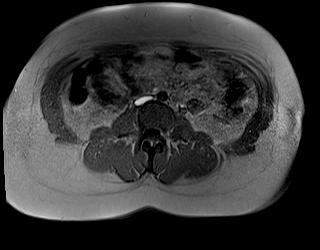
[im 36/72]
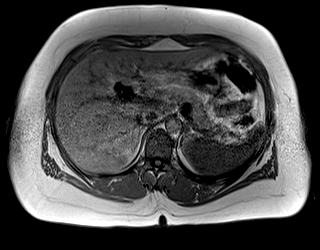
[im 72/72]
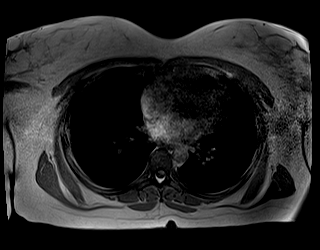

[Series 14: MRCP · coronal · 4.0mm · 1.25mm/px · 1 of 20 slices shown (1 of 3)]
[im 1/20]
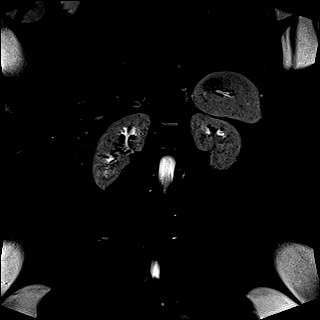

[Series 17: MRCP · coronal · 1.2mm · 0.49mm/px · 2 of 64 slices shown (2 of 3)]
[im 1/64]
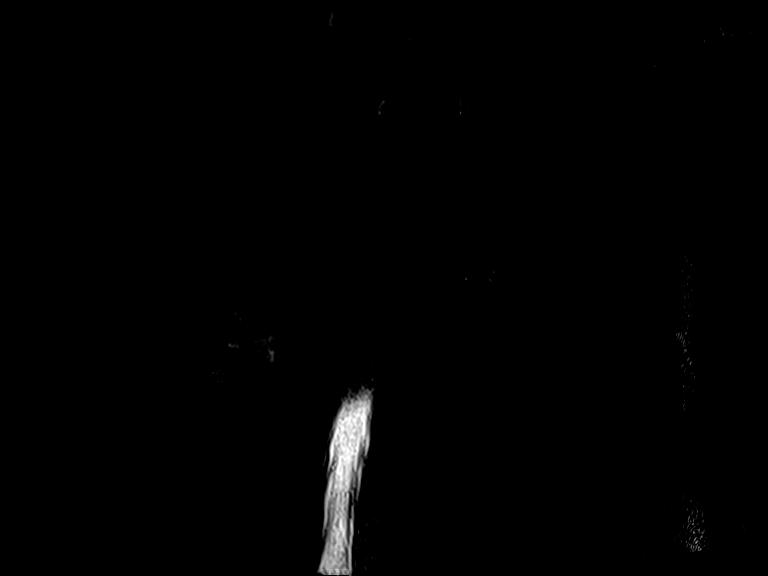
[im 64/64]
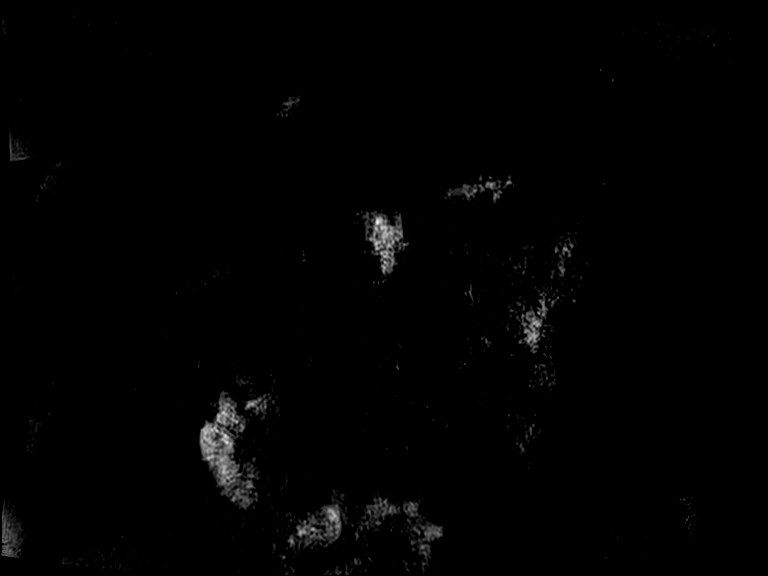

[Series 18: MRCP · sagittal · 0.49mm/px · 1 of 19 slices shown (3 of 3)]
[im 1/19]
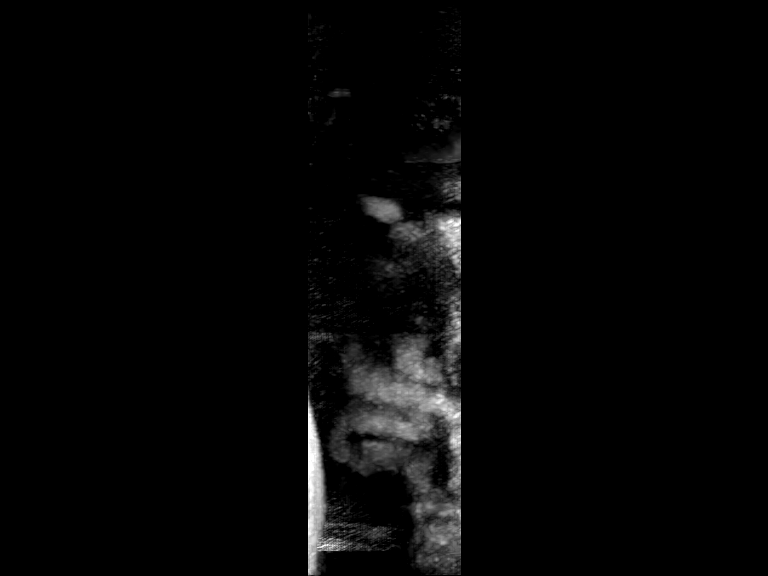

[Series 19: T1 dynamic · axial · non-contrast · 3.0mm · 1.25mm/px · z∈[-58,+179]mm · 3 of 80 slices shown (1 of 4)]
[im 1/80]
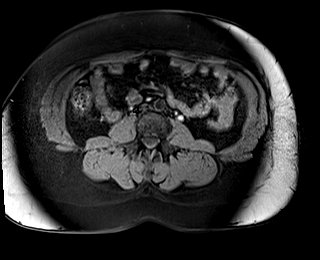
[im 40/80]
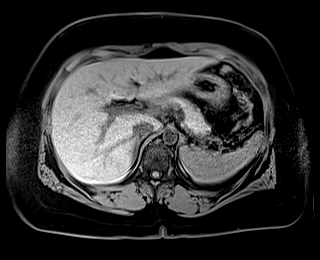
[im 80/80]
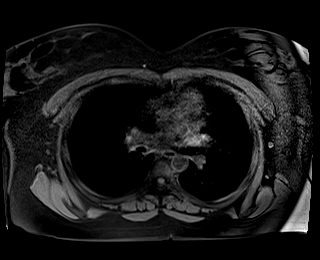

[Series 21: T1 dynamic post-contrast · axial · 3.0mm · 1.25mm/px · z∈[-58,+179]mm · 3 of 80 slices shown (1 of 5)]
[im 1/80]
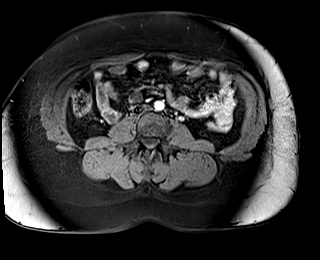
[im 40/80]
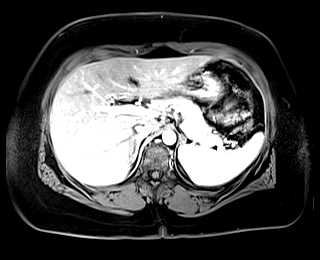
[im 80/80]
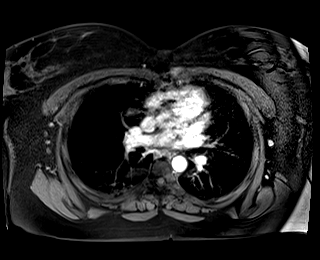

[Series 22: T1 dynamic · axial · 3.0mm · 1.25mm/px · z∈[-58,+179]mm · 3 of 80 slices shown (2 of 4)]
[im 1/80]
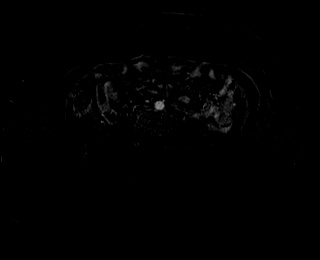
[im 40/80]
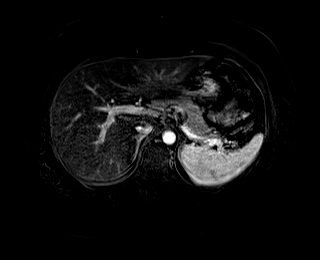
[im 80/80]
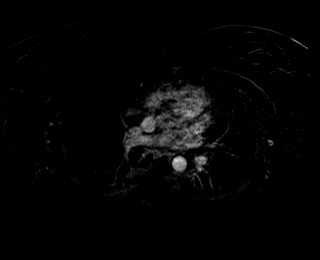

[Series 23: T1 dynamic post-contrast · axial · 3.0mm · 1.25mm/px · z∈[-58,+179]mm · 3 of 80 slices shown (2 of 5)]
[im 1/80]
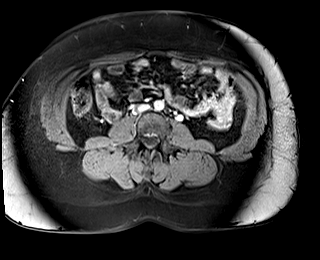
[im 40/80]
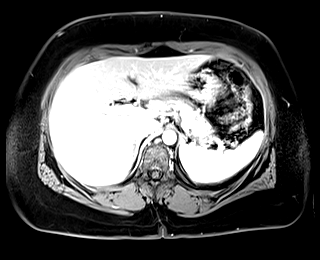
[im 80/80]
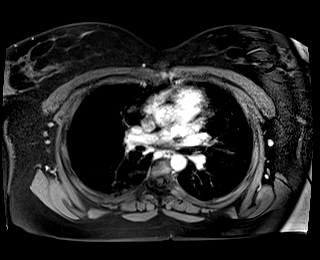

[Series 24: T1 dynamic · axial · 3.0mm · 1.25mm/px · z∈[-58,+179]mm · 3 of 80 slices shown (3 of 4)]
[im 1/80]
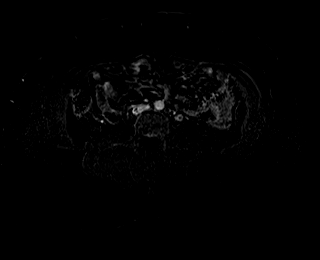
[im 40/80]
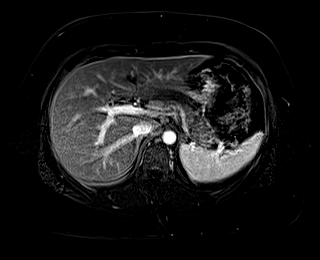
[im 80/80]
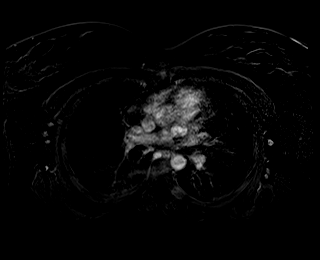

[Series 25: T1 dynamic post-contrast · axial · 3.0mm · 1.25mm/px · z∈[-58,+179]mm · 3 of 80 slices shown (3 of 5)]
[im 1/80]
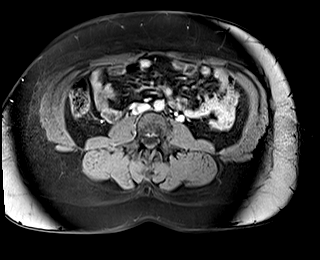
[im 40/80]
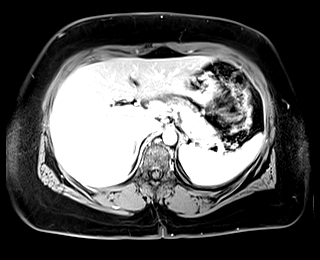
[im 80/80]
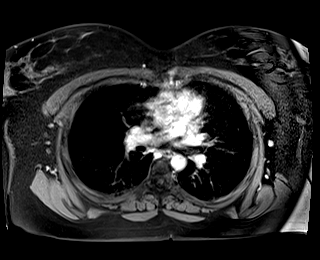

[Series 26: T1 dynamic · axial · 3.0mm · 1.25mm/px · z∈[-58,+179]mm · 3 of 80 slices shown (4 of 4)]
[im 1/80]
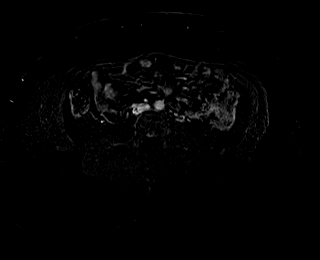
[im 40/80]
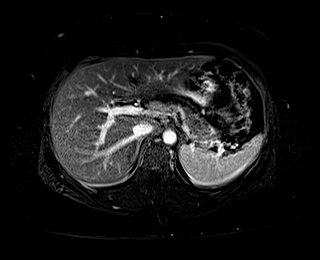
[im 80/80]
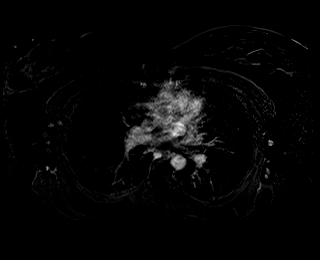

[Series 27: T1 dynamic post-contrast · coronal · 3.0mm · 1.31mm/px · 3 of 72 slices shown (4 of 5)]
[im 1/72]
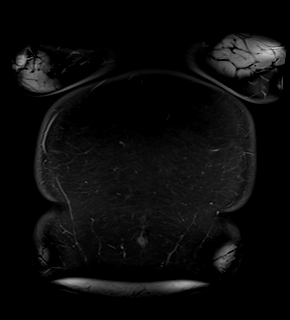
[im 36/72]
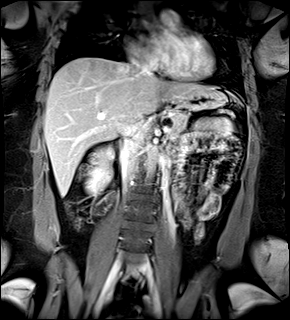
[im 72/72]
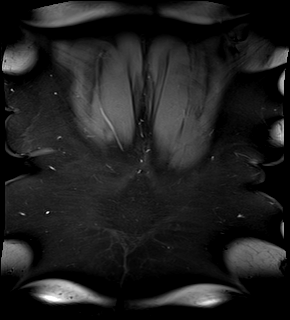

[Series 28: T1 dynamic post-contrast · axial · 3.0mm · 1.25mm/px · z∈[-58,+59]mm · 2 of 80 slices shown (5 of 5)]
[im 1/80]
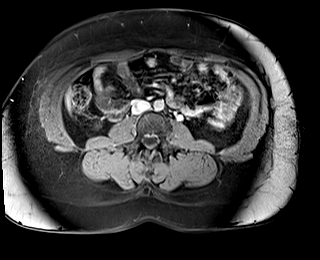
[im 40/80]
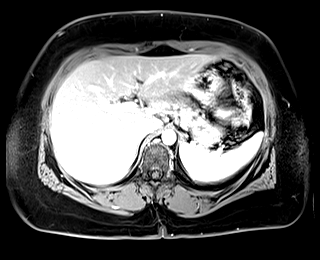

[44 of 48 positions shown; findings below may reference images not displayed]

FINDINGS: Lower chest: No acute findings.

Hepatobiliary: No hepatic masses identified. Prior cholecystectomy.
No evidence of biliary obstruction.

Pancreas: A 1.7 cm simple appearing cyst is seen in the pancreatic
body (image 43/21). No other pancreatic lesions identified there is
no evidence of pancreatic ductal dilatation.

Spleen:  Within normal limits in size and appearance.

Adrenals/Urinary Tract: No masses identified. No evidence of
hydronephrosis.

Stomach/Bowel: Visualized portion unremarkable.

Vascular/Lymphatic: No pathologically enlarged lymph nodes
identified. No abdominal aortic aneurysm.

Other:  None.

Musculoskeletal:  No suspicious bone lesions identified.
IMPRESSION: 1.7 cm simple appearing cyst in the pancreatic body, likely
representing an indolent cystic neoplasm such as a side-branch IPMN.
Recommend continued follow-up by MRI in 6 months. This
recommendation follows ACR consensus guidelines: Management of
Incidental Pancreatic Cysts: A White Paper of the ACR Incidental
Findings Committee. [HOSPITAL] [TH];[DATE].

No other significant abnormality identified.

## 2019-11-20 MED ORDER — GADOBUTROL 1 MMOL/ML IV SOLN
10.0000 mL | Freq: Once | INTRAVENOUS | Status: AC | PRN
  Administered 2019-11-20: 10 mL via INTRAVENOUS

## 2019-11-25 ENCOUNTER — Other Ambulatory Visit: Payer: Self-pay

## 2019-11-25 ENCOUNTER — Ambulatory Visit (HOSPITAL_COMMUNITY)
Admission: RE | Admit: 2019-11-25 | Discharge: 2019-11-25 | Disposition: A | Discharge status: Home / Self Care | Payer: Medicaid Other | Source: Ambulatory Visit | Attending: Family Medicine | Admitting: Family Medicine

## 2019-11-25 ENCOUNTER — Other Ambulatory Visit (HOSPITAL_COMMUNITY): Payer: Self-pay | Admitting: Family Medicine

## 2019-11-25 DIAGNOSIS — N6002 Solitary cyst of left breast: Secondary | ICD-10-CM

## 2019-11-25 DIAGNOSIS — N6489 Other specified disorders of breast: Secondary | ICD-10-CM | POA: Diagnosis not present

## 2019-11-25 DIAGNOSIS — R928 Other abnormal and inconclusive findings on diagnostic imaging of breast: Secondary | ICD-10-CM

## 2019-11-25 DIAGNOSIS — R921 Mammographic calcification found on diagnostic imaging of breast: Secondary | ICD-10-CM | POA: Diagnosis not present

## 2019-11-25 IMAGING — MG MM DIGITAL DIAGNOSTIC UNILAT*L* W/ TOMO W/ CAD
8 series · 8 of 20 positions shown · non-contrast
Comparison: Previous exams.

CLINICAL DATA: Short-term follow-up for probably benign left breast
asymmetry.

EXAM:
DIGITAL DIAGNOSTIC UNILATERAL LEFT MAMMOGRAM WITH TOMO AND CAD;
ULTRASOUND LEFT BREAST LIMITED

[L ML]
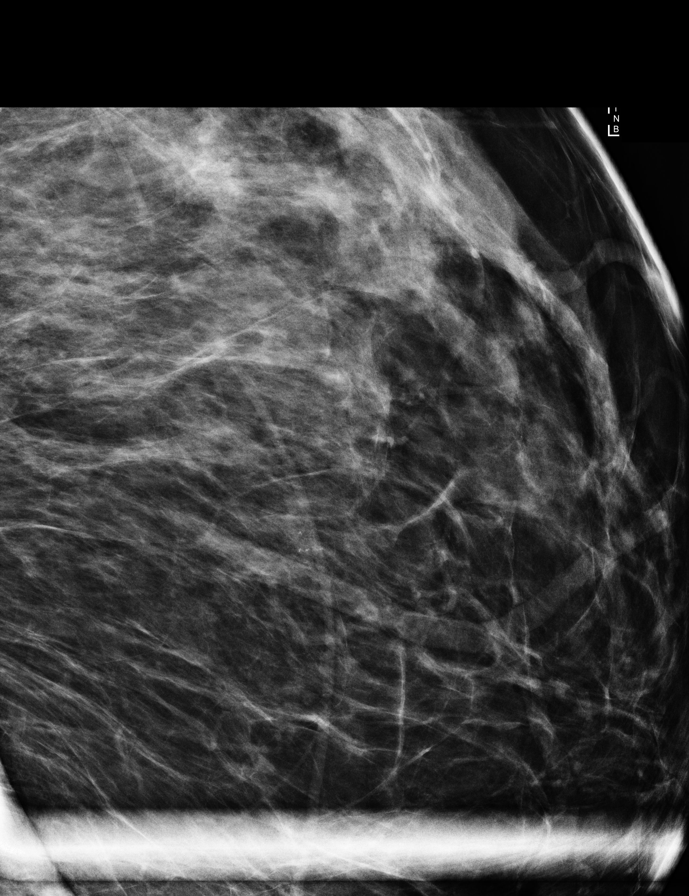

[L CC]
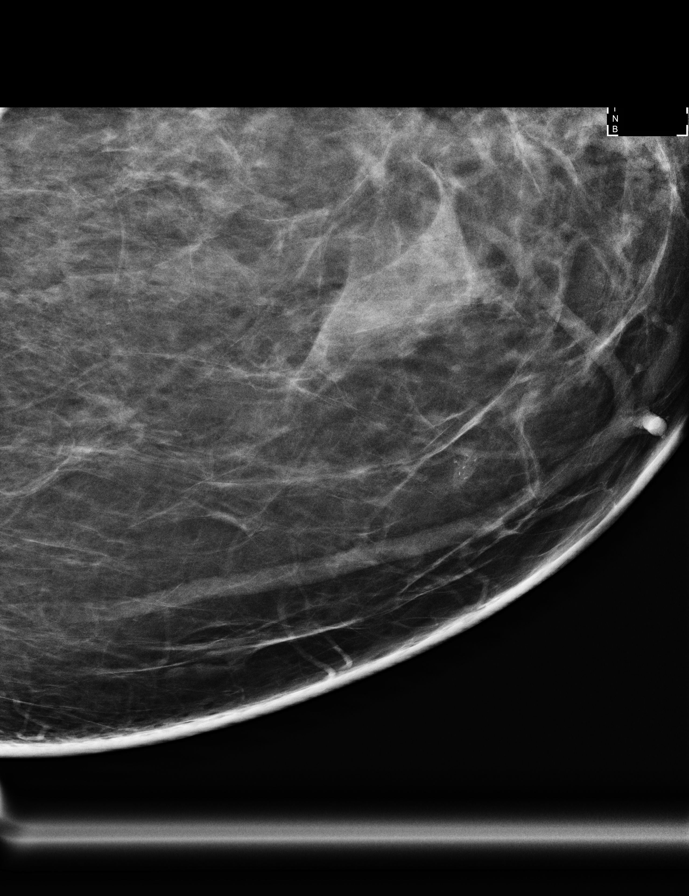

[L CC synth-2D]
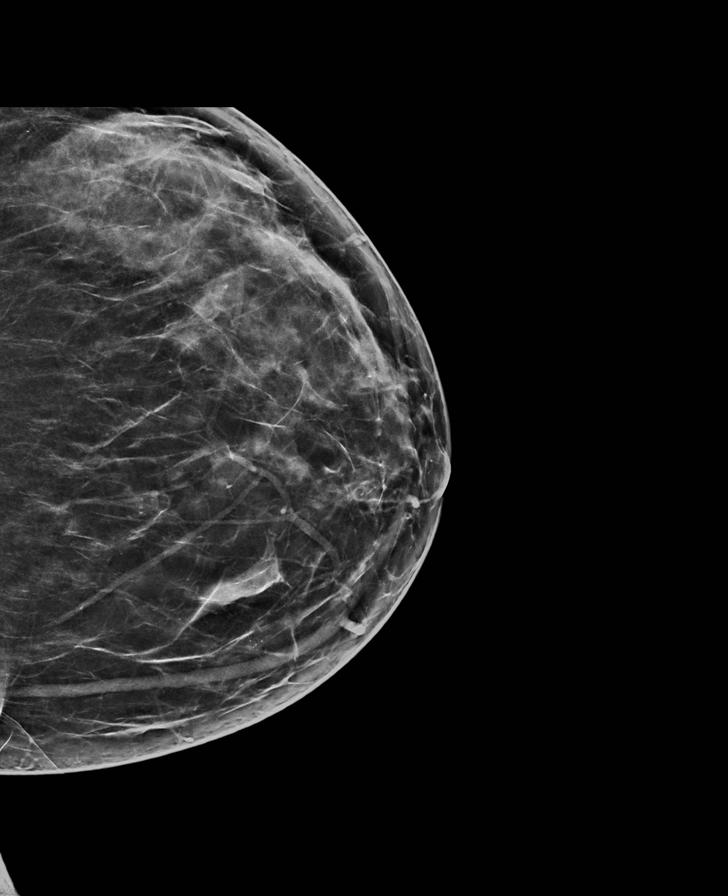

[L ML synth-2D]
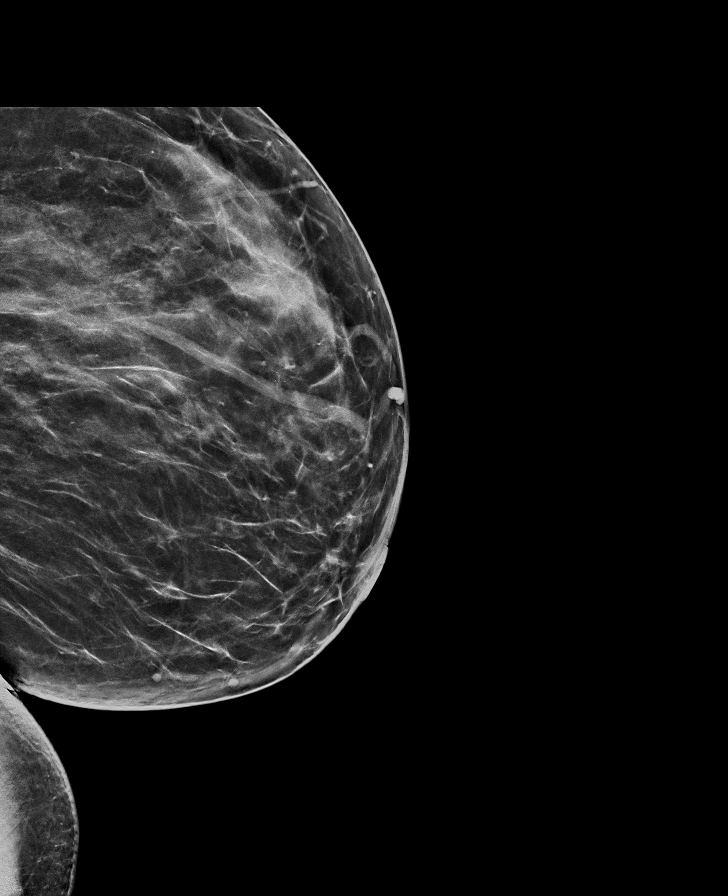

[L MLO synth-2D]
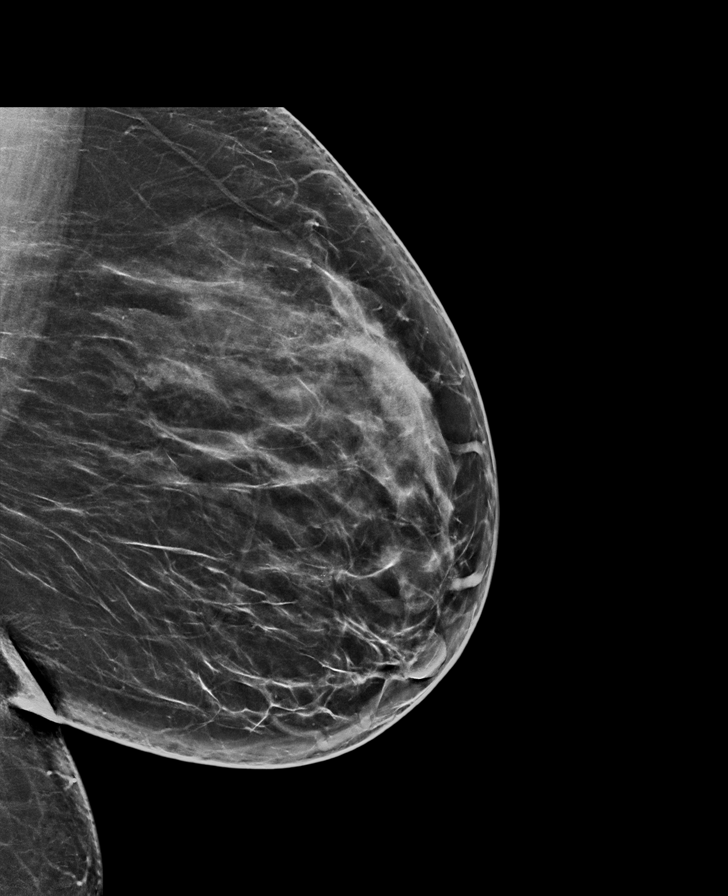

[L CC tomo · tomo slice 35/70.0]
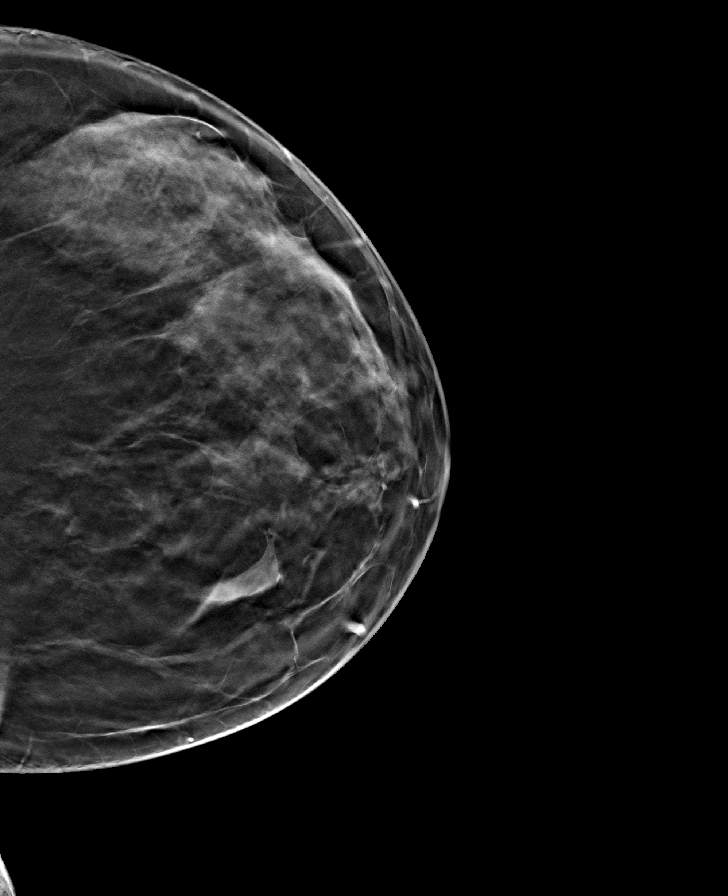

[L MLO tomo · tomo slice 38/75.0]
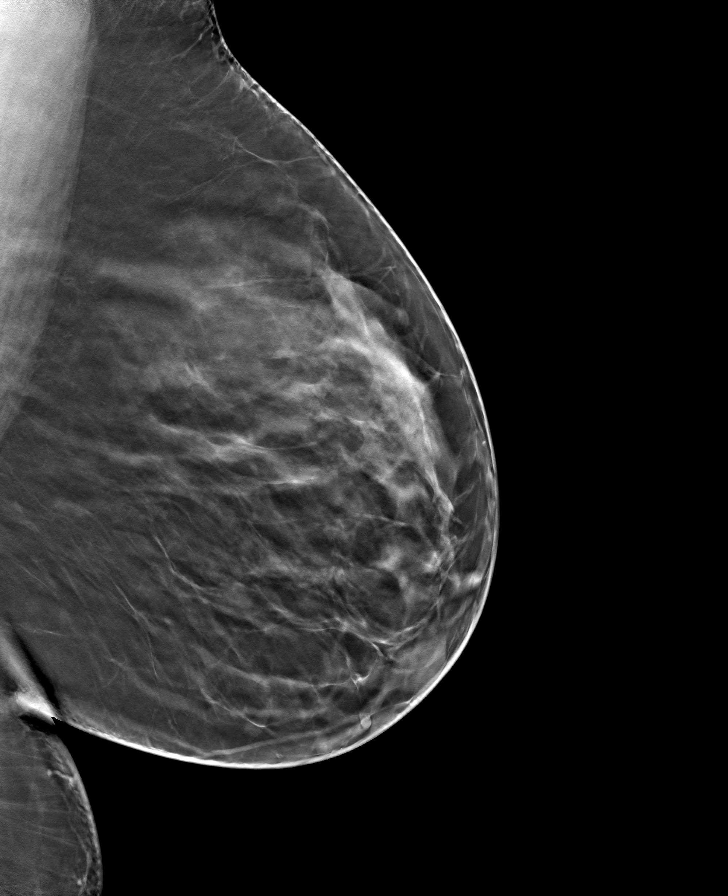

[L ML tomo · tomo slice 35/68.0]
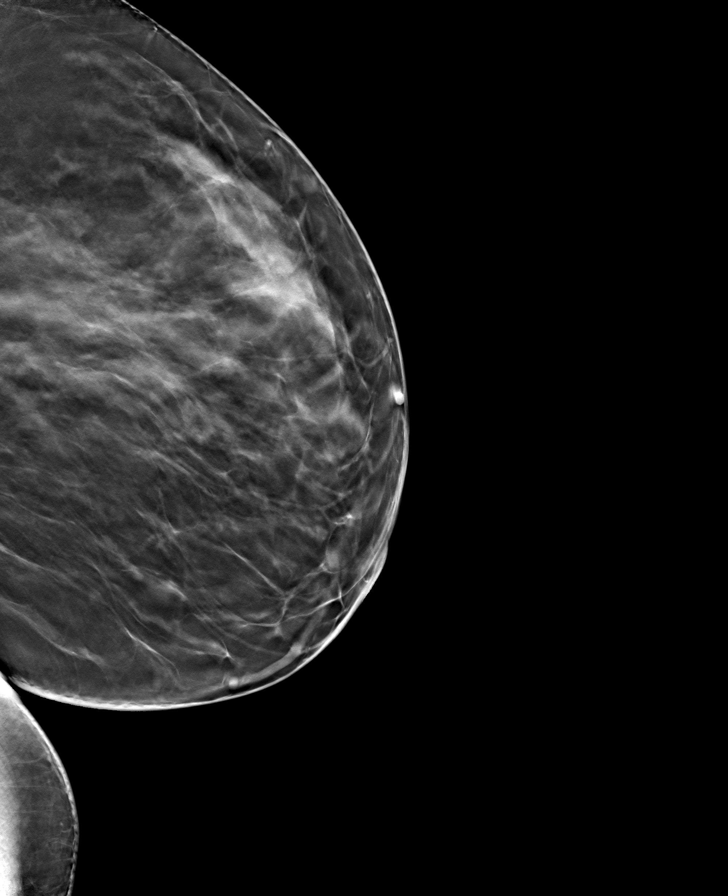

[8 of 20 positions shown; findings below may reference images not displayed]

ACR Breast Density Category c: The breast tissue is heterogeneously
dense, which may obscure small masses.
FINDINGS: Stable appearance of the asymmetry in the slightly inner left
breast, felt to represent an island of fibroglandular tissue. There
is a new 0.4 cm group of indeterminate calcifications in the
slightly upper inner breast, some of which may layer on the ML
images.

Mammographic images were processed with CAD.

Targeted ultrasound of the inner left breast was performed. The oval
hypoechoic mass in the left breast the [DATE] position 6 cm from
nipple now appears more complicated possibly containing
calcifications. This measures 0.5 x 0.2 x 0.5 cm, similar in size
when compared to prior exam. This demonstrates imaging features
suggestive of a complicated cyst containing calcifications and may
correspond with the new calcifications seen in the inner left breast
at mammography.

No lymphadenopathy seen in the left axilla.
IMPRESSION: New indeterminate 0.4 cm group of calcifications in the slightly
upper inner left breast which may correspond with a mass seen in the
left breast at the [DATE] position on targeted ultrasound.

RECOMMENDATION:
1. Recommend ultrasound-guided core biopsy of the mass in the left
breast at the [DATE] position.

2. If the biopsy marking clip from the ultrasound-guided biopsy of
the mass does not correspond with the calcifications seen in the
inner left breast at mammography, then recommend stereotactic guided
biopsy of the 0.4 cm group of calcifications.

I have discussed the findings and recommendations with the patient.
If applicable, a reminder letter will be sent to the patient
regarding the next appointment.

BI-RADS CATEGORY  4: Suspicious.

## 2019-11-25 IMAGING — US US BREAST*L* LIMITED INC AXILLA
1 series · 12 of 12 positions shown · non-contrast
Comparison: Previous exams.

CLINICAL DATA: Short-term follow-up for probably benign left breast
asymmetry.

EXAM:
DIGITAL DIAGNOSTIC UNILATERAL LEFT MAMMOGRAM WITH TOMO AND CAD;
ULTRASOUND LEFT BREAST LIMITED

[Series 1: us breast*left* limited inc axilla · 0.07mm/px · 12 of 12 slices shown]
[im 1/12]
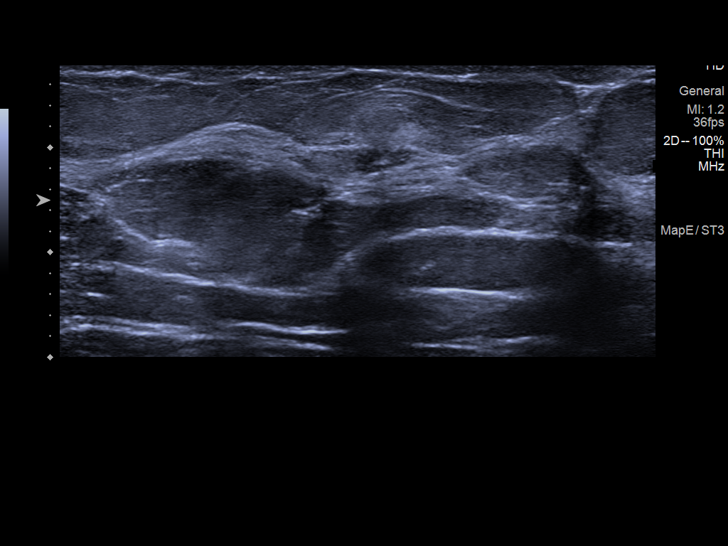
[im 2/12]
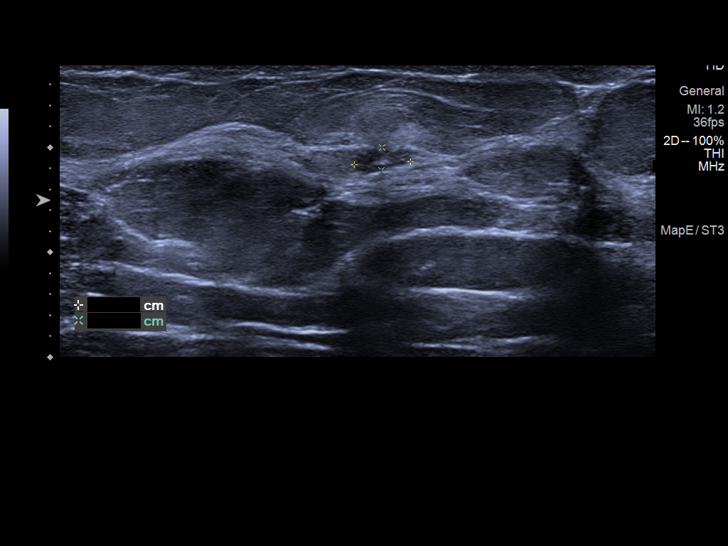
[im 3/12]
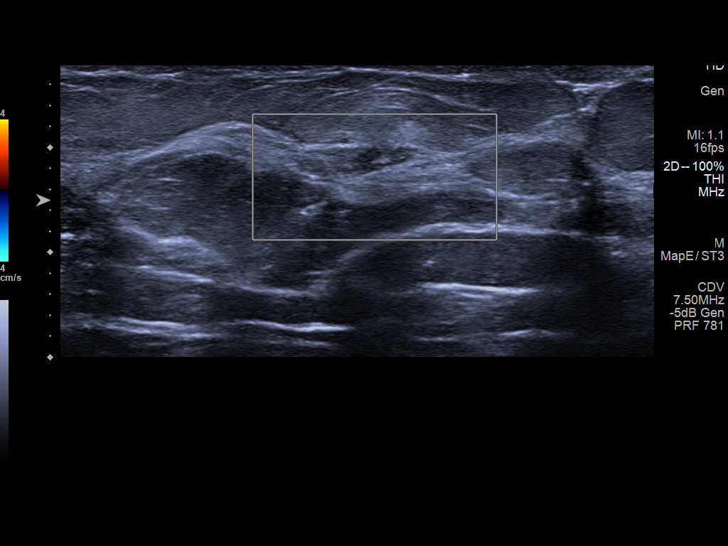
[im 4/12]
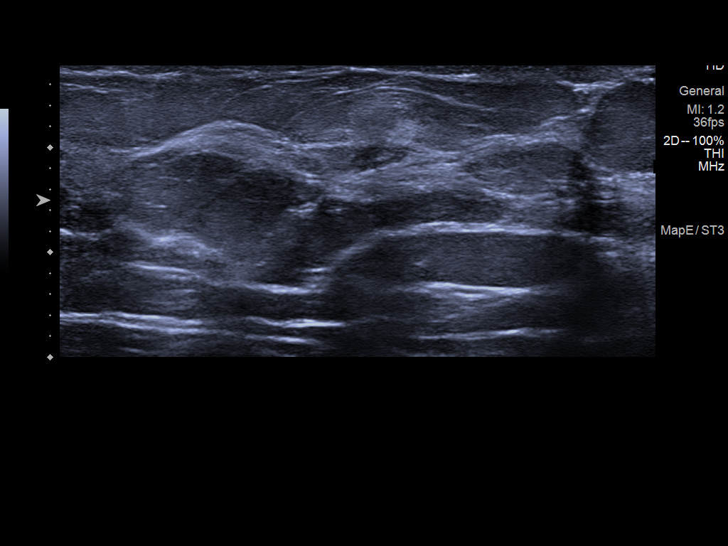
[im 5/12]
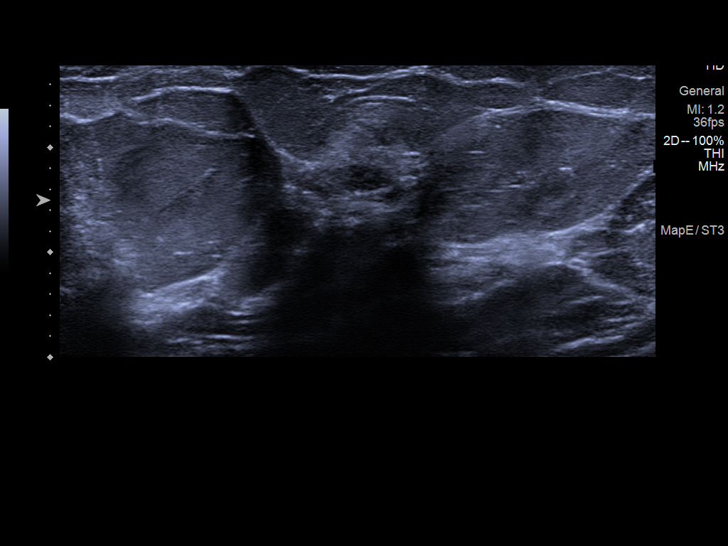
[im 6/12]
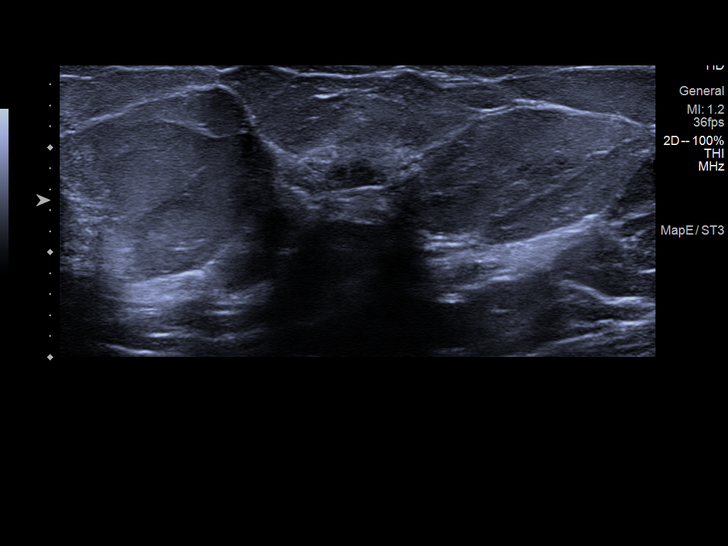
[im 7/12]
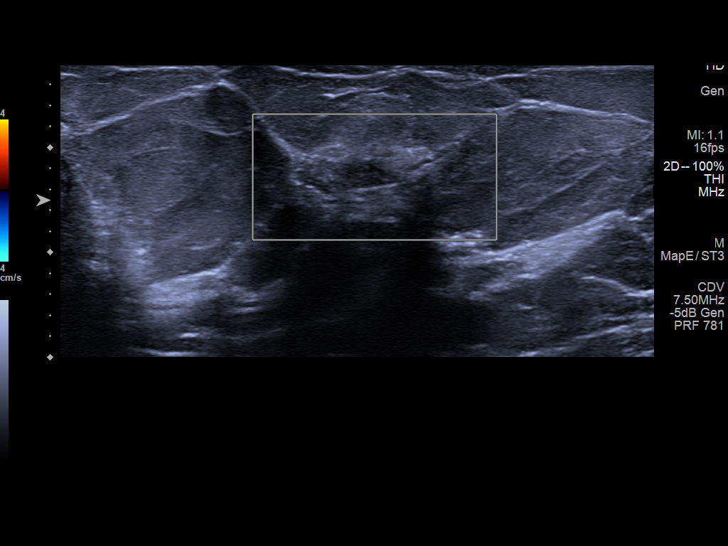
[im 8/12]
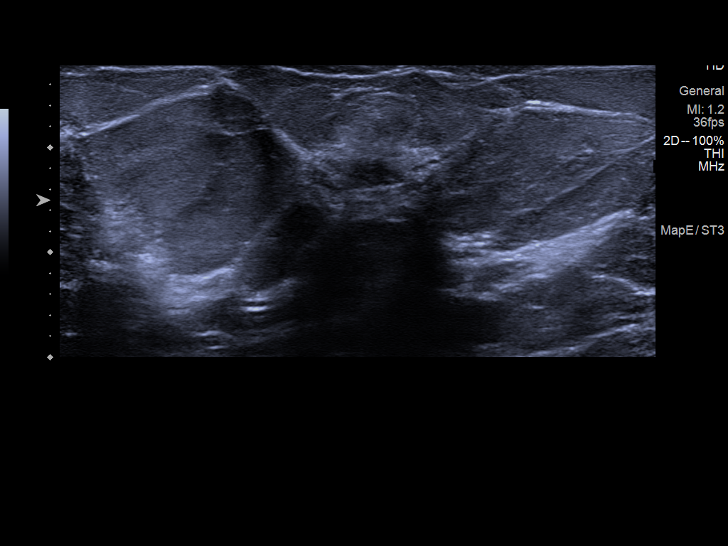
[im 9/12]
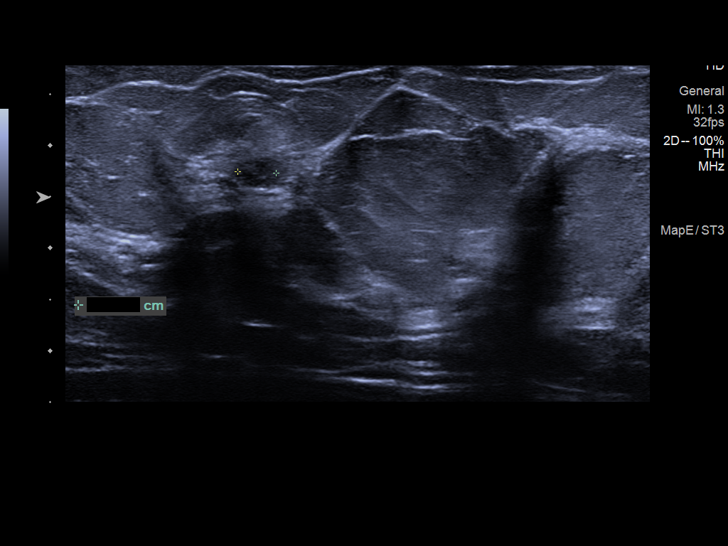
[im 10/12]
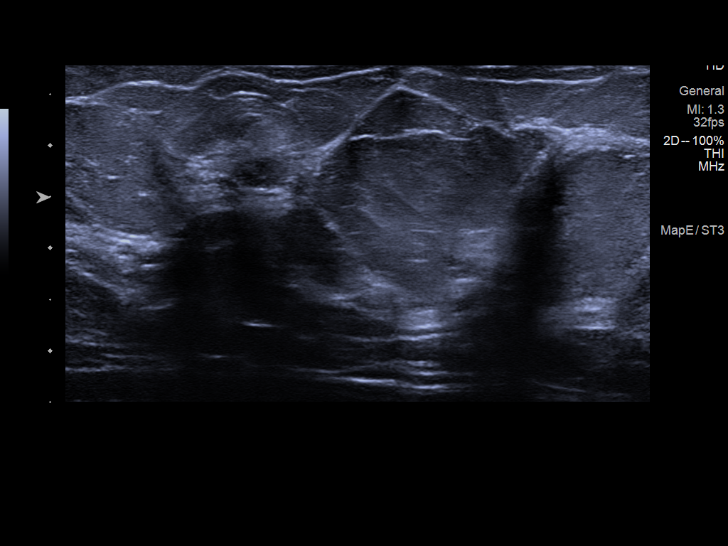
[im 11/12]
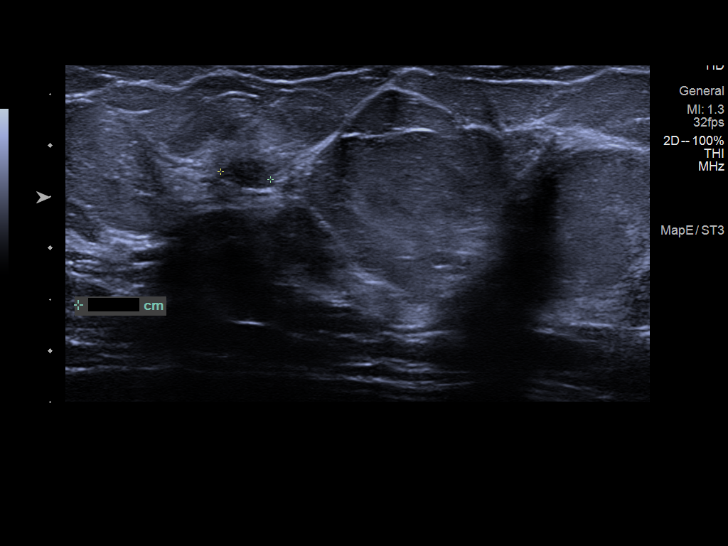
[im 12/12]
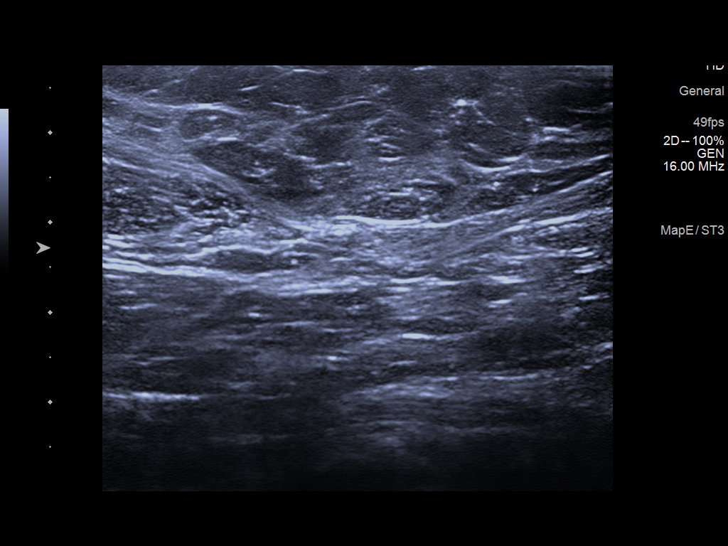

[12 of 12 positions shown; findings below may reference images not displayed]

ACR Breast Density Category c: The breast tissue is heterogeneously
dense, which may obscure small masses.
FINDINGS: Stable appearance of the asymmetry in the slightly inner left
breast, felt to represent an island of fibroglandular tissue. There
is a new 0.4 cm group of indeterminate calcifications in the
slightly upper inner breast, some of which may layer on the ML
images.

Mammographic images were processed with CAD.

Targeted ultrasound of the inner left breast was performed. The oval
hypoechoic mass in the left breast the [DATE] position 6 cm from
nipple now appears more complicated possibly containing
calcifications. This measures 0.5 x 0.2 x 0.5 cm, similar in size
when compared to prior exam. This demonstrates imaging features
suggestive of a complicated cyst containing calcifications and may
correspond with the new calcifications seen in the inner left breast
at mammography.

No lymphadenopathy seen in the left axilla.
IMPRESSION: New indeterminate 0.4 cm group of calcifications in the slightly
upper inner left breast which may correspond with a mass seen in the
left breast at the [DATE] position on targeted ultrasound.

RECOMMENDATION:
1. Recommend ultrasound-guided core biopsy of the mass in the left
breast at the [DATE] position.

2. If the biopsy marking clip from the ultrasound-guided biopsy of
the mass does not correspond with the calcifications seen in the
inner left breast at mammography, then recommend stereotactic guided
biopsy of the 0.4 cm group of calcifications.

I have discussed the findings and recommendations with the patient.
If applicable, a reminder letter will be sent to the patient
regarding the next appointment.

BI-RADS CATEGORY  4: Suspicious.

## 2019-11-26 ENCOUNTER — Encounter (HOSPITAL_COMMUNITY): Payer: Self-pay

## 2019-11-26 ENCOUNTER — Ambulatory Visit (HOSPITAL_COMMUNITY): Admission: RE | Admit: 2019-11-26 | Payer: Medicaid Other | Source: Ambulatory Visit

## 2019-12-01 ENCOUNTER — Ambulatory Visit (INDEPENDENT_AMBULATORY_CARE_PROVIDER_SITE_OTHER): Payer: Medicaid Other | Admitting: Family Medicine

## 2019-12-01 ENCOUNTER — Encounter: Payer: Self-pay | Admitting: Family Medicine

## 2019-12-01 ENCOUNTER — Other Ambulatory Visit: Payer: Self-pay

## 2019-12-01 VITALS — BP 122/74 | HR 99 | Temp 97.7°F | Ht 63.0 in | Wt 238.4 lb

## 2019-12-01 DIAGNOSIS — B379 Candidiasis, unspecified: Secondary | ICD-10-CM | POA: Diagnosis not present

## 2019-12-01 MED ORDER — FLUCONAZOLE 150 MG PO TABS: 150.0000 mg | ORAL_TABLET | Freq: Once | ORAL | 0 refills | Status: AC

## 2019-12-01 NOTE — Progress Notes (Signed)
Pt here for possible yeast infection. Began last Friday. Has tried OTC med but no relief. Irritation, itchy and when pt wipes she is wiping yeast.     Patient ID: [name], female    DOB: Oct 27, 1980, 39 y.o.   MRN: [id number]   Chief Complaint  Patient presents with  . Vaginitis   Subjective:  CC: vaginal itching, irritation, cottage cheese consistency discharge  Presents today for possible yeast infection, symptoms started last Friday.  Symptoms include itching with wiping, irritation, and a cottage cheeselike discharge.  She had some old yeast infection cream at home, this has not been effective.  It is noted that on August 13 she was diagnosed with a urinary tract infection treated with Keflex.  Has not been on an antibiotic since August.  Denies increased sexual activity.  Reports that she received a [place] wax 3 weeks ago, and 2 weeks ago she started using this "scrub "in the vaginal area.  This is the only thing different that she has done, and could be the reason for the yeast infection.  She denies fever, chills denies any urinary symptoms, no burning, no frequency, no urgency.    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   Outpatient Encounter Medications as of 12/01/2019  Medication Sig  . levonorgestrel (MIRENA) 20 MCG/24HR IUD 1 each by Intrauterine route once.  . fluconazole (DIFLUCAN) 150 MG tablet Take 1 tablet (150 mg total) by mouth once for 1 dose. If symptoms persist, may repeat in 3 days.  . Vitamin D, Ergocalciferol, (DRISDOL) 1.25 MG (50000 UNIT) CAPS capsule Take 1 capsule (50,000 Units total) by mouth every 7 (seven) days. (Patient not taking: Reported on 12/01/2019)  . [DISCONTINUED] diazepam (VALIUM) 5 MG tablet One tablet one hour before procedure Do not drive with medication-caution drowsiness  . [DISCONTINUED]  LORazepam (ATIVAN) 1 MG tablet 1/2 to 1 qhs prn insomnia   No facility-administered encounter medications on file as of 12/01/2019.     Review of Systems  Constitutional: Negative.   HENT: Negative.   Eyes: Negative.   Respiratory: Negative.   Cardiovascular: Negative.   Endocrine: Negative.   Genitourinary: Positive for vaginal discharge. Negative for difficulty urinating, dysuria and frequency.       Vaginal itching, cottage cheese-like discharge  Musculoskeletal: Negative.   Skin: Negative.   Neurological: Negative.   Hematological: Negative.   Psychiatric/Behavioral: Negative.      Vitals BP 122/74   Pulse 99   Temp 97.7 F (36.5 C)   Ht 5\' 3"  (1.6 m)   Wt 238 lb 6.4 oz (108.1 kg)   LMP 11/25/2019   SpO2 99%   BMI 42.23 kg/m   Objective:   Physical Exam Vitals and nursing note reviewed.  Constitutional:      Appearance: Normal appearance.  Cardiovascular:     Rate and Rhythm: Normal rate and regular rhythm.     Heart sounds: Normal heart sounds.  Pulmonary:     Effort: Pulmonary effort is normal.     Breath sounds: Normal breath sounds.  Genitourinary:    Comments: Deferred. Skin:    General: Skin is warm and dry.  Neurological:     Mental Status: She is alert and oriented to person, place, and time.  Psychiatric:        Mood and Affect: Mood normal.        Behavior: Behavior  normal.        Thought Content: Thought content normal.        Judgment: Judgment normal.      Assessment and Plan   1. Yeast infection - fluconazole (DIFLUCAN) 150 MG tablet; Take 1 tablet (150 mg total) by mouth once for 1 dose. If symptoms persist, may repeat in 3 days.  Dispense: 2 tablet; Refill: 0   It is likely that the scrub that she started using after her [place] wax is the cause of her yeast infection.  She will use Diflucan 1 tablet.  If her symptoms persist, she may repeat 1 tablet in 3 days.  We will consider not using the scrub.   Agrees with plan of  care discussed today. Understands warning signs to seek further care: Fever, urinary symptoms, flank pain. Understands to follow-up if symptoms do not improve, worsen, or return.   Pecolia Ades, FNP-C 12/01/2019

## 2019-12-01 NOTE — Patient Instructions (Signed)
Vaginal Yeast Infection, Adult  Vaginal yeast infection is a condition that causes vaginal discharge as well as soreness, swelling, and redness (inflammation) of the vagina. This is a common condition. Some women get this infection frequently. What are the causes? This condition is caused by a change in the normal balance of the yeast (candida) and bacteria that live in the vagina. This change causes an overgrowth of yeast, which causes the inflammation. What increases the risk? The condition is more likely to develop in women who:  Take antibiotic medicines.  Have diabetes.  Take birth control pills.  Are pregnant.  Douche often.  Have a weak body defense system (immune system).  Have been taking steroid medicines for a long time.  Frequently wear tight clothing. What are the signs or symptoms? Symptoms of this condition include:  White, thick, creamy vaginal discharge.  Swelling, itching, redness, and irritation of the vagina. The lips of the vagina (vulva) may be affected as well.  Pain or a burning feeling while urinating.  Pain during sex. How is this diagnosed? This condition is diagnosed based on:  Your medical history.  A physical exam.  A pelvic exam. Your health care provider will examine a sample of your vaginal discharge under a microscope. Your health care provider may send this sample for testing to confirm the diagnosis. How is this treated? This condition is treated with medicine. Medicines may be over-the-counter or prescription. You may be told to use one or more of the following:  Medicine that is taken by mouth (orally).  Medicine that is applied as a cream (topically).  Medicine that is inserted directly into the vagina (suppository). Follow these instructions at home:  Lifestyle  Do not have sex until your health care provider approves. Tell your sex partner that you have a yeast infection. That person should go to his or her health care  provider and ask if they should also be treated.  Do not wear tight clothes, such as pantyhose or tight pants.  Wear breathable cotton underwear. General instructions  Take or apply over-the-counter and prescription medicines only as told by your health care provider.  Eat more yogurt. This may help to keep your yeast infection from returning.  Do not use tampons until your health care provider approves.  Try taking a sitz bath to help with discomfort. This is a warm water bath that is taken while you are sitting down. The water should only come up to your hips and should cover your buttocks. Do this 3-4 times per day or as told by your health care provider.  Do not douche.  If you have diabetes, keep your blood sugar levels under control.  Keep all follow-up visits as told by your health care provider. This is important. Contact a health care provider if:  You have a fever.  Your symptoms go away and then return.  Your symptoms do not get better with treatment.  Your symptoms get worse.  You have new symptoms.  You develop blisters in or around your vagina.  You have blood coming from your vagina and it is not your menstrual period.  You develop pain in your abdomen. Summary  Vaginal yeast infection is a condition that causes discharge as well as soreness, swelling, and redness (inflammation) of the vagina.  This condition is treated with medicine. Medicines may be over-the-counter or prescription.  Take or apply over-the-counter and prescription medicines only as told by your health care provider.  Do not douche.   Do not have sex or use tampons until your health care provider approves.  Contact a health care provider if your symptoms do not get better with treatment or your symptoms go away and then return. This information is not intended to replace advice given to you by your health care provider. Make sure you discuss any questions you have with your health care  provider. Document Revised: 08/23/2018 Document Reviewed: 06/11/2017 Elsevier Patient Education  2020 [employer]  

## 2019-12-02 ENCOUNTER — Encounter (HOSPITAL_COMMUNITY): Payer: Self-pay

## 2019-12-02 ENCOUNTER — Ambulatory Visit (HOSPITAL_COMMUNITY)
Admission: RE | Admit: 2019-12-02 | Discharge: 2019-12-02 | Disposition: A | Discharge status: Home / Self Care | Payer: Medicaid Other | Source: Ambulatory Visit | Attending: Family Medicine | Admitting: Family Medicine

## 2019-12-02 ENCOUNTER — Other Ambulatory Visit (HOSPITAL_COMMUNITY): Payer: Self-pay | Admitting: Family Medicine

## 2019-12-02 DIAGNOSIS — R928 Other abnormal and inconclusive findings on diagnostic imaging of breast: Secondary | ICD-10-CM

## 2019-12-02 DIAGNOSIS — N6322 Unspecified lump in the left breast, upper inner quadrant: Secondary | ICD-10-CM | POA: Diagnosis not present

## 2019-12-02 HISTORY — PX: BREAST BIOPSY: SHX20

## 2019-12-02 IMAGING — MG MM BREAST LOCALIZATION CLIP
4 series · 4 of 12 positions shown · non-contrast
Comparison: Previous exam(s).
COMPARISON: Previous exam(s).

Addendum:
CLINICAL DATA: Status post ultrasound-guided core needle biopsy of
a 5 mm mass in the 9:30 o'clock position breast.

EXAM:
DIAGNOSTIC LEFT MAMMOGRAM POST ULTRASOUND BIOPSY

[L CC synth-2D]
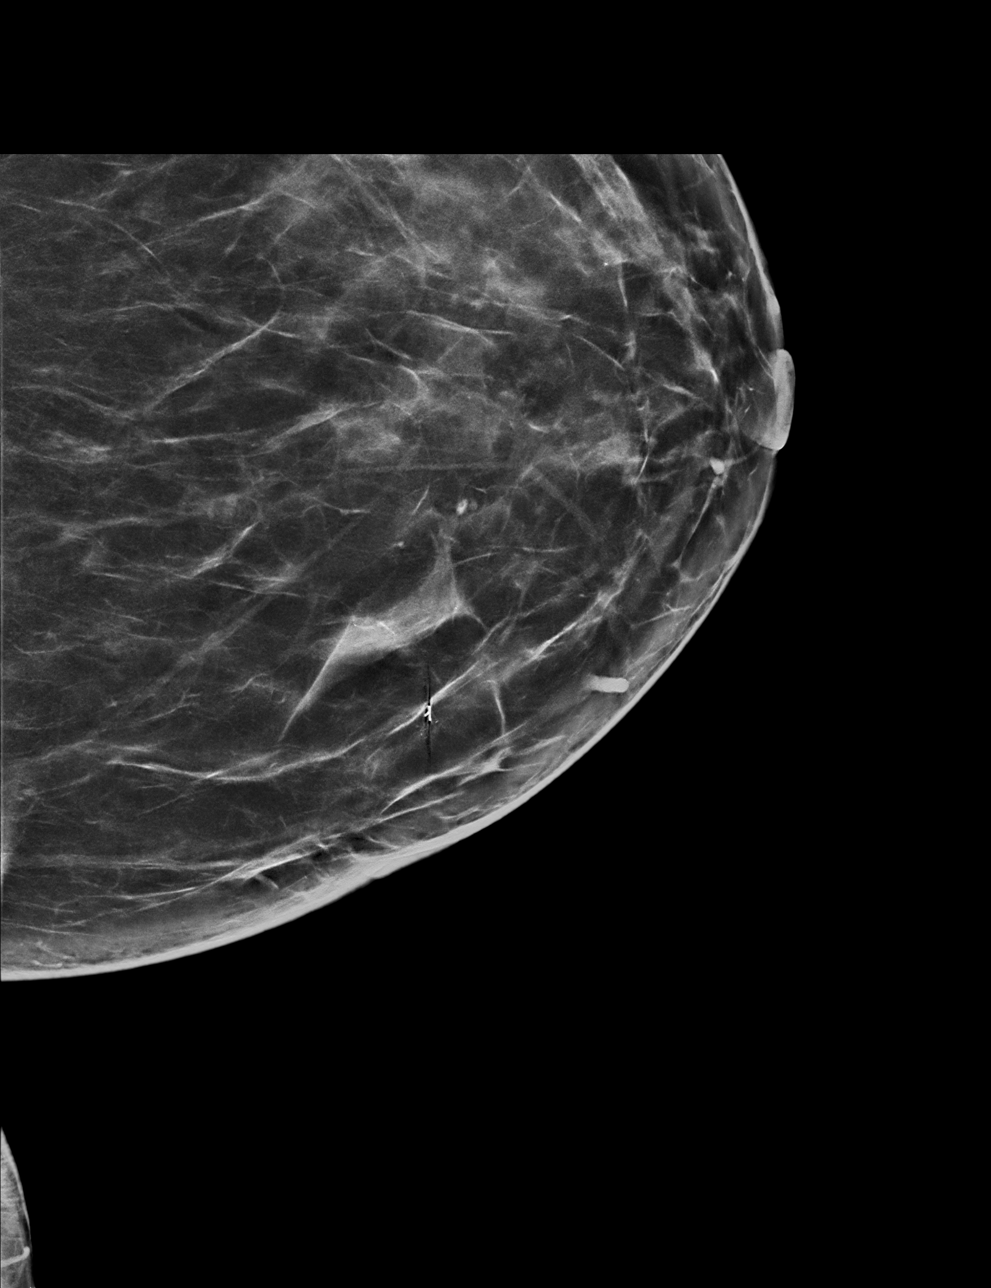

[L ML synth-2D]
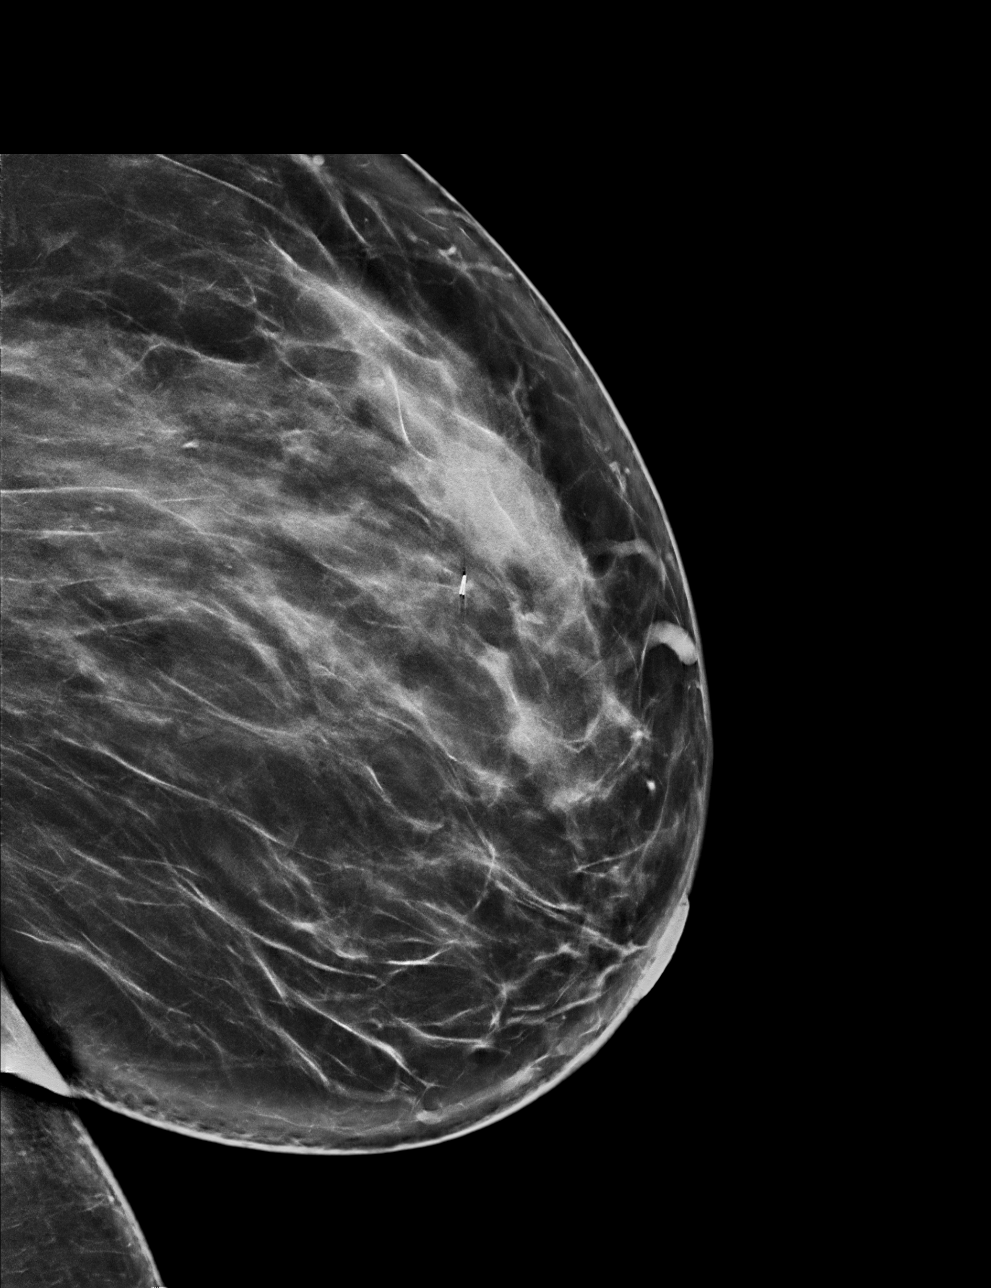

[L ML tomo · tomo slice 43/85.0]
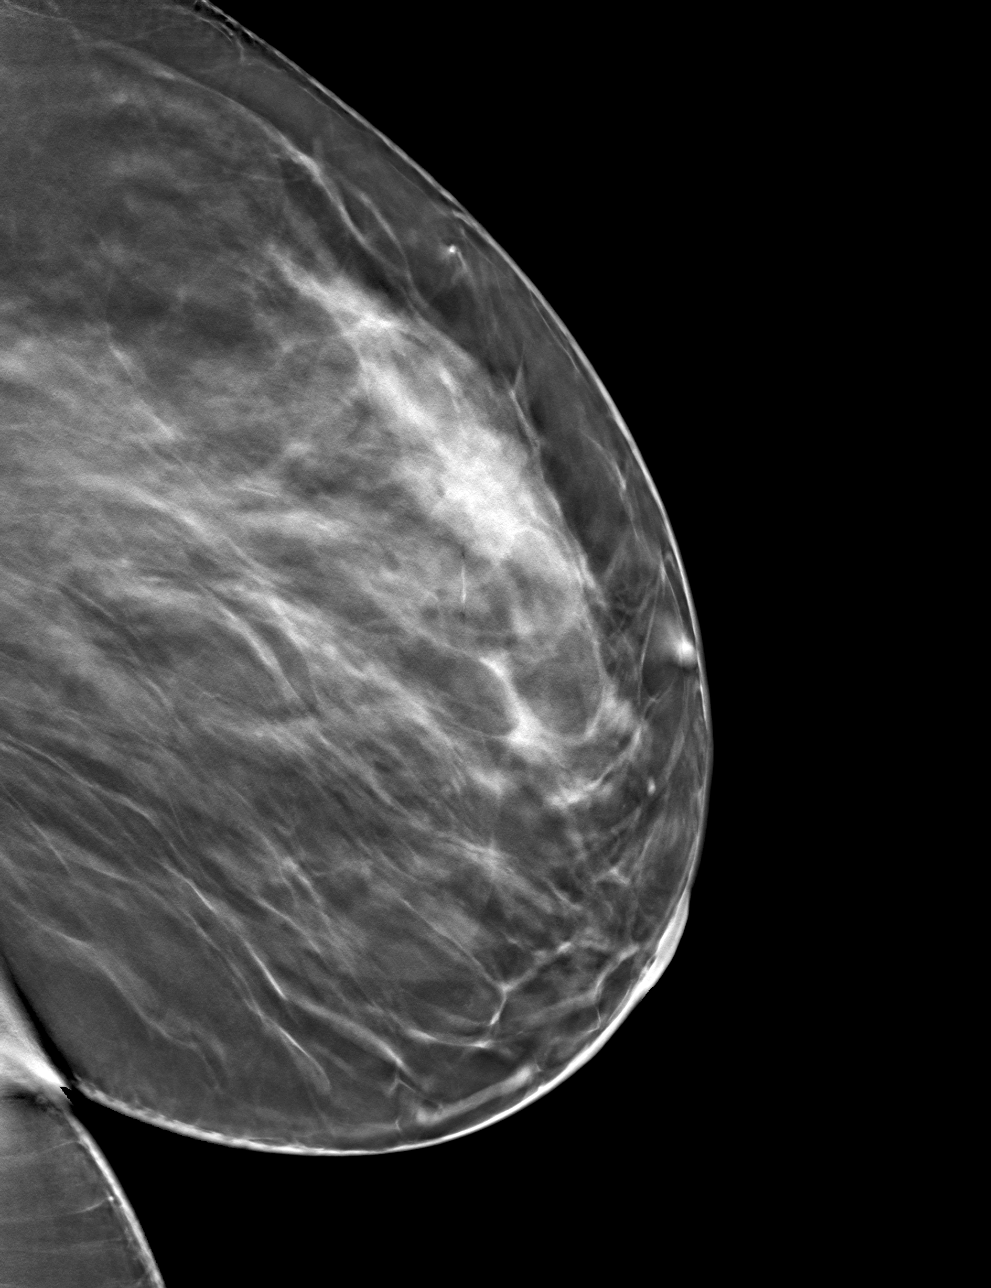

[L CC tomo · tomo slice 39/77.0]
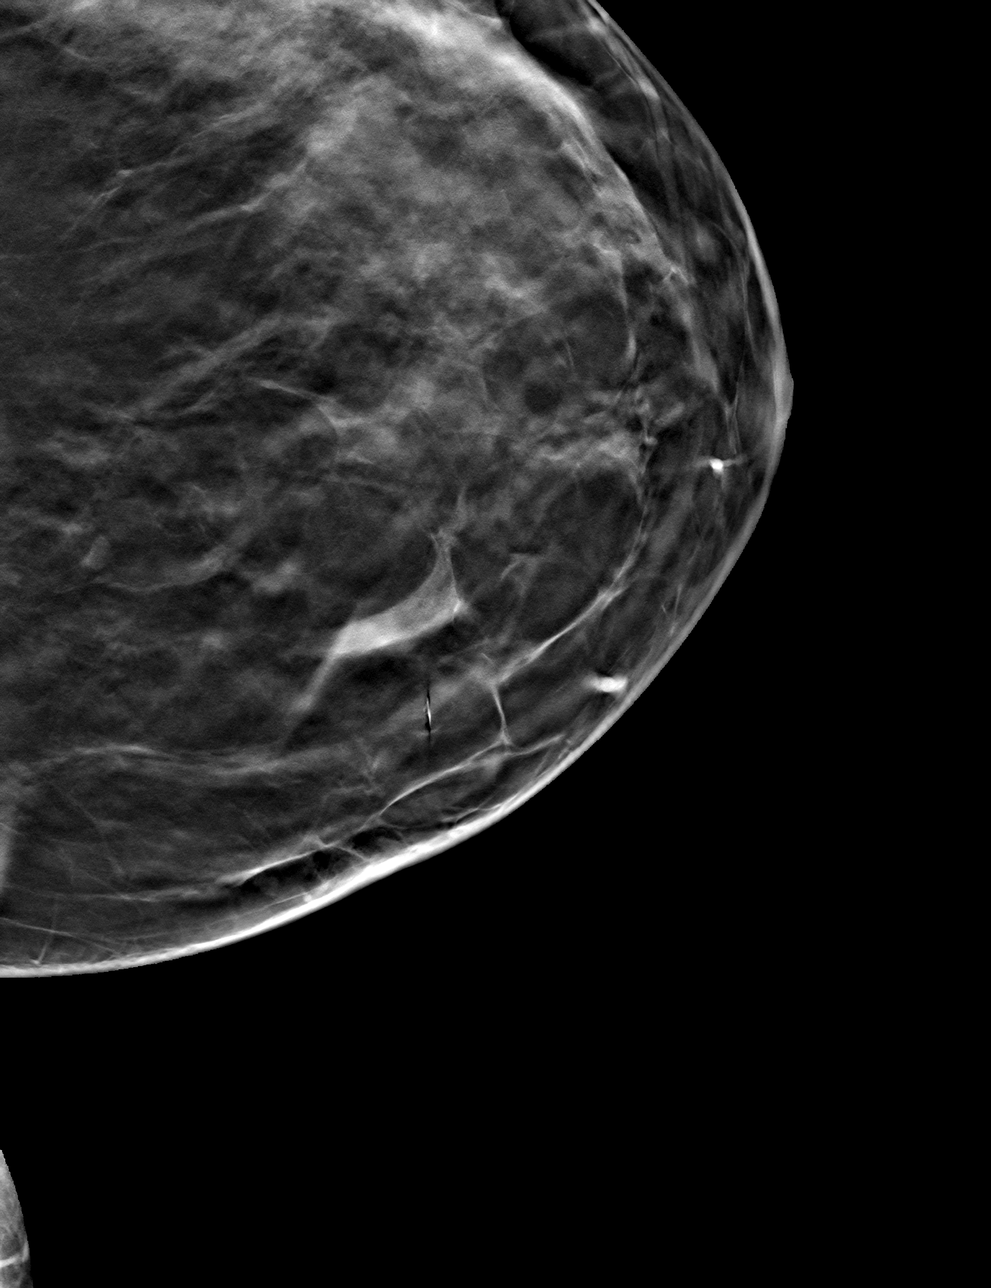

[4 of 12 positions shown; findings below may reference images not displayed]

FINDINGS: Mammographic images were obtained following ultrasound guided biopsy
of the recently demonstrated 5 mass in the 9:30 o'clock position the
left breast. The biopsy marking clip is in expected position at the
site of biopsy. This is at the location the 4 mm group of
calcifications demonstrated the recent mammogram.
IMPRESSION: Appropriate positioning of the ribbon shaped biopsy marking clip at
the site of biopsy in the 9:30 o'clock position of the left breast.
This corresponds to the location of the recently demonstrated 4 mm
group of calcifications in the upper inner quadrant of the breast.
Therefore, these do not need a separate stereotactic guided core
needle biopsy.

Final Assessment: Post Procedure Mammograms for Marker Placement

ADDENDUM:
The previously evaluated probably benign mammographic asymmetry in
the medial left breast does not correspond to the area biopsied
today. Today, this has an appearance compatible with normal
fibroglandular tissue, unchanged since [DATE]. Therefore, this
does not need further follow-up.

*** End of Addendum ***
FINDINGS: Mammographic images were obtained following ultrasound guided biopsy
of the recently demonstrated 5 mass in the 9:30 o'clock position the
left breast. The biopsy marking clip is in expected position at the
site of biopsy. This is at the location the 4 mm group of
calcifications demonstrated the recent mammogram.
IMPRESSION: Appropriate positioning of the ribbon shaped biopsy marking clip at
the site of biopsy in the 9:30 o'clock position of the left breast.
This corresponds to the location of the recently demonstrated 4 mm
group of calcifications in the upper inner quadrant of the breast.
Therefore, these do not need a separate stereotactic guided core
needle biopsy.

Final Assessment: Post Procedure Mammograms for Marker Placement

## 2019-12-02 IMAGING — US US BREAST BX W LOC DEV 1ST LESION IMG BX SPEC US GUIDE*L*
1 series · 12 of 14 positions shown · non-contrast
Comparison: Previous exam(s).
COMPARISON: Previous exam(s).

Addendum:
CLINICAL DATA: 5 mm mass, possibly containing calcifications, in
the 9:30 o'clock position of the left breast at recent ultrasound.

EXAM:
ULTRASOUND GUIDED LEFT BREAST CORE NEEDLE BIOPSY

[Series 1: us breast bx w loc dev 1st lesion img bx spec us g · 0.07mm/px · 12 of 14 slices shown]
[im 1/14]
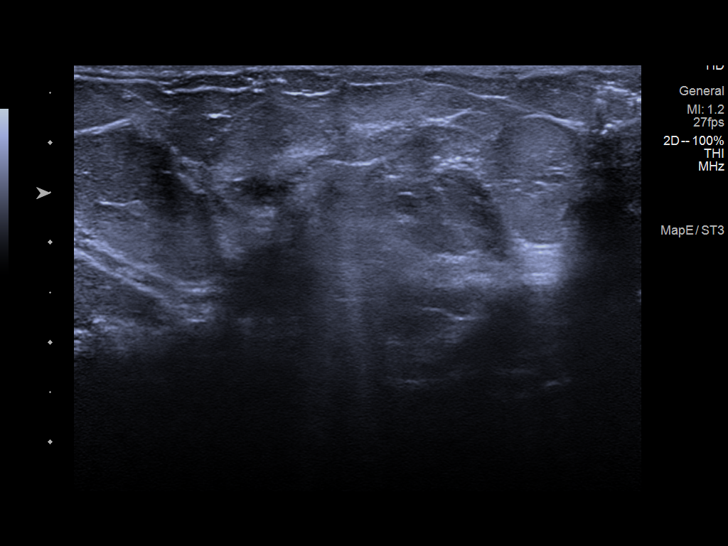
[im 2/14]
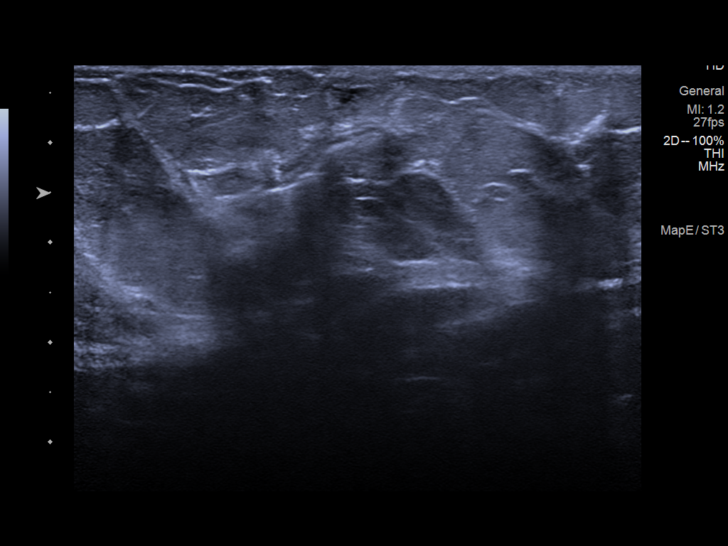
[im 3/14]
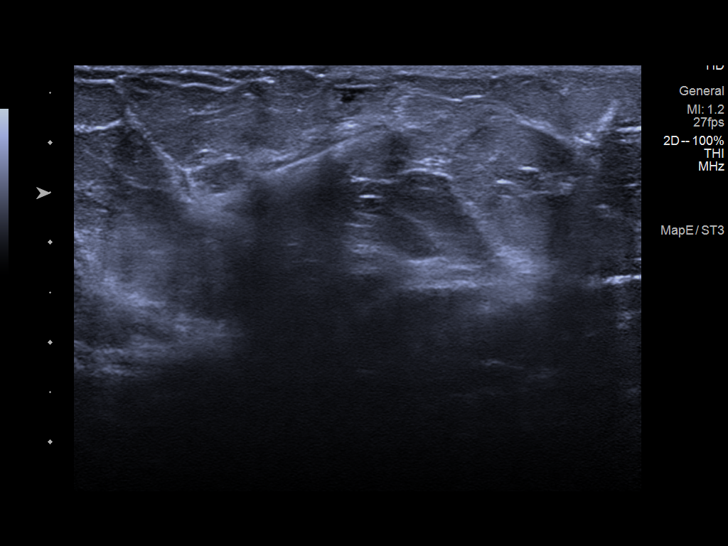
[im 5/14]
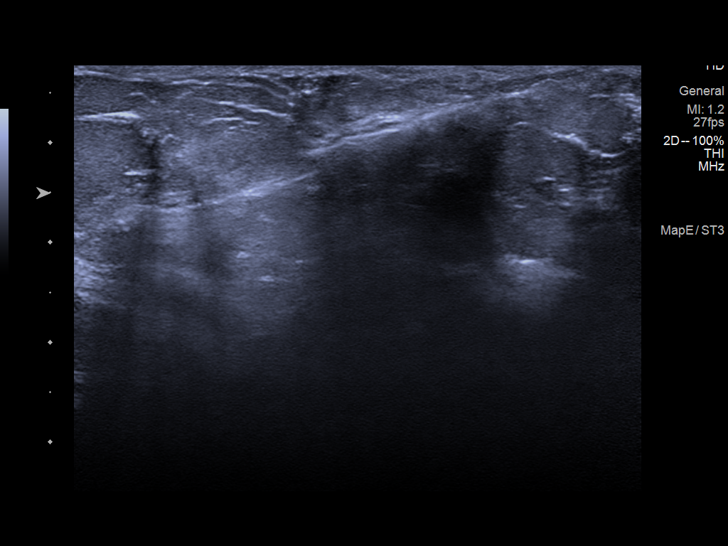
[im 6/14]
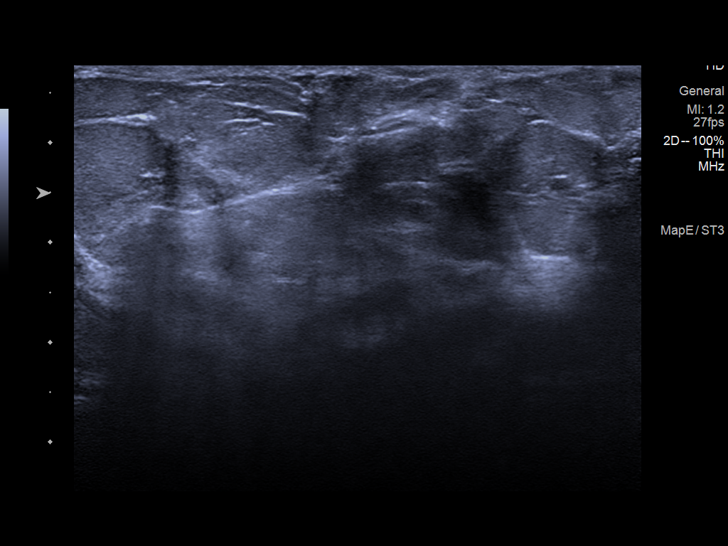
[im 7/14]
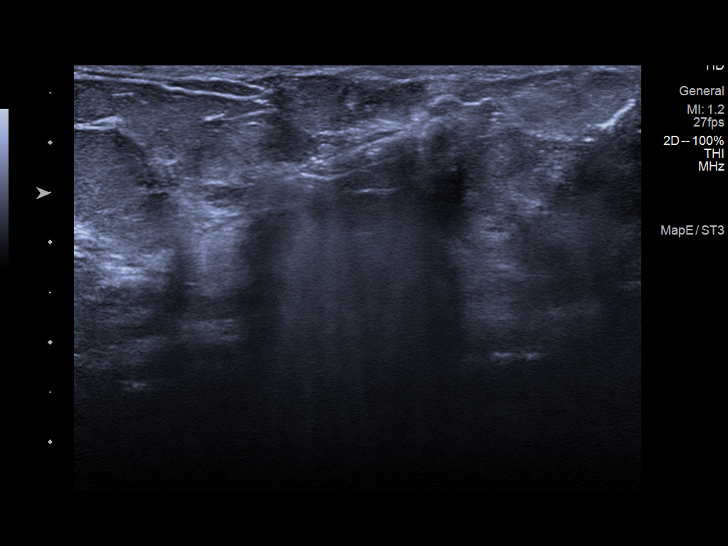
[im 8/14]
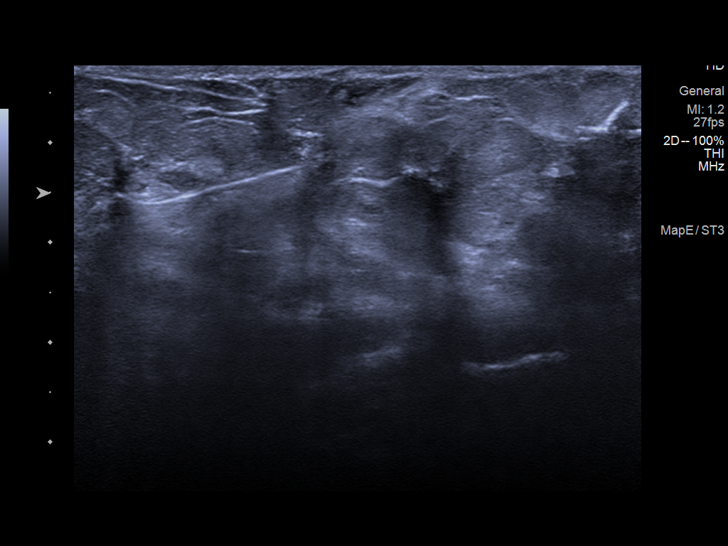
[im 9/14]
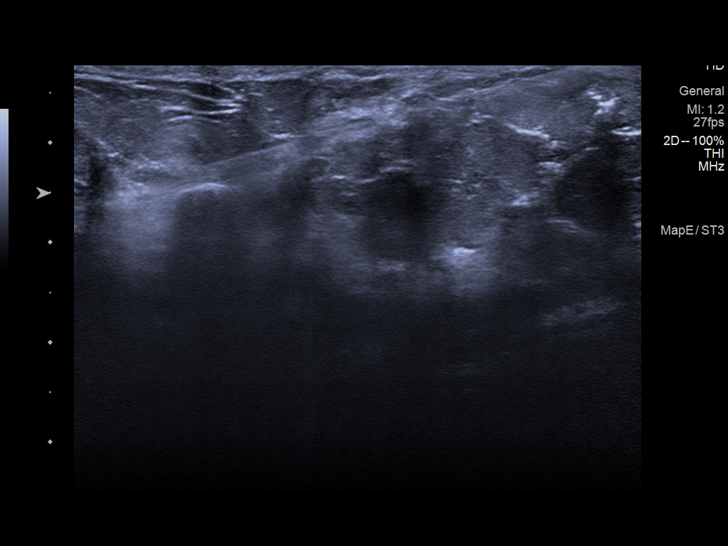
[im 10/14]
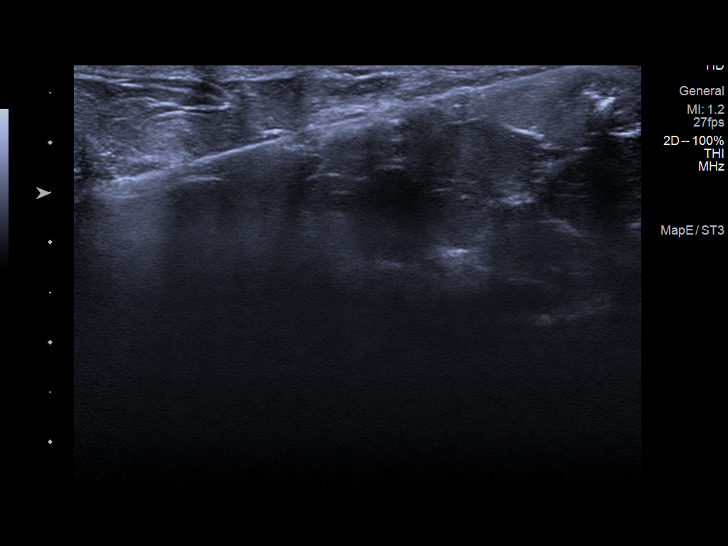
[im 12/14]
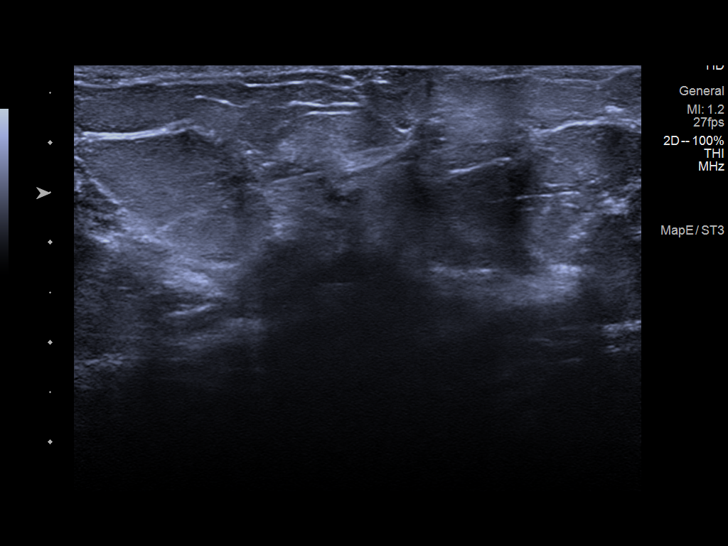
[im 13/14]
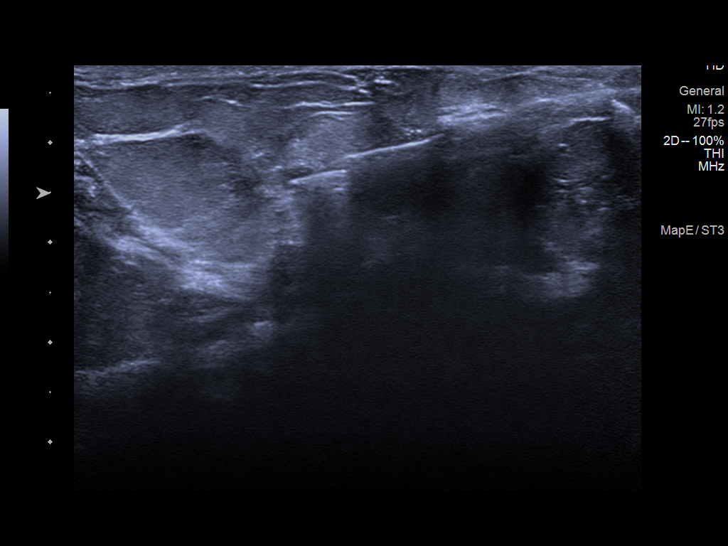
[im 14/14]
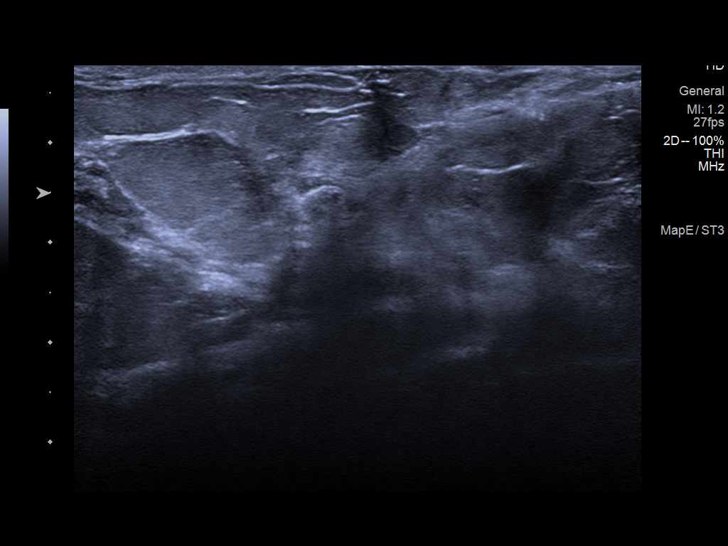

[12 of 14 positions shown; findings below may reference images not displayed]



Lesion quadrant: Upper inner quadrant

Using sterile technique and 1% Lidocaine as local anesthetic, under
direct ultrasound visualization, a 12 gauge RAACH device was
used to perform biopsy of the recently demonstrated 5 mm mass in the
9:30 o'clock position of the left breast, 6 cm from the nipple,
using an inferomedial approach. At the conclusion of the procedure a
ribbon shaped tissue marker clip was deployed into the biopsy
cavity. Follow up 2 view mammogram was performed and dictated
separately.
IMPRESSION: Ultrasound guided biopsy of the recently demonstrated 5 mm mass in
the 9:30 o'clock position of the left breast. No apparent
complications.

ADDENDUM:
PATHOLOGY revealed: A. BREAST, [DATE], LEFT, BIOPSY: - Fat necrosis
with calcifications. - No malignancy identified.

Pathology results are CONCORDANT with imaging findings, per Dr.
RAACH.

Pathology results and recommendations below were discussed with
patient by telephone on [DATE]. Patient reported biopsy site
within normal limits with slight tenderness at the site. Post biopsy
care instructions were reviewed, questions were answered and my
direct phone number was provided to patient. Patient was instructed
to call [HOSPITAL] [HOSPITAL] Mammography Department if
any concerns or questions arise related to the biopsy.

Recommendation: Annual screening mammography beginning at age 40.

Pathology results reported by RAACH RN on [DATE].



Lesion quadrant: Upper inner quadrant

Using sterile technique and 1% Lidocaine as local anesthetic, under
direct ultrasound visualization, a 12 gauge RAACH device was
used to perform biopsy of the recently demonstrated 5 mm mass in the
9:30 o'clock position of the left breast, 6 cm from the nipple,
using an inferomedial approach. At the conclusion of the procedure a
ribbon shaped tissue marker clip was deployed into the biopsy
cavity. Follow up 2 view mammogram was performed and dictated
separately.
IMPRESSION: Ultrasound guided biopsy of the recently demonstrated 5 mm mass in
the 9:30 o'clock position of the left breast. No apparent
complications.

## 2019-12-02 MED ORDER — LIDOCAINE HCL (PF) 2 % IJ SOLN
INTRAMUSCULAR | Status: AC
  Administered 2019-12-02: 10 mL
  Filled 2019-12-02: qty 10

## 2019-12-02 MED ORDER — LIDOCAINE-EPINEPHRINE (PF) 1 %-1:200000 IJ SOLN
INTRAMUSCULAR | Status: AC
  Administered 2019-12-02: 30 mL
  Filled 2019-12-02: qty 30

## 2019-12-02 NOTE — Discharge Instructions (Signed)
Breast Biopsy, Care After These instructions give you information about caring for yourself after your procedure. Your doctor may also give you more specific instructions. Call your doctor if you have any problems or questions after your procedure. What can I expect after the procedure? After your procedure, it is common to have:  Bruising on your breast.  Numbness, tingling, or pain near your biopsy site. Follow these instructions at home: Medicines  Take over-the-counter and prescription medicines only as told by your doctor.  Do not drive for 24 hours if you were given a medicine to help you relax (sedative) during your procedure.  Do not drink alcohol while taking pain medicine.  Do not drive or use heavy machinery while taking prescription pain medicine. Biopsy site care      Follow instructions from your doctor about how to take care of your cut from surgery (incision) or your puncture area. Make sure you: ? Wash your hands with soap and water before you change your bandage (dressing). If you cannot use soap and water, use hand sanitizer. ? Change your bandage as told by your doctor. ? Leave stitches (sutures), skin glue, or skin tape (adhesive strips) in place. They may need to stay in place for 2 weeks or longer. If tape strips get loose and curl up, you may trim the loose edges. Do not remove tape strips completely unless your doctor says it is okay.  If you have stitches, keep them dry when you take a bath or a shower.  Check your cut or puncture area every day for signs of infection. Check for: ? Redness, swelling, or pain. ? Fluid or blood. ? Warmth. ? Pus or a bad smell.  Protect the biopsy area. Do not let the area get bumped. Activity  If you had a cut during your procedure, avoid activities that could pull your cut open. These include: ? Stretching. ? Reaching over your head. ? Exercise. ? Sports. ? Lifting anything that weighs more than 3 lb (1.4  kg).  Return to your normal activities as told by your doctor. Ask your doctor what activities are safe for you. Managing pain, stiffness, and swelling If told, put ice on the biopsy site to relieve swelling:  Put ice in a plastic bag.  Place a towel between your skin and the bag.  Leave the ice on for 20 minutes, 2-3 times a day. General instructions  Continue your normal diet.  Wear a good support bra for as long as told by your doctor.  Get checked for extra fluid around your lymph nodes (lymphedema) as often as told by your doctor.  Keep all follow-up visits as told by your doctor. This is important. Contact a doctor if:  You notice any of the following at the biopsy site: ? More redness, swelling, or pain. ? More fluid or blood coming from the site. ? The site feels warm to the touch. ? Pus or a bad smell coming from the site. ? The site breaks open after the stitches or skin tape strips have been removed.  You have a rash.  You have a fever. Get help right away if:  You have more bleeding from the biopsy site. Get help right away if bleeding is more than a small spot.  You have trouble breathing.  You have red streaks around the biopsy site. Summary  After your procedure, it is common to have bruising, numbness, tingling, or pain near the biopsy site.  Do not drive   or use heavy machinery while taking prescription pain medicine.  Wear a good support bra for as long as told by your doctor.  If you had a cut during your procedure, avoid activities that may pull the cut open. Ask your doctor what activities are safe for you. This information is not intended to replace advice given to you by your health care provider. Make sure you discuss any questions you have with your health care provider. Document Revised: 07/12/2017 Document Reviewed: 07/12/2017 Elsevier Patient Education  2020 [employer] Inc[employer]  

## 2019-12-02 NOTE — Sedation Documentation (Signed)
PT tolerated left breast biopsy well today with NAD noted. PT verbalized understanding of discharge instructions. PT ambulated to mammogram area at this time.

## 2019-12-03 LAB — SURGICAL PATHOLOGY

## 2019-12-04 ENCOUNTER — Other Ambulatory Visit: Payer: Self-pay

## 2019-12-04 ENCOUNTER — Ambulatory Visit (HOSPITAL_COMMUNITY)
Admission: RE | Admit: 2019-12-04 | Discharge: 2019-12-04 | Disposition: A | Discharge status: Home / Self Care | Payer: Medicaid Other | Source: Ambulatory Visit | Attending: "Endocrinology | Admitting: "Endocrinology

## 2019-12-04 DIAGNOSIS — E210 Primary hyperparathyroidism: Secondary | ICD-10-CM | POA: Insufficient documentation

## 2019-12-04 DIAGNOSIS — E213 Hyperparathyroidism, unspecified: Secondary | ICD-10-CM | POA: Diagnosis not present

## 2019-12-04 DIAGNOSIS — M85852 Other specified disorders of bone density and structure, left thigh: Secondary | ICD-10-CM | POA: Diagnosis not present

## 2019-12-04 DIAGNOSIS — Z13820 Encounter for screening for osteoporosis: Secondary | ICD-10-CM | POA: Diagnosis not present

## 2020-01-25 ENCOUNTER — Other Ambulatory Visit: Payer: Self-pay | Admitting: "Endocrinology

## 2020-01-26 NOTE — Telephone Encounter (Signed)
Please advise 

## 2020-02-03 DIAGNOSIS — E210 Primary hyperparathyroidism: Secondary | ICD-10-CM | POA: Diagnosis not present

## 2020-02-05 ENCOUNTER — Other Ambulatory Visit: Payer: Self-pay | Admitting: Surgery

## 2020-02-05 DIAGNOSIS — E210 Primary hyperparathyroidism: Secondary | ICD-10-CM

## 2020-02-10 ENCOUNTER — Other Ambulatory Visit (HOSPITAL_COMMUNITY): Payer: Self-pay | Admitting: Surgery

## 2020-02-10 DIAGNOSIS — E210 Primary hyperparathyroidism: Secondary | ICD-10-CM

## 2020-02-11 ENCOUNTER — Ambulatory Visit: Payer: Medicaid Other | Admitting: "Endocrinology

## 2020-02-20 ENCOUNTER — Other Ambulatory Visit: Payer: Medicaid Other

## 2020-02-20 ENCOUNTER — Encounter (HOSPITAL_COMMUNITY): Payer: Self-pay

## 2020-02-20 ENCOUNTER — Encounter (HOSPITAL_COMMUNITY)
Admission: RE | Admit: 2020-02-20 | Discharge: 2020-02-20 | Disposition: A | Discharge status: Home / Self Care | Payer: Medicaid Other | Source: Ambulatory Visit | Attending: Surgery | Admitting: Surgery

## 2020-02-20 ENCOUNTER — Ambulatory Visit (HOSPITAL_COMMUNITY)
Admission: RE | Admit: 2020-02-20 | Discharge: 2020-02-20 | Disposition: A | Discharge status: Home / Self Care | Payer: Medicaid Other | Source: Ambulatory Visit | Attending: Surgery | Admitting: Surgery

## 2020-02-20 ENCOUNTER — Other Ambulatory Visit: Payer: Self-pay

## 2020-02-20 DIAGNOSIS — E210 Primary hyperparathyroidism: Secondary | ICD-10-CM | POA: Insufficient documentation

## 2020-02-20 DIAGNOSIS — D351 Benign neoplasm of parathyroid gland: Secondary | ICD-10-CM | POA: Diagnosis not present

## 2020-02-20 IMAGING — NM NM PARATHYROID W/ SPECT
6 series · 16 of 16 positions shown · non-contrast
Comparison: None.

CLINICAL DATA: Evaluate for parathyroid adenoma.

EXAM:
NM PARATHYROID SCINTIGRAPHY AND SPECT IMAGING
TECHNIQUE: Following intravenous administration of radiopharmaceutical, early
and 2-hour delayed planar images were obtained in the anterior
projection. Delayed triplanar SPECT images were also obtained at 2
hours.
RADIOPHARMACEUTICALS:  25.8 mCi [VV] Sestamibi IV

[Series 1: 15 min ant · 2.37mm/px · 1 of 1 slices shown]
[im 1/1]
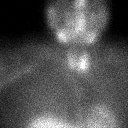

[Series 1: wbr_bone_40 15 min ant · 2.37mm/px · 1 of 1 slices shown]
[im 1/1]
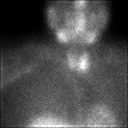

[Series 2: 2 hr ant · 2.37mm/px · 1 of 1 slices shown]
[im 1/1]
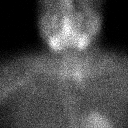

[Series 2: wbr_bone_40 2 hr ant · 2.37mm/px · 1 of 1 slices shown]
[im 1/1]
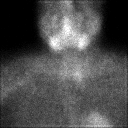

[Series 3: wbr_bone spect parathyroid · 2.4mm · 2.37mm/px · 6 of 128 frames shown]
[frame 11/128]
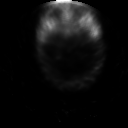
[frame 32/128]
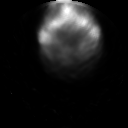
[frame 54/128]
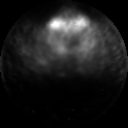
[frame 75/128]
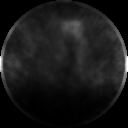
[frame 96/128]
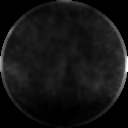
[frame 118/128]
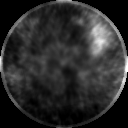

[Series 3: spect parathyroid · 2.37mm/px · 6 of 64 frames shown]
[frame 6/64  full-range]
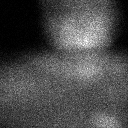
[frame 16/64  full-range]
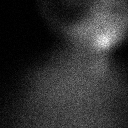
[frame 27/64  full-range]
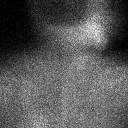
[frame 38/64  full-range]
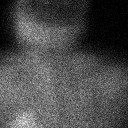
[frame 48/64  full-range]
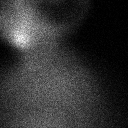
[frame 59/64  full-range]
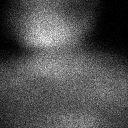

[16 of 16 positions shown; findings below may reference images not displayed]

FINDINGS: On the early images there is radiotracer uptake within both lobes of
thyroid gland. A more focal area of intense uptake localizes to the
inferior pole of the left lobe. On the 2 hour delayed images there
is washout of the radiopharmaceutical from both lobes of thyroid
gland. Mild persistent uptake localizing to the inferior pole of the
left lobe is noted.
IMPRESSION: 1. Mild persistent asymmetric increased uptake localizing to the
area around the inferior pole of left lobe of thyroid gland may
reflect underlying suspected parathyroid adenoma. Further imaging
with contrast enhanced MR of the soft tissues of neck may be
helpful.

## 2020-02-20 IMAGING — US US THYROID
1 series · 13 of 25 positions shown · non-contrast
Comparison: None.

CLINICAL DATA: 39-year-old female with primary hyperparathyroidism.

EXAM:
THYROID ULTRASOUND
TECHNIQUE: Ultrasound examination of the thyroid gland and adjacent soft
tissues was performed.

[Series 1: us thyroid · 13 of 56 slices shown]
[im 1/56]
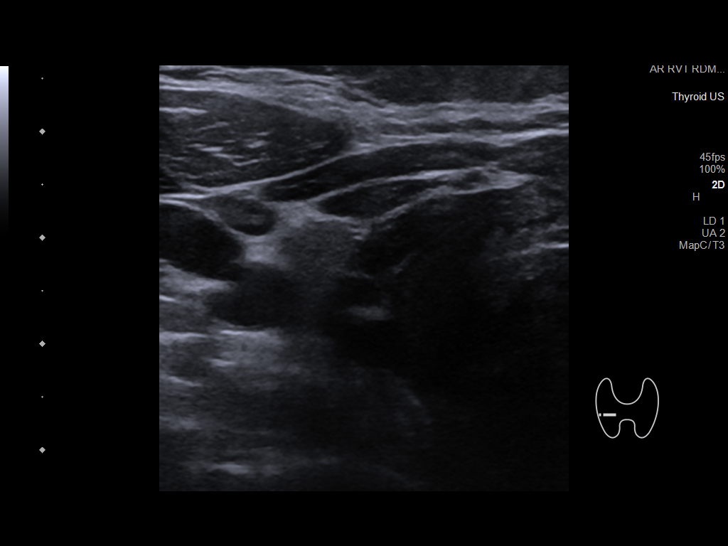
[im 5/56]
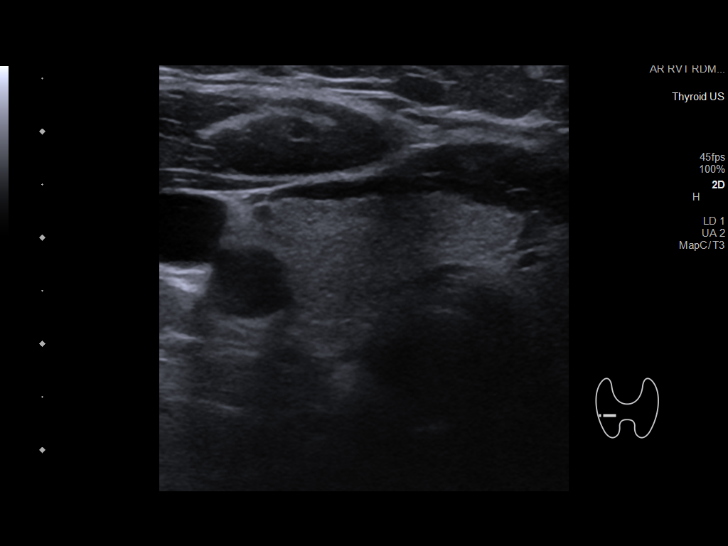
[im 10/56]
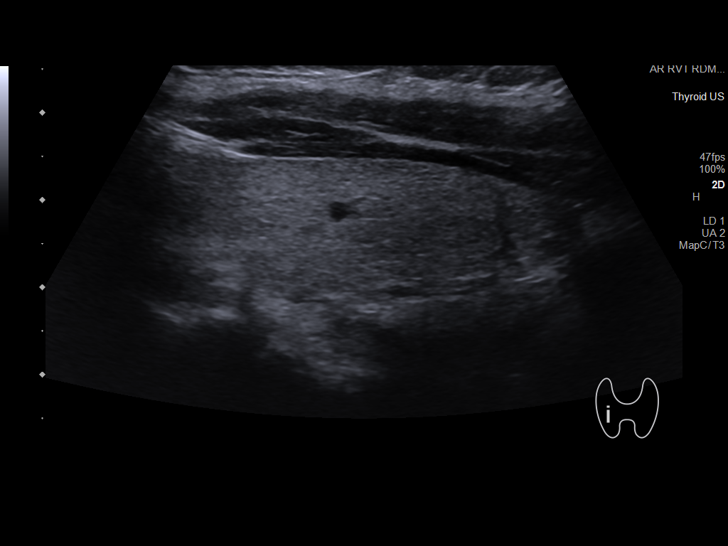
[im 14/56]
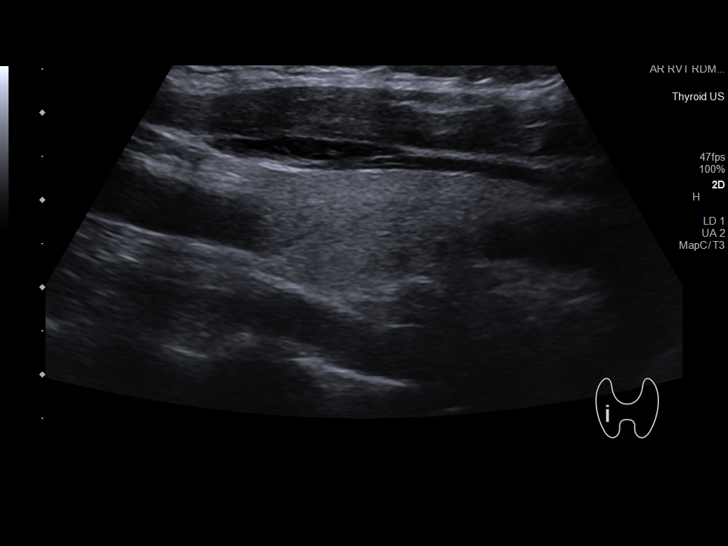
[im 19/56]
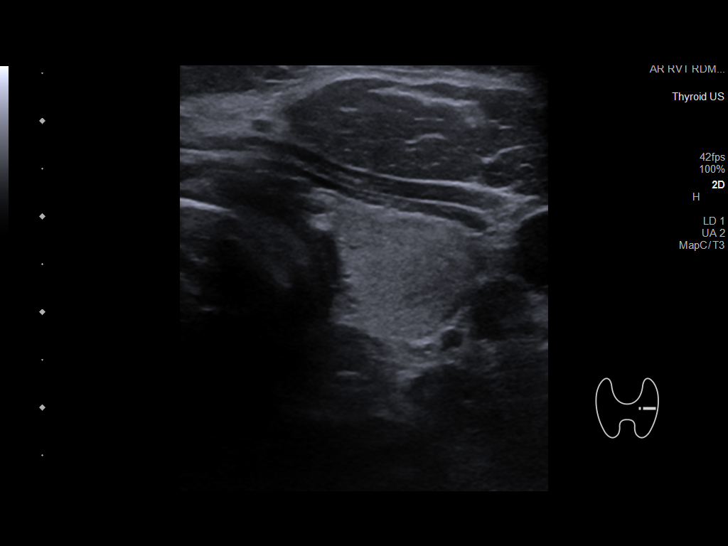
[im 23/56]
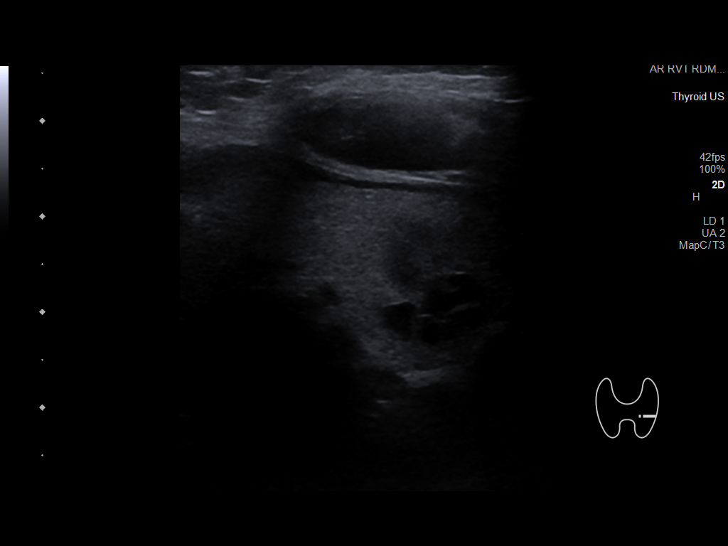
[im 28/56]
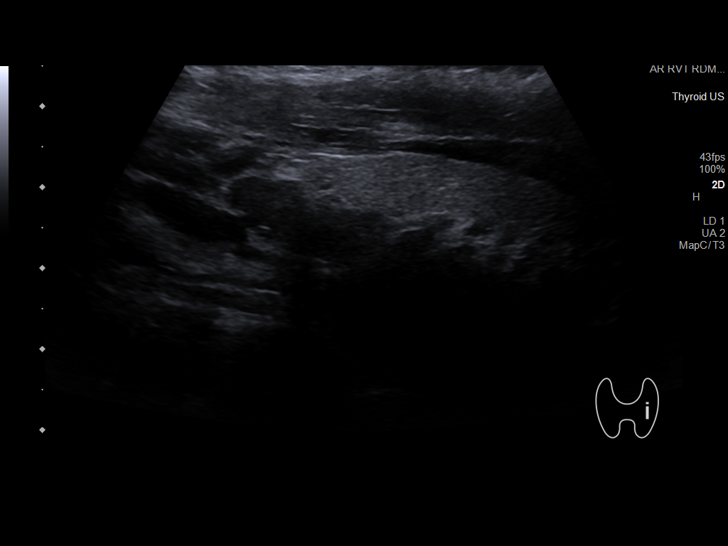
[im 33/56]
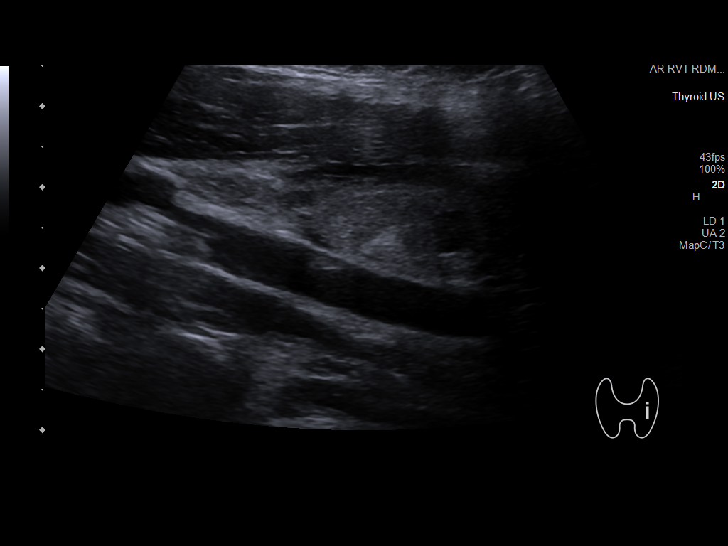
[im 37/56]
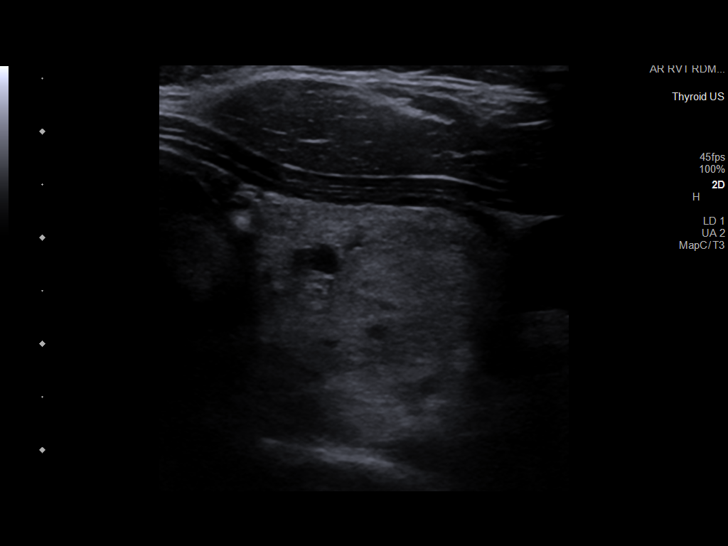
[im 42/56]
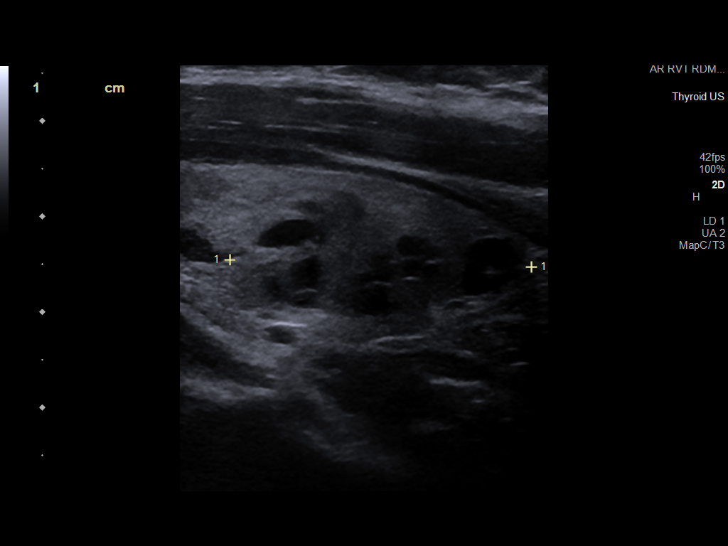
[im 46/56]
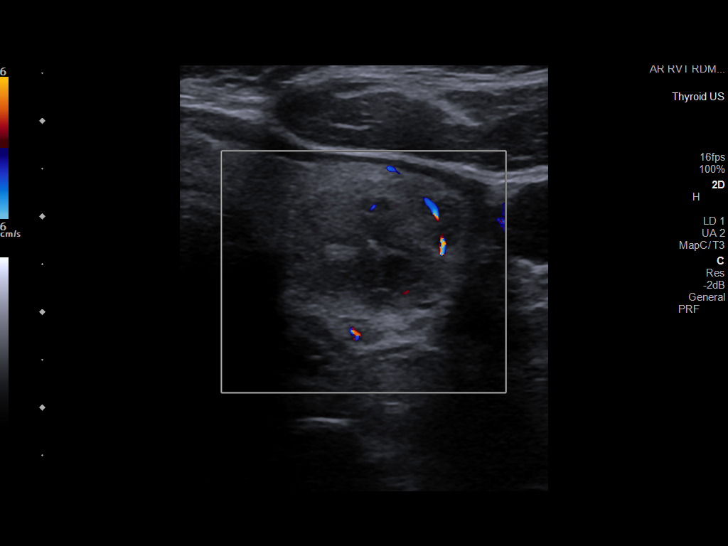
[im 51/56]
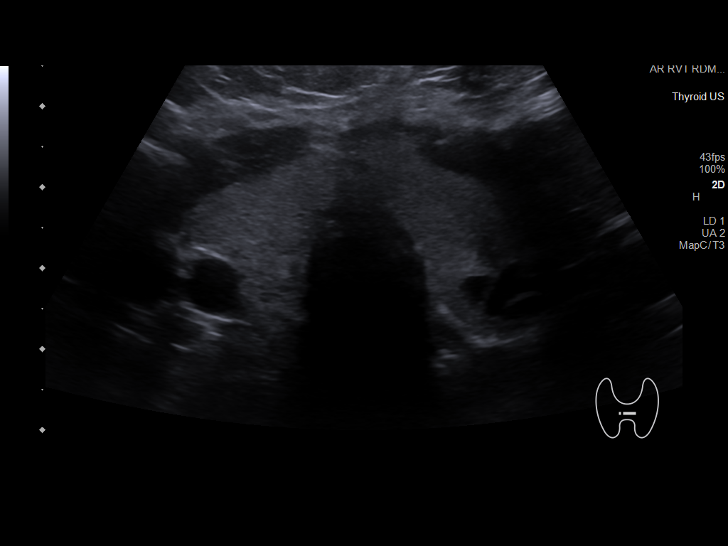
[im 56/56]
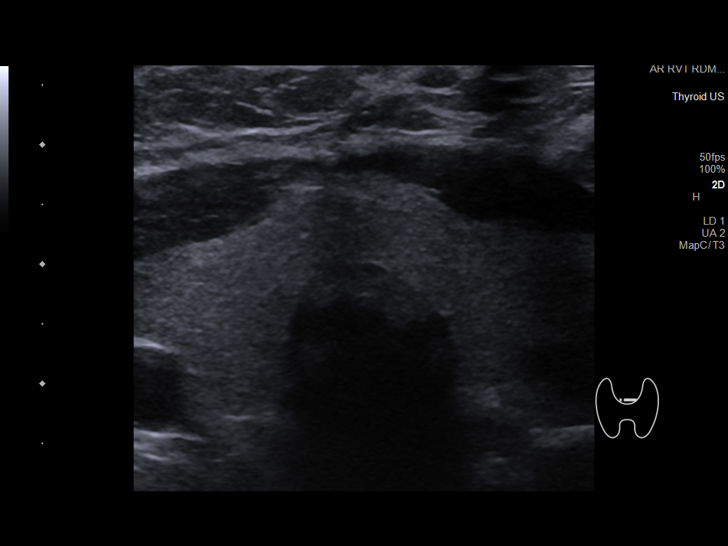

[13 of 25 positions shown; findings below may reference images not displayed]

FINDINGS: Parenchymal Echotexture: Mildly heterogenous

Isthmus: 0.8 cm

Right lobe: 5.1 x 2.5 x 1.5 cm

Left lobe: 5.8 x 2.5 x 2.3 cm

_________________________________________________________

Estimated total number of nodules >/= 1 cm: 2

Number of spongiform nodules >/=  2 cm not described below (TR1): 0

Number of mixed cystic and solid nodules >/= 1.5 cm not described
below (TR2): 0

_________________________________________________________

Nodule # 1:

Location: Left; Superior

Maximum size: 1.1 cm; Other 2 dimensions: 0.7 x 0.6 cm

Composition: spongiform (0)

Echogenicity: hypoechoic (2)

Shape: not taller-than-wide (0)

Margins: ill-defined (0)

Echogenic foci: none (0)

ACR TI-RADS total points: 2.

ACR TI-RADS risk category: TR2 (2 points).

ACR TI-RADS recommendations:

This nodule does NOT meet TI-RADS criteria for biopsy or dedicated
follow-up.

_________________________________________________________

Nodule # 2:

Location: Left; Inferior

Maximum size: 3.2 cm; Other 2 dimensions: 1.6 x 1.6 cm

Composition: mixed cystic and solid (1)

Echogenicity: hypoechoic (2)

Shape: not taller-than-wide (0)

Margins: ill-defined (0)

Echogenic foci: none (0)

ACR TI-RADS total points: 3.

ACR TI-RADS risk category: TR3 (3 points).

ACR TI-RADS recommendations:

**Given size (>/= 2.5 cm) and appearance, fine needle aspiration of
this mildly suspicious nodule should be considered based on TI-RADS
criteria.

_________________________________________________________
IMPRESSION: 1. Multinodular goiter.
2. Dominant mixed cystic and solid nodule in the left inferior
thyroid (labeled 2, 3.2 cm) meets criteria (TI-RADS category 3) for
tissue sampling. Recommend ultrasound guided fine needle aspiration.

The above is in keeping with the ACR TI-RADS recommendations - [HOSPITAL] [[facility]];[DATE].

## 2020-02-20 MED ORDER — TECHNETIUM TC 99M SESTAMIBI - CARDIOLITE
25.0000 | Freq: Once | INTRAVENOUS | Status: AC | PRN
  Administered 2020-02-20: 25.8 via INTRAVENOUS

## 2020-02-27 ENCOUNTER — Other Ambulatory Visit: Payer: Self-pay | Admitting: Surgery

## 2020-02-27 DIAGNOSIS — E041 Nontoxic single thyroid nodule: Secondary | ICD-10-CM

## 2020-03-05 ENCOUNTER — Other Ambulatory Visit (HOSPITAL_COMMUNITY)
Admission: RE | Admit: 2020-03-05 | Discharge: 2020-03-05 | Disposition: A | Discharge status: Home / Self Care | Payer: Medicaid Other | Source: Ambulatory Visit | Attending: Surgery | Admitting: Surgery

## 2020-03-05 ENCOUNTER — Ambulatory Visit
Admission: RE | Admit: 2020-03-05 | Discharge: 2020-03-05 | Disposition: A | Discharge status: Home / Self Care | Payer: Medicaid Other | Source: Ambulatory Visit | Attending: Surgery | Admitting: Surgery

## 2020-03-05 DIAGNOSIS — E041 Nontoxic single thyroid nodule: Secondary | ICD-10-CM | POA: Diagnosis not present

## 2020-03-05 IMAGING — US US FNA BIOPSY THYROID 1ST LESION
1 series · 13 of 16 positions shown · non-contrast
Comparison: Ultrasound thyroid dated [DATE]

MEDICATIONS:
Lidocaine 1 % 4 mL

COMPLICATIONS:
None immediate.

INDICATION: Indeterminate thyroid nodule

EXAM:
ULTRASOUND GUIDED FINE NEEDLE ASPIRATION OF INDETERMINATE THYROID
NODULE
TECHNIQUE: Informed written consent was obtained from the patient after a
discussion of the risks, benefits and alternatives to treatment.
Questions regarding the procedure were encouraged and answered. A
timeout was performed prior to the initiation of the procedure.

[Series 1: us fna biopsy thyroid 1st lesion · 0.06mm/px · 16 acquisitions, 13 frames shown]
[im 1/16]
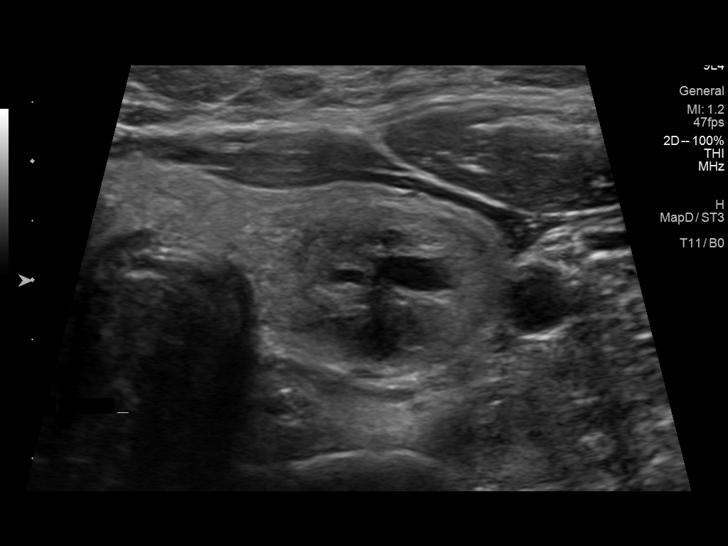
[im 2/16]
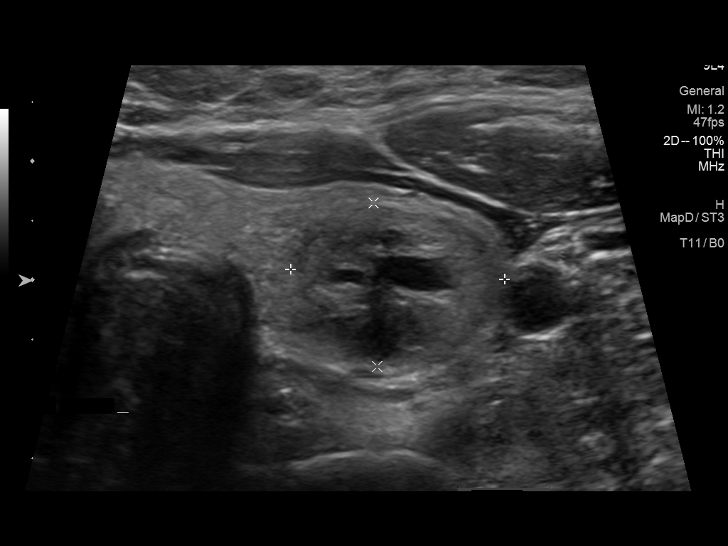
[im 4/16]
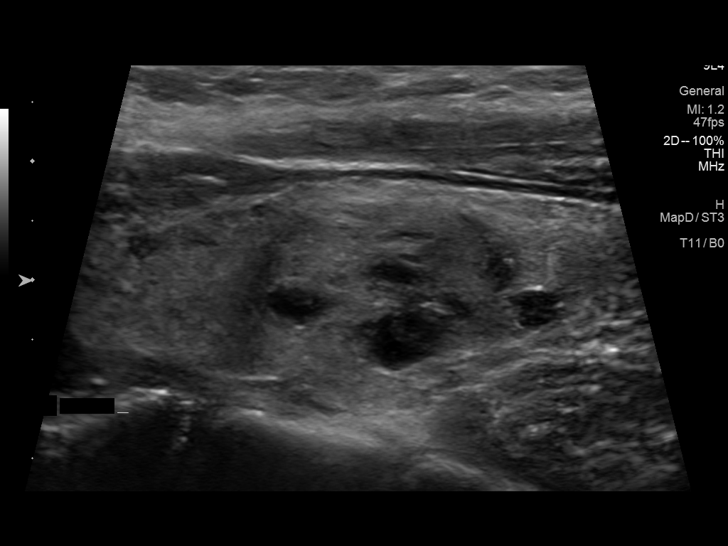
[im 5/16]
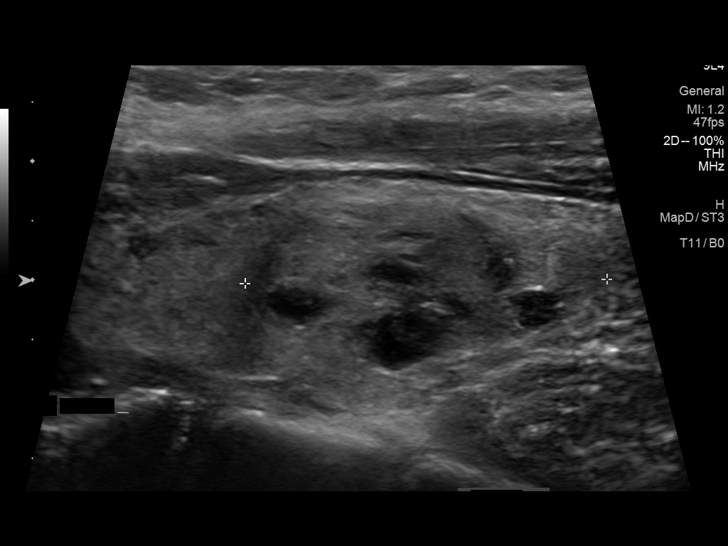
[im 6/16]
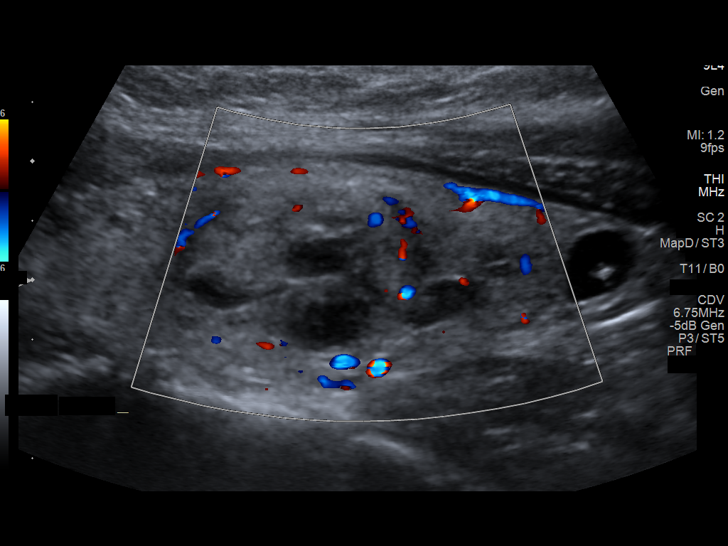
[im 7/16]
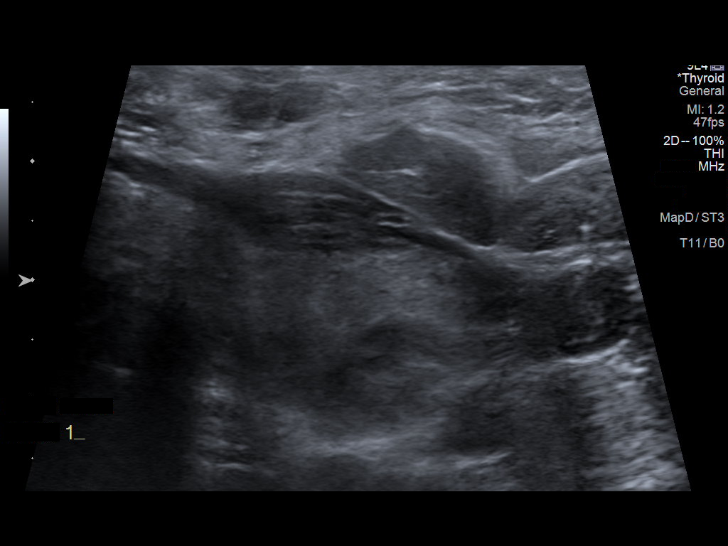
[im 9/16]
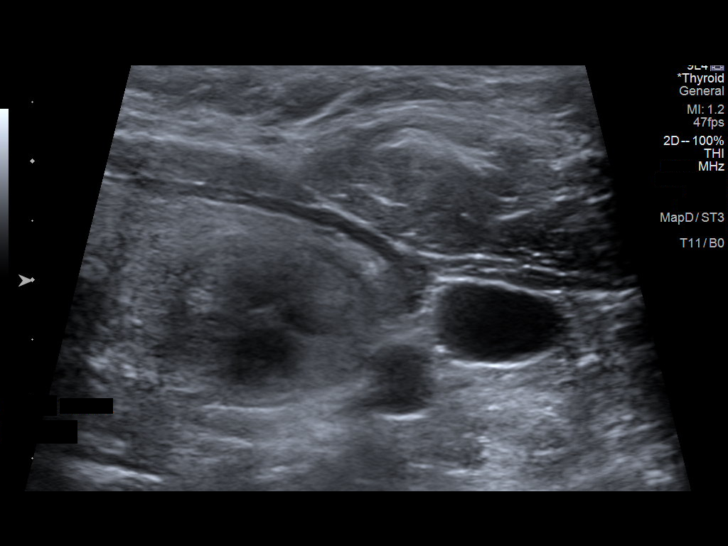
[im 10/16]
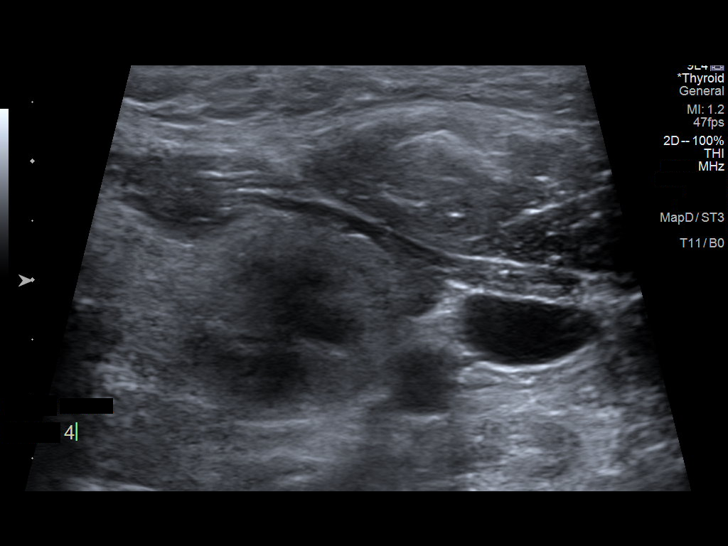
[im 11/16]
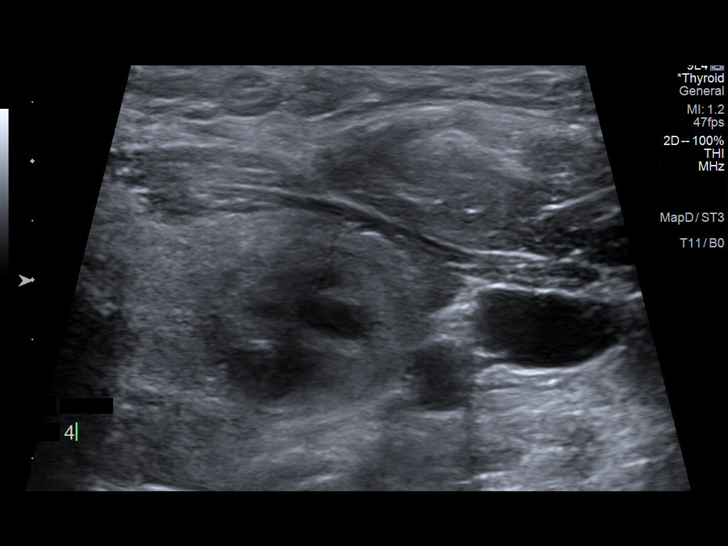
[im 12/16]
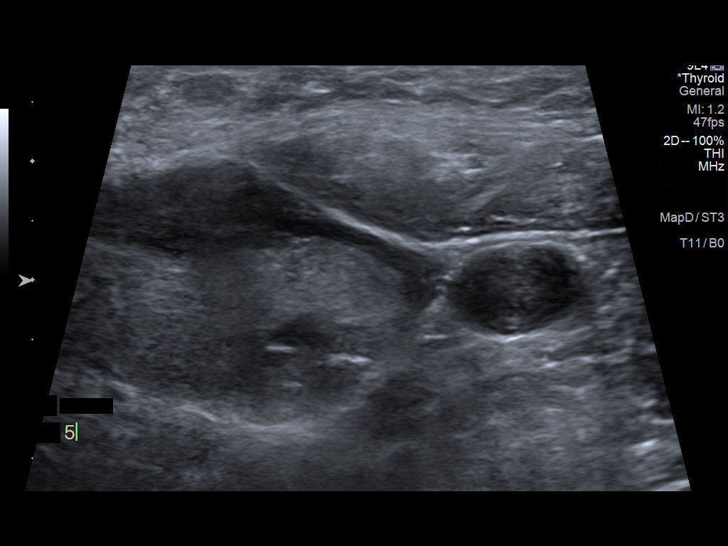
[im 13/16]
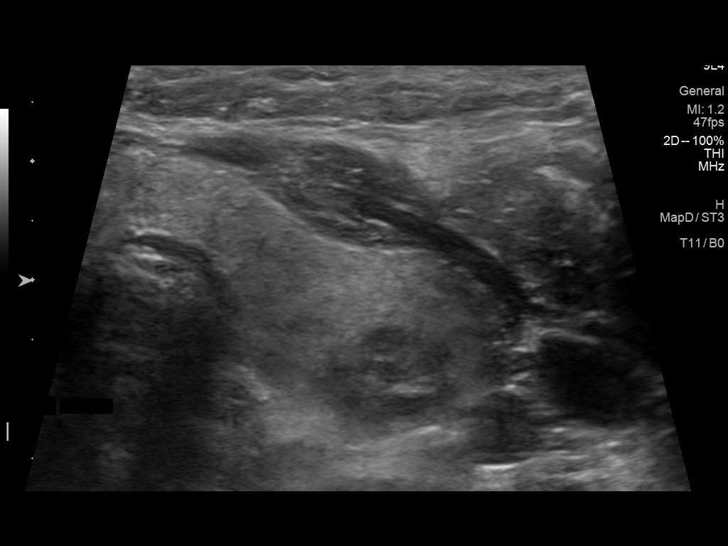
[im 15/16]
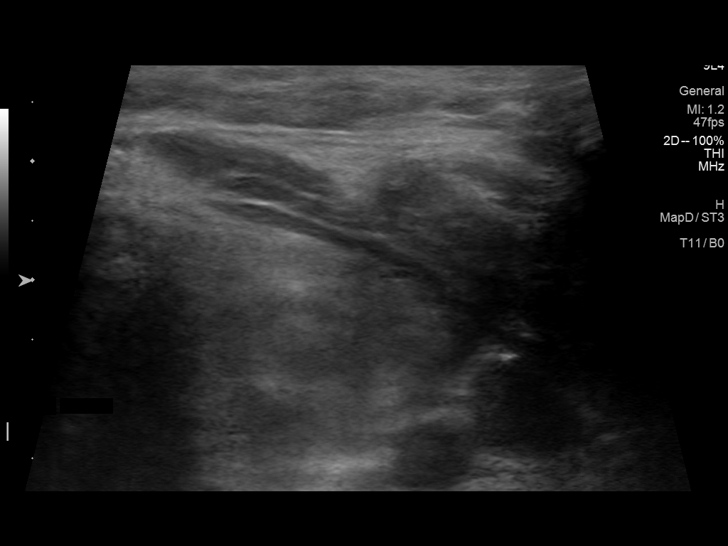
[im 16/16]
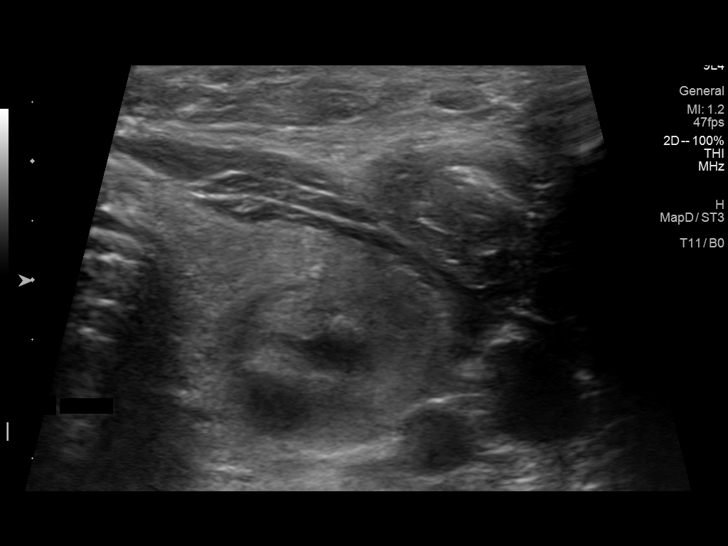

[13 of 16 positions shown; findings below may reference images not displayed]

Pre-procedural ultrasound scanning demonstrated unchanged size and
appearance of the indeterminate nodule within the left thyroid

The procedure was planned. The neck was prepped in the usual sterile
fashion, and a sterile drape was applied covering the operative
field. A timeout was performed prior to the initiation of the
procedure. Local anesthesia was provided with 1% lidocaine.

Under direct ultrasound guidance, 5 FNA biopsies were performed of
the left inferior thyroid nodule with a 25 gauge needle.

Two samples were sent to [facility] per ordering TIGER.

Multiple ultrasound images were saved for procedural documentation
purposes. The samples were prepared and submitted to pathology.

Limited post procedural scanning was negative for hematoma or
additional complication. Dressings were placed. The patient
tolerated the above procedures procedure well without immediate
postprocedural complication.
FINDINGS: Nodule reference number based on prior diagnostic ultrasound: 2

Maximum size: 3.2 cm

Location: Left; Inferior

ACR TI-RADS risk category: TR3 (3 points)

Reason for biopsy: meets ACR TI-RADS criteria

Ultrasound imaging confirms appropriate placement of the needles
within the thyroid nodule.
IMPRESSION: Technically successful ultrasound guided fine needle aspiration of
left inferior thyroid nodule

Read by: TIGER, NP

## 2020-03-09 LAB — CYTOLOGY - NON PAP

## 2020-03-11 ENCOUNTER — Ambulatory Visit: Payer: Self-pay | Admitting: Surgery

## 2020-03-15 ENCOUNTER — Telehealth: Payer: Self-pay

## 2020-03-15 NOTE — Telephone Encounter (Signed)
They were to be done after surgery according to her last office visit note , she is following with Dr Harlow Asa I believe for the surgery.

## 2020-03-15 NOTE — Telephone Encounter (Signed)
Yes , she is also suppose to get labs done as well.

## 2020-03-15 NOTE — Telephone Encounter (Signed)
Does this pt need to get back on the schedule for a follow up? I was scanning papers and seen she did her biopsy. Please advise

## 2020-03-15 NOTE — Telephone Encounter (Signed)
Is it time for her to do labs?

## 2020-03-25 ENCOUNTER — Ambulatory Visit: Payer: Medicaid Other | Admitting: Adult Health

## 2020-03-27 ENCOUNTER — Encounter (HOSPITAL_COMMUNITY): Payer: Self-pay | Admitting: Surgery

## 2020-03-27 NOTE — H&P (Signed)
General Surgery [address] [facility]  VIRGIN ZELLERS DOB: January 10, 1981 Single / Language: Brandi Dunn / Race: Black or African American Female   History of Present Illness   The patient is a 40 year old female who presents with primary hyperparathyroidism.  CHIEF COMPLAINT: primary hyperparathyroidism  Patient is referred by Dr. Glade Lloyd and Dr. Sallee Lange for surgical evaluation and management of newly diagnosed primary hyperparathyroidism. Patient has a history of hypercalcemia dating back approximately 10 years. Recent laboratory studies demonstrated a calcium level as high as 11.2. Intact PTH level was elevated at 124. 24-hour urine collection for calcium was elevated at 383. Vitamin D level was markedly low at 9.6. Patient has had complications including nephrolithiasis and chronic fatigue. She is not had any recent fractures. She has had no prior history neck surgery. There is no family history of parathyroid disease. 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 She has not had any imaging studies performed.   Past Surgical History  Breast Biopsy  Left.  Diagnostic Studies History  Colonoscopy  never Mammogram  within last year Pap Smear  1-5 years ago  Allergies  No Known Allergies   Medication History  Vitamin D (Ergocalciferol) (1.25 MG(50000 UT) Capsule, Oral) Active. Mirena (52 MG) (20MCG/24HR IUD, Intrauterine) Active. Medications Reconciled  Social History  Alcohol use  Occasional alcohol use. Caffeine use  Carbonated beverages. No drug use  Tobacco use  Never smoker.  Family History  Heart Disease  Father. Hypertension  Father, Mother.  Pregnancy / Birth History  Age at menarche  57 years. Contraceptive History  Intrauterine device. Gravida  5 Maternal age  43-25 Para  2 Regular periods   Other Problems  Gastroesophageal Reflux Disease  Kidney Stone  Migraine Headache   Review of Systems   General Not Present- Appetite Loss, Chills, Fatigue, Fever, Night Sweats, Weight Gain and Weight Loss. HEENT Present- Wears glasses/contact lenses. Not Present- Earache, Hearing Loss, Hoarseness, Nose Bleed, Oral Ulcers, Ringing in the Ears, Seasonal Allergies, Sinus Pain, Sore Throat, Visual Disturbances and Yellow Eyes. Respiratory Present- Snoring. Not Present- Bloody sputum, Chronic Cough, Difficulty Breathing and Wheezing. Breast Not Present- Breast Mass, Breast Pain, Nipple Discharge and Skin Changes. Cardiovascular Not Present- Chest Pain, Difficulty Breathing Lying Down, Leg Cramps, Palpitations, Rapid Heart Rate, Shortness of Breath and Swelling of Extremities. Gastrointestinal Not Present- Abdominal Pain, Bloating, Bloody Stool, Change in Bowel Habits, Chronic diarrhea, Constipation, Difficulty Swallowing, Excessive gas, Gets full quickly at meals, Hemorrhoids, Indigestion, Nausea, Rectal Pain and Vomiting. Female Genitourinary Not Present- Frequency, Nocturia, Painful Urination, Pelvic Pain and Urgency.  Vitals  Weight: 245.5 lb Height: 64in Body Surface Area: 2.13 m Body Mass Index: 42.14 kg/m  Temp.: 97.25F  Pulse: 98 (Regular)  P.OX: 99% (Room air) BP: 112/70(Sitting, Left Arm, Standard)  Physical Exam   GENERAL APPEARANCE Development: normal Nutritional status: normal Gross deformities: none  SKIN Rash, lesions, ulcers: none Induration, erythema: none Nodules: none palpable  EYES Conjunctiva and lids: normal Pupils: equal and reactive Iris: normal bilaterally  EARS, NOSE, MOUTH, THROAT External ears: no lesion or deformity External nose: no lesion or deformity Hearing: grossly normal Due to Covid-19 pandemic, patient is wearing a mask.  NECK Symmetric: yes Trachea: midline Thyroid: no palpable nodules in the thyroid bed  CHEST Respiratory effort: normal Retraction or accessory muscle use: no Breath sounds: normal bilaterally Rales, rhonchi,  wheeze: none  CARDIOVASCULAR Auscultation: regular rhythm, normal rate Murmurs: none Pulses: radial pulse 2+ palpable  Lower extremity edema: none  MUSCULOSKELETAL Station and gait: normal Digits and nails: no clubbing or cyanosis Muscle strength: grossly normal all extremities Range of motion: grossly normal all extremities Deformity: none  LYMPHATIC Cervical: none palpable Supraclavicular: none palpable  PSYCHIATRIC Oriented to person, place, and time: yes Mood and affect: normal for situation Judgment and insight: appropriate for situation    Assessment & Plan   PRIMARY HYPERPARATHYROIDISM (E21.0)  Follow Up - Call [facility] office after tests / studies doneto discuss further plans  Patient is referred by her endocrinologist and her primary care physician for surgical evaluation and management of primary hyperparathyroidism.  Patient provided with a copy of "Parathyroid Surgery: Treatment for Your Parathyroid Gland Problem", published by [employer], 12 pages. Book reviewed and explained to patient during visit today.  Patient has biochemical evidence of primary hyperparathyroidism. She does have complications including nephrolithiasis and chronic fatigue. She may have some early bone loss. She does have vitamin D deficiency.  We will proceed with further evaluation to include an ultrasound examination of the neck as well as a parathyroid nuclear medicine sestamibi scan. Once the studies are completed, I will review them. If they confirm the diagnosis and localized the parathyroid adenoma, then I believe she will be a good candidate for the minimally invasive outpatient surgery. If these 2 studies failed to localize an adenoma, then we will obtain a 4D CT scan of the neck in hopes of identifying the location of the parathyroid adenoma preoperatively.  We discussed minimally invasive outpatient surgery. We discussed the risk and benefits. We discussed the fact that the  disease involves a single gland approximately 95% at the time. We discussed the surgical procedure, the hospital stay, and the postoperative recovery. We discussed time out of work. The patient understands and wishes to proceed with further evaluation.  Patient will undergo the above studies. We will contact her when those results are available for review.  ADDENDUM  Telephone call to patient with results of fine-needle aspiration biopsy of thyroid nodule. This appears to be a benign follicular nodule with no sign of atypia or malignancy.  Nuclear medicine parathyroid scan dated February 20, 2020 demonstrated a left inferior parathyroid adenoma. Patient is a good candidate to proceed with minimally invasive outpatient surgery. We discussed this again today by telephone. She would like to proceed. We will make arrangements for surgery in the near future. I will send orders to the scheduling department.  The risks and benefits of the procedure have been discussed at length with the patient. The patient understands the proposed procedure, potential alternative treatments, and the course of recovery to be expected. All of the patient's questions have been answered at this time. The patient wishes to proceed with surgery.  Armandina Gemma, [facility] Office: [phone]

## 2020-03-31 NOTE — Patient Instructions (Addendum)
DUE TO COVID-19 ONLY ONE VISITOR IS ALLOWED TO COME WITH YOU AND STAY IN THE WAITING ROOM ONLY DURING PRE OP AND PROCEDURE DAY OF SURGERY. THE 1 VISITOR  MAY VISIT WITH YOU AFTER SURGERY IN YOUR PRIVATE ROOM DURING VISITING HOURS ONLY!  YOU NEED TO HAVE A COVID 19 TEST ON_2/28______ @_2 :55______, THIS TEST MUST BE DONE BEFORE SURGERY,  COVID TESTING SITE [address] [place] [address], IT IS ON THE RIGHT GOING OUT [address] APPROXIMATELY  2 MINUTES PAST [employer] ON THE RIGHT. ONCE YOUR COVID TEST IS COMPLETED,  PLEASE BEGIN THE QUARANTINE INSTRUCTIONS AS OUTLINED IN YOUR HANDOUT.                Brandi Dunn    Your procedure is scheduled on: 04/08/20   Report to [facility] Main  Entrance   Report to Short stay at 5:30 AM     Call this number if you have problems the morning of surgery [phone]    Remember: Do not eat food or drink liquids :After Midnight.   BRUSH YOUR TEETH MORNING OF SURGERY AND RINSE YOUR MOUTH OUT, NO CHEWING GUM CANDY OR MINTS.     Take these medicines the morning of surgery with A SIP OF WATER: None                                 You may not have any metal on your body including hair pins and              piercings  Do not wear jewelry, make-up, lotions, powders or perfumes, deodorant             Do not wear nail polish on your fingernails.  Do not shave  48 hours prior to surgery.            Do not bring valuables to the hospital. [place].  Contacts, dentures or bridgework may not be worn into surgery.       Patients discharged the day of surgery will not be allowed to drive home.   IF YOU ARE HAVING SURGERY AND GOING HOME THE SAME DAY, YOU MUST HAVE AN ADULT TO DRIVE YOU HOME AND BE WITH YOU FOR 24 HOURS.   YOU MAY GO HOME BY TAXI OR UBER OR ORTHERWISE, BUT AN ADULT MUST ACCOMPANY YOU HOME AND STAY WITH YOU FOR 24 HOURS.  Name and phone number of your  driver:  Special Instructions: N/A              Please read over the following fact sheets you were given: _____________________________________________________________________             [facility] - Preparing for Surgery Before surgery, you can play an important role.  Because skin is not sterile, your skin needs to be as free of germs as possible.  You can reduce the number of germs on your skin by washing with CHG (chlorahexidine gluconate) soap before surgery.  CHG is an antiseptic cleaner which kills germs and bonds with the skin to continue killing germs even after washing. Please DO NOT use if you have an allergy to CHG or antibacterial soaps.  If your skin becomes reddened/irritated stop using the CHG and inform your nurse when you arrive at Short  Stay. Do not shave (including legs and underarms) for at least 48 hours prior to the first CHG shower. . Please follow these instructions carefully:  1.  Shower with CHG Soap the night before surgery and the  morning of Surgery.  2.  If you choose to wash your hair, wash your hair first as usual with your  normal  shampoo.  3.  After you shampoo, rinse your hair and body thoroughly to remove the  shampoo.                                        4.  Use CHG as you would any other liquid soap.  You can apply chg directly  to the skin and wash                       Gently with a scrungie or clean washcloth.  5.  Apply the CHG Soap to your body ONLY FROM THE NECK DOWN.   Do not use on face/ open                           Wound or open sores. Avoid contact with eyes, ears mouth and genitals (private parts).                       Wash face,  Genitals (private parts) with your normal soap.             6.  Wash thoroughly, paying special attention to the area where your surgery  will be performed.  7.  Thoroughly rinse your body with warm water from the neck down.  8.  DO NOT shower/wash with your normal soap after using and rinsing off  the CHG  Soap.             9.  Pat yourself dry with a clean towel.            10.  Wear clean pajamas.            11.  Place clean sheets on your bed the night of your first shower and do not  sleep with pets. Day of Surgery : Do not apply any lotions/deodorants the morning of surgery.  Please wear clean clothes to the hospital/surgery center.  FAILURE TO FOLLOW THESE INSTRUCTIONS MAY RESULT IN THE CANCELLATION OF YOUR SURGERY PATIENT SIGNATURE_________________________________  NURSE SIGNATURE__________________________________  ________________________________________________________________________

## 2020-04-01 ENCOUNTER — Other Ambulatory Visit: Payer: Self-pay

## 2020-04-01 ENCOUNTER — Encounter (HOSPITAL_COMMUNITY): Payer: Self-pay

## 2020-04-01 ENCOUNTER — Encounter (HOSPITAL_COMMUNITY)
Admission: RE | Admit: 2020-04-01 | Discharge: 2020-04-01 | Disposition: A | Discharge status: Home / Self Care | Payer: Medicaid Other | Source: Ambulatory Visit | Attending: Surgery | Admitting: Surgery

## 2020-04-01 DIAGNOSIS — Z01812 Encounter for preprocedural laboratory examination: Secondary | ICD-10-CM | POA: Insufficient documentation

## 2020-04-01 HISTORY — DX: Personal history of urinary calculi: Z87.442

## 2020-04-01 LAB — CBC
HCT: 43.4 % (ref 36.0–46.0)
Hemoglobin: 13.5 g/dL (ref 12.0–15.0)
MCH: 28.5 pg (ref 26.0–34.0)
MCHC: 31.1 g/dL (ref 30.0–36.0)
MCV: 91.8 fL (ref 80.0–100.0)
Platelets: 251 10*3/uL (ref 150–400)
RBC: 4.73 MIL/uL (ref 3.87–5.11)
RDW: 12.9 % (ref 11.5–15.5)
WBC: 6.8 10*3/uL (ref 4.0–10.5)
nRBC: 0 % (ref 0.0–0.2)

## 2020-04-01 NOTE — Progress Notes (Signed)
COVID Vaccine Completed:Yes Date COVID Vaccine completed:06/03/19- booster 02/13/20 COVID vaccine manufacturer: Pfizer  PCP - Dr. Lance Sell Cardiologist - no  Chest x-ray -no  EKG -no  Stress Test - no ECHO - no Cardiac Cath - no Pacemaker/ICD device last checked:NA  Sleep Study - no CPAP -   Fasting Blood Sugar - NA Checks Blood Sugar _____ times a day  Blood Thinner Instructions:NA Aspirin Instructions: Last Dose:  Anesthesia review:   Patient denies shortness of breath, fever, cough and chest pain at PAT appointment yes  Patient verbalized understanding of instructions that were given to them at the PAT appointment. Patient was also instructed that they will need to review over the PAT instructions again at home before surgery.yes Pt gets winded after 2 flights of stairs but no with doing housework or ADLs

## 2020-04-05 ENCOUNTER — Other Ambulatory Visit (HOSPITAL_COMMUNITY)
Admission: RE | Admit: 2020-04-05 | Discharge: 2020-04-05 | Disposition: A | Discharge status: Home / Self Care | Payer: Medicaid Other | Source: Ambulatory Visit | Attending: Surgery | Admitting: Surgery

## 2020-04-05 DIAGNOSIS — Z20822 Contact with and (suspected) exposure to covid-19: Secondary | ICD-10-CM | POA: Insufficient documentation

## 2020-04-05 DIAGNOSIS — Z01812 Encounter for preprocedural laboratory examination: Secondary | ICD-10-CM | POA: Diagnosis not present

## 2020-04-06 LAB — SARS CORONAVIRUS 2 (TAT 6-24 HRS): SARS Coronavirus 2: NEGATIVE

## 2020-04-07 ENCOUNTER — Encounter (HOSPITAL_COMMUNITY): Payer: Self-pay | Admitting: Surgery

## 2020-04-08 ENCOUNTER — Encounter (HOSPITAL_COMMUNITY)
Admission: RE | Disposition: A | Discharge status: Home / Self Care | Payer: Self-pay | Source: Home / Self Care | Attending: Surgery

## 2020-04-08 ENCOUNTER — Encounter (HOSPITAL_COMMUNITY): Payer: Self-pay | Admitting: Surgery

## 2020-04-08 ENCOUNTER — Ambulatory Visit (HOSPITAL_COMMUNITY)
Admission: RE | Admit: 2020-04-08 | Discharge: 2020-04-08 | Disposition: A | Discharge status: Home / Self Care | Payer: Medicaid Other | Attending: Surgery | Admitting: Surgery

## 2020-04-08 ENCOUNTER — Ambulatory Visit (HOSPITAL_COMMUNITY): Payer: Medicaid Other | Admitting: Registered Nurse

## 2020-04-08 DIAGNOSIS — Z79899 Other long term (current) drug therapy: Secondary | ICD-10-CM | POA: Diagnosis not present

## 2020-04-08 DIAGNOSIS — Z87442 Personal history of urinary calculi: Secondary | ICD-10-CM | POA: Insufficient documentation

## 2020-04-08 DIAGNOSIS — D351 Benign neoplasm of parathyroid gland: Secondary | ICD-10-CM | POA: Insufficient documentation

## 2020-04-08 DIAGNOSIS — K219 Gastro-esophageal reflux disease without esophagitis: Secondary | ICD-10-CM | POA: Insufficient documentation

## 2020-04-08 DIAGNOSIS — Z8249 Family history of ischemic heart disease and other diseases of the circulatory system: Secondary | ICD-10-CM | POA: Insufficient documentation

## 2020-04-08 DIAGNOSIS — E559 Vitamin D deficiency, unspecified: Secondary | ICD-10-CM | POA: Diagnosis not present

## 2020-04-08 DIAGNOSIS — E210 Primary hyperparathyroidism: Secondary | ICD-10-CM | POA: Insufficient documentation

## 2020-04-08 DIAGNOSIS — G43909 Migraine, unspecified, not intractable, without status migrainosus: Secondary | ICD-10-CM | POA: Diagnosis not present

## 2020-04-08 HISTORY — PX: PARATHYROIDECTOMY: SHX19

## 2020-04-08 LAB — PREGNANCY, URINE: Preg Test, Ur: NEGATIVE

## 2020-04-08 SURGERY — PARATHYROIDECTOMY
Anesthesia: General | Laterality: Left

## 2020-04-08 MED ORDER — MIDAZOLAM HCL 5 MG/5ML IJ SOLN
INTRAMUSCULAR | Status: DC | PRN
  Administered 2020-04-08: 2 mg via INTRAVENOUS

## 2020-04-08 MED ORDER — ORAL CARE MOUTH RINSE: 15.0000 mL | Freq: Once | OROMUCOSAL | Status: AC

## 2020-04-08 MED ORDER — DEXAMETHASONE SODIUM PHOSPHATE 10 MG/ML IJ SOLN
INTRAMUSCULAR | Status: DC | PRN
  Administered 2020-04-08: 10 mg via INTRAVENOUS

## 2020-04-08 MED ORDER — ONDANSETRON HCL 4 MG/2ML IJ SOLN
INTRAMUSCULAR | Status: AC
  Filled 2020-04-08: qty 2

## 2020-04-08 MED ORDER — LIDOCAINE HCL (PF) 2 % IJ SOLN
INTRAMUSCULAR | Status: AC
  Filled 2020-04-08: qty 5

## 2020-04-08 MED ORDER — AMISULPRIDE (ANTIEMETIC) 5 MG/2ML IV SOLN: 10.0000 mg | Freq: Once | INTRAVENOUS | Status: DC | PRN

## 2020-04-08 MED ORDER — ONDANSETRON HCL 4 MG/2ML IJ SOLN
INTRAMUSCULAR | Status: DC | PRN
Start: 2020-04-08 — End: 2020-04-08
  Administered 2020-04-08: 4 mg via INTRAVENOUS

## 2020-04-08 MED ORDER — ACETAMINOPHEN 10 MG/ML IV SOLN: 1000.0000 mg | Freq: Once | INTRAVENOUS | Status: DC | PRN

## 2020-04-08 MED ORDER — FENTANYL CITRATE (PF) 100 MCG/2ML IJ SOLN
INTRAMUSCULAR | Status: AC
  Filled 2020-04-08: qty 2

## 2020-04-08 MED ORDER — PROPOFOL 10 MG/ML IV BOLUS
INTRAVENOUS | Status: AC
  Filled 2020-04-08: qty 40

## 2020-04-08 MED ORDER — ACETAMINOPHEN 325 MG PO TABS: 325.0000 mg | ORAL_TABLET | Freq: Once | ORAL | Status: DC | PRN

## 2020-04-08 MED ORDER — ROCURONIUM BROMIDE 10 MG/ML (PF) SYRINGE
PREFILLED_SYRINGE | INTRAVENOUS | Status: AC
  Filled 2020-04-08: qty 10

## 2020-04-08 MED ORDER — CEFAZOLIN SODIUM-DEXTROSE 2-4 GM/100ML-% IV SOLN
2.0000 g | INTRAVENOUS | Status: AC
  Administered 2020-04-08: 2 g via INTRAVENOUS
  Filled 2020-04-08: qty 100

## 2020-04-08 MED ORDER — DEXAMETHASONE SODIUM PHOSPHATE 10 MG/ML IJ SOLN
INTRAMUSCULAR | Status: AC
  Filled 2020-04-08: qty 1

## 2020-04-08 MED ORDER — CHLORHEXIDINE GLUCONATE CLOTH 2 % EX PADS: 6.0000 | MEDICATED_PAD | Freq: Once | CUTANEOUS | Status: DC

## 2020-04-08 MED ORDER — PROPOFOL 10 MG/ML IV BOLUS
INTRAVENOUS | Status: DC | PRN
  Administered 2020-04-08: 200 mg via INTRAVENOUS

## 2020-04-08 MED ORDER — LACTATED RINGERS IV SOLN
INTRAVENOUS | Status: DC
  Administered 2020-04-08: 1000 mL via INTRAVENOUS

## 2020-04-08 MED ORDER — LIDOCAINE 2% (20 MG/ML) 5 ML SYRINGE
INTRAMUSCULAR | Status: DC | PRN
  Administered 2020-04-08: 60 mg via INTRAVENOUS

## 2020-04-08 MED ORDER — SUGAMMADEX SODIUM 500 MG/5ML IV SOLN
INTRAVENOUS | Status: AC
  Filled 2020-04-08: qty 5

## 2020-04-08 MED ORDER — SCOPOLAMINE 1 MG/3DAYS TD PT72
MEDICATED_PATCH | TRANSDERMAL | Status: AC
  Filled 2020-04-08: qty 1

## 2020-04-08 MED ORDER — FENTANYL CITRATE (PF) 100 MCG/2ML IJ SOLN
25.0000 ug | INTRAMUSCULAR | Status: DC | PRN
  Administered 2020-04-08 (×3): 50 ug via INTRAVENOUS

## 2020-04-08 MED ORDER — MIDAZOLAM HCL 2 MG/2ML IJ SOLN
INTRAMUSCULAR | Status: AC
  Filled 2020-04-08: qty 2

## 2020-04-08 MED ORDER — FENTANYL CITRATE (PF) 250 MCG/5ML IJ SOLN
INTRAMUSCULAR | Status: AC
  Filled 2020-04-08: qty 5

## 2020-04-08 MED ORDER — SUGAMMADEX SODIUM 200 MG/2ML IV SOLN
INTRAVENOUS | Status: DC | PRN
  Administered 2020-04-08: 500 mg via INTRAVENOUS

## 2020-04-08 MED ORDER — ROCURONIUM BROMIDE 10 MG/ML (PF) SYRINGE
PREFILLED_SYRINGE | INTRAVENOUS | Status: DC | PRN
  Administered 2020-04-08: 100 mg via INTRAVENOUS

## 2020-04-08 MED ORDER — BUPIVACAINE HCL 0.25 % IJ SOLN
INTRAMUSCULAR | Status: DC | PRN
  Administered 2020-04-08: 10 mL

## 2020-04-08 MED ORDER — BUPIVACAINE HCL (PF) 0.25 % IJ SOLN
INTRAMUSCULAR | Status: AC
  Filled 2020-04-08: qty 30

## 2020-04-08 MED ORDER — CHLORHEXIDINE GLUCONATE 0.12 % MT SOLN
15.0000 mL | Freq: Once | OROMUCOSAL | Status: AC
  Administered 2020-04-08: 15 mL via OROMUCOSAL

## 2020-04-08 MED ORDER — ACETAMINOPHEN 160 MG/5ML PO SOLN: 325.0000 mg | Freq: Once | ORAL | Status: DC | PRN

## 2020-04-08 MED ORDER — MEPERIDINE HCL 50 MG/ML IJ SOLN: 6.2500 mg | INTRAMUSCULAR | Status: DC | PRN

## 2020-04-08 MED ORDER — FENTANYL CITRATE (PF) 250 MCG/5ML IJ SOLN
INTRAMUSCULAR | Status: DC | PRN
  Administered 2020-04-08: 100 ug via INTRAVENOUS
  Administered 2020-04-08: 50 ug via INTRAVENOUS

## 2020-04-08 MED ORDER — SCOPOLAMINE 1 MG/3DAYS TD PT72
MEDICATED_PATCH | TRANSDERMAL | Status: DC | PRN
  Administered 2020-04-08: 1 via TRANSDERMAL

## 2020-04-08 MED ORDER — PHENYLEPHRINE HCL-NACL 10-0.9 MG/250ML-% IV SOLN
INTRAVENOUS | Status: AC
  Filled 2020-04-08: qty 250

## 2020-04-08 MED ORDER — LACTATED RINGERS IV SOLN: INTRAVENOUS | Status: DC

## 2020-04-08 MED ORDER — TRAMADOL HCL 50 MG PO TABS: 50.0000 mg | ORAL_TABLET | Freq: Four times a day (QID) | ORAL | 0 refills | Status: DC | PRN

## 2020-04-08 SURGICAL SUPPLY — 35 items
ADH SKN CLS APL DERMABOND .7 (GAUZE/BANDAGES/DRESSINGS) ×1
APL PRP STRL LF DISP 70% ISPRP (MISCELLANEOUS) ×1
ATTRACTOMAT 16X20 MAGNETIC DRP (DRAPES) ×2 IMPLANT
BLADE SURG 15 STRL LF DISP TIS (BLADE) ×1 IMPLANT
BLADE SURG 15 STRL SS (BLADE) ×2
CHLORAPREP W/TINT 26 (MISCELLANEOUS) ×2 IMPLANT
CLIP VESOCCLUDE MED 6/CT (CLIP) ×4 IMPLANT
CLIP VESOCCLUDE SM WIDE 6/CT (CLIP) ×5 IMPLANT
CNTNR URN SCR LID CUP LEK RST (MISCELLANEOUS) IMPLANT
CONT SPEC 4OZ STRL OR WHT (MISCELLANEOUS) ×2
COVER SURGICAL LIGHT HANDLE (MISCELLANEOUS) ×2 IMPLANT
COVER WAND RF STERILE (DRAPES) ×2 IMPLANT
DERMABOND ADVANCED (GAUZE/BANDAGES/DRESSINGS) ×1
DERMABOND ADVANCED .7 DNX12 (GAUZE/BANDAGES/DRESSINGS) ×1 IMPLANT
DRAPE LAPAROTOMY T 98X78 PEDS (DRAPES) ×2 IMPLANT
DRAPE UTILITY XL STRL (DRAPES) ×2 IMPLANT
ELECT REM PT RETURN 15FT ADLT (MISCELLANEOUS) ×2 IMPLANT
GAUZE 4X4 16PLY RFD (DISPOSABLE) ×2 IMPLANT
GLOVE SURG ORTHO LTX SZ8 (GLOVE) ×2 IMPLANT
GOWN STRL REUS W/TWL XL LVL3 (GOWN DISPOSABLE) ×6 IMPLANT
HEMOSTAT SURGICEL 2X4 FIBR (HEMOSTASIS) ×2 IMPLANT
ILLUMINATOR WAVEGUIDE N/F (MISCELLANEOUS) IMPLANT
KIT BASIN OR (CUSTOM PROCEDURE TRAY) ×2 IMPLANT
KIT TURNOVER KIT A (KITS) ×2 IMPLANT
NDL HYPO 25X1 1.5 SAFETY (NEEDLE) ×1 IMPLANT
NEEDLE HYPO 25X1 1.5 SAFETY (NEEDLE) ×2 IMPLANT
PACK BASIC VI WITH GOWN DISP (CUSTOM PROCEDURE TRAY) ×2 IMPLANT
PENCIL SMOKE EVACUATOR (MISCELLANEOUS) ×2 IMPLANT
SUT MNCRL AB 4-0 PS2 18 (SUTURE) ×2 IMPLANT
SUT VIC AB 3-0 SH 18 (SUTURE) ×3 IMPLANT
SYR BULB IRRIG 60ML STRL (SYRINGE) ×2 IMPLANT
SYR CONTROL 10ML LL (SYRINGE) ×2 IMPLANT
TOWEL OR 17X26 10 PK STRL BLUE (TOWEL DISPOSABLE) ×2 IMPLANT
TOWEL OR NON WOVEN STRL DISP B (DISPOSABLE) ×2 IMPLANT
TUBING CONNECTING 10 (TUBING) ×2 IMPLANT

## 2020-04-08 NOTE — Anesthesia Procedure Notes (Signed)
Procedure Name: Intubation Date/Time: 04/08/2020 7:34 AM Performed by: Talbot Grumbling, CRNA Pre-anesthesia Checklist: Patient identified, Emergency Drugs available, Suction available and Patient being monitored Patient Re-evaluated:Patient Re-evaluated prior to induction Oxygen Delivery Method: Circle system utilized Preoxygenation: Pre-oxygenation with 100% oxygen Induction Type: IV induction Ventilation: Mask ventilation without difficulty Laryngoscope Size: Mac and 3 Grade View: Grade I Tube type: Oral Tube size: 7.5 mm Number of attempts: 1 Airway Equipment and Method: Stylet Placement Confirmation: ETT inserted through vocal cords under direct vision,  positive ETCO2 and breath sounds checked- equal and bilateral Secured at: 22 cm Tube secured with: Tape Dental Injury: Teeth and Oropharynx as per pre-operative assessment

## 2020-04-08 NOTE — Interval H&P Note (Signed)
History and Physical Interval Note:  04/08/2020 6:59 AM  Brandi Dunn  has presented today for surgery, with the diagnosis of primary hyperparathyroidism.  The various methods of treatment have been discussed with the patient and family. After consideration of risks, benefits and other options for treatment, the patient has consented to    Procedure(s) with comments: LEFT INFERIOR PARATHYROIDECTOMY (Left) - LDOW ROOM 4  90 MIN as a surgical intervention.    The patient's history has been reviewed, patient examined, no change in status, stable for surgery.  I have reviewed the patient's chart and labs.  Questions were answered to the patient's satisfaction.    Armandina Gemma, MD [facility] Office: [place]

## 2020-04-08 NOTE — Anesthesia Postprocedure Evaluation (Signed)
Anesthesia Post Note  Patient: Brandi Dunn  Procedure(s) Performed: LEFT INFERIOR PARATHYROIDECTOMY (Left )     Patient location during evaluation: PACU Anesthesia Type: General Level of consciousness: awake and alert Pain management: pain level controlled Vital Signs Assessment: post-procedure vital signs reviewed and stable Respiratory status: spontaneous breathing, nonlabored ventilation, respiratory function stable and patient connected to nasal cannula oxygen Cardiovascular status: blood pressure returned to baseline and stable Postop Assessment: no apparent nausea or vomiting Anesthetic complications: no   No complications documented.  Last Vitals:  Vitals:   04/08/20 1030 04/08/20 1045  BP: 136/87 (!) 143/88  Pulse: 77 95  Resp: 20 (!) 22  Temp: (!) 36.1 C   SpO2: 99% 97%    Last Pain:  Vitals:   04/08/20 1030  TempSrc:   PainSc: Brandi Dunn

## 2020-04-08 NOTE — Anesthesia Preprocedure Evaluation (Addendum)
Anesthesia Evaluation  Patient identified by MRN, date of birth, ID band Patient awake    Reviewed: Allergy & Precautions, NPO status , Patient's Chart, lab work & pertinent test results  Airway Mallampati: III     Mouth opening: Limited Mouth Opening  Dental  (+) Teeth Intact, Dental Advisory Given   Pulmonary neg pulmonary ROS,    Pulmonary exam normal        Cardiovascular negative cardio ROS   Rhythm:Regular Rate:Normal     Neuro/Psych  Headaches, negative psych ROS   GI/Hepatic Neg liver ROS, GERD  ,  Endo/Other  negative endocrine ROS  Renal/GU Renal disease     Musculoskeletal negative musculoskeletal ROS (+)   Abdominal (+) + obese,   Peds  Hematology negative hematology ROS (+)   Anesthesia Other Findings   Reproductive/Obstetrics                            Anesthesia Physical Anesthesia Plan  ASA: III  Anesthesia Plan: General   Post-op Pain Management:    Induction: Intravenous  PONV Risk Score and Plan: 4 or greater and Ondansetron, Dexamethasone, Midazolam and Scopolamine patch - Pre-op  Airway Management Planned: Oral ETT  Additional Equipment: None  Intra-op Plan:   Post-operative Plan: Extubation in OR  Informed Consent: I have reviewed the patients History and Physical, chart, labs and discussed the procedure including the risks, benefits and alternatives for the proposed anesthesia with the patient or authorized representative who has indicated his/her understanding and acceptance.     Dental advisory given  Plan Discussed with: CRNA  Anesthesia Plan Comments:        Anesthesia Quick Evaluation

## 2020-04-08 NOTE — Transfer of Care (Signed)
Immediate Anesthesia Transfer of Care Note  Patient: Brandi Dunn  Procedure(s) Performed: LEFT INFERIOR PARATHYROIDECTOMY (Left )  Patient Location: PACU  Anesthesia Type:General  Level of Consciousness: sedated  Airway & Oxygen Therapy: Patient Spontanous Breathing and Patient connected to face mask oxygen  Post-op Assessment: Report given to RN and Post -op Vital signs reviewed and stable  Post vital signs: Reviewed and stable  Last Vitals:  Vitals Value Taken Time  BP    Temp    Pulse 91 04/08/20 0841  Resp 12 04/08/20 0841  SpO2 100 % 04/08/20 0841  Vitals shown include unvalidated device data.  Last Pain:  Vitals:   04/08/20 9024  TempSrc:   PainSc: 0-No pain         Complications: No complications documented.

## 2020-04-08 NOTE — Discharge Instructions (Addendum)
General Anesthesia, Adult, Care After This sheet gives you information about how to care for yourself after your procedure. Your health care provider may also give you more specific instructions. If you have problems or questions, contact your health care provider. What can I expect after the procedure? After the procedure, the following side effects are common:  Pain or discomfort at the IV site.  Nausea.  Vomiting.  Sore throat.  Trouble concentrating.  Feeling cold or chills.  Feeling weak or tired.  Sleepiness and fatigue.  Soreness and body aches. These side effects can affect parts of the body that were not involved in surgery. Follow these instructions at home: For the time period you were told by your health care provider:  Rest.  Do not participate in activities where you could fall or become injured.  Do not drive or use machinery.  Do not drink alcohol.  Do not take sleeping pills or medicines that cause drowsiness.  Do not make important decisions or sign legal documents.  Do not take care of children on your own.   Eating and drinking  Follow any instructions from your health care provider about eating or drinking restrictions.  When you feel hungry, start by eating small amounts of foods that are soft and easy to digest (bland), such as toast. Gradually return to your regular diet.  Drink enough fluid to keep your urine pale yellow.  If you vomit, rehydrate by drinking water, juice, or clear broth. General instructions  If you have sleep apnea, surgery and certain medicines can increase your risk for breathing problems. Follow instructions from your health care provider about wearing your sleep device: ? Anytime you are sleeping, including during daytime naps. ? While taking prescription pain medicines, sleeping medicines, or medicines that make you drowsy.  Have a responsible adult stay with you for the time you are told. It is important to have  someone help care for you until you are awake and alert.  Return to your normal activities as told by your health care provider. Ask your health care provider what activities are safe for you.  Take over-the-counter and prescription medicines only as told by your health care provider.  If you smoke, do not smoke without supervision.  Keep all follow-up visits as told by your health care provider. This is important. Contact a health care provider if:  You have nausea or vomiting that does not get better with medicine.  You cannot eat or drink without vomiting.  You have pain that does not get better with medicine.  You are unable to pass urine.  You develop a skin rash.  You have a fever.  You have redness around your IV site that gets worse. Get help right away if:  You have difficulty breathing.  You have chest pain.  You have blood in your urine or stool, or you vomit blood. Summary  After the procedure, it is common to have a sore throat or nausea. It is also common to feel tired.  Have a responsible adult stay with you for the time you are told. It is important to have someone help care for you until you are awake and alert.  When you feel hungry, start by eating small amounts of foods that are soft and easy to digest (bland), such as toast. Gradually return to your regular diet.  Drink enough fluid to keep your urine pale yellow.  Return to your normal activities as told by your health care provider.   Ask your health care provider what activities are safe for you. This information is not intended to replace advice given to you by your health care provider. Make sure you discuss any questions you have with your health care provider. Document Revised: 10/09/2019 Document Reviewed: 05/08/2019 Elsevier Patient Education  2021 [place]  THYROID & PARATHYROID SURGERY:  POST-OP INSTRUCTIONS  Always review your discharge instruction  sheet from the facility where your surgery was performed.  A prescription for pain medication may be given to you upon discharge.  Take your pain medication as prescribed.  If narcotic pain medicine is not needed, then you may take acetaminophen (Tylenol) or ibuprofen (Advil) as needed.  Take your usually prescribed medications unless otherwise directed.  If you need a refill on your pain medication, please contact our office during regular business hours.  Prescriptions cannot be processed by our office after 5 pm or on weekends.  Start with a light diet upon arrival home, such as soup and crackers or toast.  Be sure to drink plenty of fluids daily.  Resume your normal diet the day after surgery.  Most patients will experience some swelling and bruising on the chest and neck area.  Ice packs will help.  Swelling and bruising can take several days to resolve.   It is common to experience some constipation after surgery.  Increasing fluid intake and taking a stool softener (Colace) will usually help or prevent this problem.  A mild laxative (Milk of Magnesia or Miralax) should be taken according to package directions if there has been no bowel movement after 48 hours.  You have steri-strips and a gauze dressing over your incision.  You may remove the gauze bandage on the second day after surgery, and you may shower at that time.  Leave your steri-strips (small skin tapes) in place directly over the incision.  These strips should remain on the skin for 5-7 days and then be removed.  You may get them wet in the shower and pat them dry.  You may resume regular (light) daily activities beginning the next day (such as daily self-care, walking, climbing stairs) gradually increasing activities as tolerated.  You may have sexual intercourse when it is comfortable.  Refrain from any heavy lifting or straining until approved by your doctor.  You may drive when you no longer are taking prescription pain  medication, you can comfortably wear a seatbelt, and you can safely maneuver your car and apply brakes.  You should see your doctor in the office for a follow-up appointment approximately three weeks after your surgery.  Make sure that you call for this appointment within a day or two after you arrive home to insure a convenient appointment time.  WHEN TO CALL YOUR DOCTOR: -- Fever greater than 101.5 -- Inability to urinate -- Nausea and/or vomiting - persistent -- Extreme swelling or bruising -- Continued bleeding from incision -- Increased pain, redness, or drainage from the incision -- Difficulty swallowing or breathing -- Muscle cramping or spasms -- Numbness or tingling in hands or around lips  The clinic staff is available to answer your questions during regular business hours.  Please don't hesitate to call and ask to speak to one of the nurses if you have concerns.  Armandina Gemma, MD [facility] Office: [phone]

## 2020-04-08 NOTE — Op Note (Signed)
OPERATIVE REPORT - PARATHYROIDECTOMY  Preoperative diagnosis: Primary hyperparathyroidism  Postop diagnosis: Same  Procedure: Left minimally invasive parathyroidectomy  Surgeon:  Armandina Gemma, MD  Anesthesia: General endotracheal  Estimated blood loss: Minimal  Preparation: ChloraPrep  Indications: Patient is referred by Dr. Glade Lloyd and Dr. Sallee Lange for surgical evaluation and management of newly diagnosed primary hyperparathyroidism. Patient has a history of hypercalcemia dating back approximately 10 years. Recent laboratory studies demonstrated a calcium level as high as 11.2. Intact PTH level was elevated at 124. 24-hour urine collection for calcium was elevated at 383. Vitamin D level was markedly low at 9.6. Patient has had complications including nephrolithiasis and chronic fatigue. Nuclear med parathyroid scan localized a left inferior adenoma.  Procedure: The patient was prepared in the pre-operative holding area. The patient was brought to the operating room and placed in a supine position on the operating room table. Following administration of general anesthesia, the patient was positioned and then prepped and draped in the usual strict aseptic fashion. After ascertaining that an adequate level of anesthesia been achieved, a neck incision was made with a #15 blade. Dissection was carried through subcutaneous tissues and platysma. Hemostasis was obtained with the electrocautery. Skin flaps were developed circumferentially and a Weitlander retractor was placed for exposure.  Strap muscles were incised in the midline. Strap muscles were reflected laterally exposing the thyroid lobe. With gentle blunt dissection the thyroid lobe was mobilized.  Dissection was carried through adipose tissue and an enlarged parathyroid gland was identified located relatively posteriorly along the edge of the esophagus.  The vascular supply originated from superiorly. It was gently mobilized.  Vascular structures were divided between medium ligaclips. Care was taken to avoid the recurrent laryngeal nerve and the esophagus. The parathyroid gland was completely excised. It was submitted to pathology where frozen section confirmed enlarged parathyroid tissue consistent with adenoma.  The neck was irrigated with warm saline and good hemostasis was noted. Fibrillar was placed in the operative field. Strap muscles were approximated in the midline with interrupted 3-0 Vicryl sutures. Platysma was closed with interrupted 3-0 Vicryl sutures. Marcaine was infiltrated circumferentially. Skin was closed with a running 4-0 Monocryl subcuticular suture. Wound was washed and dried and Dermabond was applied. Patient was awakened from anesthesia and brought to the recovery room. The patient tolerated the procedure well.   Armandina Gemma, MD [facility] Office: [phone]

## 2020-04-09 ENCOUNTER — Encounter (HOSPITAL_COMMUNITY): Payer: Self-pay | Admitting: Surgery

## 2020-04-09 LAB — SURGICAL PATHOLOGY

## 2020-04-12 DIAGNOSIS — E892 Postprocedural hypoparathyroidism: Secondary | ICD-10-CM | POA: Diagnosis not present

## 2020-04-22 ENCOUNTER — Ambulatory Visit: Payer: Medicaid Other | Admitting: Advanced Practice Midwife

## 2020-04-29 DIAGNOSIS — E892 Postprocedural hypoparathyroidism: Secondary | ICD-10-CM | POA: Diagnosis not present

## 2020-05-03 ENCOUNTER — Telehealth: Payer: Self-pay

## 2020-05-03 NOTE — Telephone Encounter (Signed)
Left patient a Vm - she needs an appt after her bloodwork since she has had her surgery

## 2020-05-20 ENCOUNTER — Encounter: Payer: Self-pay | Admitting: Advanced Practice Midwife

## 2020-05-20 ENCOUNTER — Other Ambulatory Visit: Payer: Self-pay

## 2020-05-20 ENCOUNTER — Ambulatory Visit (INDEPENDENT_AMBULATORY_CARE_PROVIDER_SITE_OTHER): Payer: Medicaid Other | Admitting: Advanced Practice Midwife

## 2020-05-20 ENCOUNTER — Other Ambulatory Visit (HOSPITAL_COMMUNITY)
Admission: RE | Admit: 2020-05-20 | Discharge: 2020-05-20 | Disposition: A | Discharge status: Home / Self Care | Payer: Medicaid Other | Source: Ambulatory Visit | Attending: Advanced Practice Midwife | Admitting: Advanced Practice Midwife

## 2020-05-20 VITALS — BP 117/73 | HR 95 | Ht 64.0 in | Wt 256.0 lb

## 2020-05-20 DIAGNOSIS — E210 Primary hyperparathyroidism: Secondary | ICD-10-CM | POA: Diagnosis not present

## 2020-05-20 DIAGNOSIS — Z01419 Encounter for gynecological examination (general) (routine) without abnormal findings: Secondary | ICD-10-CM | POA: Diagnosis not present

## 2020-05-20 DIAGNOSIS — Z113 Encounter for screening for infections with a predominantly sexual mode of transmission: Secondary | ICD-10-CM | POA: Diagnosis not present

## 2020-05-20 NOTE — Progress Notes (Signed)
Brandi Dunn 40 y.o.  Vitals:   05/20/20 1535  BP: 117/73  Pulse: 95     Filed Weights   05/20/20 1535  Weight: 256 lb (116.1 kg)    Past Medical History: Past Medical History:  Diagnosis Date  . GERD (gastroesophageal reflux disease)   . H/O renal calculi   . History of kidney stones   . Kidney stones   . Migraine 02/2020   heat triggered  . 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 CHOLECYSTECTOMY  04/12/2011  . CYSTOSCOPY W/ URETERAL STENT PLACEMENT Left 09/10/2012   Procedure: CYSTOSCOPY WITH LEFT RETROGRADE PYELOGRAM; LEFT URETERAL STENT PLACEMENT;  Surgeon: Marissa Nestle, MD;  Location: [facility];  Service: Urology;  Laterality: Left;  . DILATION AND CURETTAGE OF UTERUS  2000?   x2, [facility]  . PARATHYROIDECTOMY Left 04/08/2020   Procedure: LEFT INFERIOR PARATHYROIDECTOMY;  Surgeon: Armandina Gemma, MD;  Location: [facility];  Service: General;  Laterality: Left;  LDOW ROOM 4  90 MIN  . STONE EXTRACTION WITH BASKET Left 09/10/2012   Procedure: BALLOON DILATATION LEFT URETER; LEFT URETEROSCOPIC STONE EXTRACTION WITH BASKET;  Surgeon: Marissa Nestle, MD;  Location: [facility];  Service: Urology;  Laterality: Left;    Family History: Family History  Problem Relation Age of Onset  . Hypertension Mother   . Hyperlipidemia Mother   . Hypertension Father   . Anesthesia problems Neg Hx   . Hypotension Neg Hx   . Malignant hyperthermia Neg Hx   . Pseudochol deficiency Neg Hx     Social History: Social History   Tobacco Use  . Smoking status: Never Smoker  . Smokeless tobacco: Never Used  Vaping Use  . Vaping Use: Never used  Substance Use Topics  . Alcohol use: No    Alcohol/week: 0.0 standard drinks  . Drug use: No    Allergies: No Known Allergies    Current Outpatient Medications:  .  levonorgestrel (MIRENA) 20 MCG/24HR IUD, 1 each by Intrauterine route once., Disp: , Rfl:  .  Vitamin D,  Ergocalciferol, (DRISDOL) 1.25 MG (50000 UNIT) CAPS capsule, TAKE 1 CAPSULE BY MOUTH EVERY 7 DAYS (Patient taking differently: Take 50,000 Units by mouth every Monday.), Disp: 12 capsule, Rfl: 0 .  traMADol (ULTRAM) 50 MG tablet, Take 1-2 tablets (50-100 mg total) by mouth every 6 (six) hours as needed for moderate pain. (Patient not taking: Reported on 05/20/2020), Disp: 15 tablet, Rfl: 0  History of Present Illness: here for physical.  Last pap 03/13/18, normal.  Has Mirnea IUD since 2017, rarely bleeds.  Had parathyroidectomy a month ago, still hoarse . Had irregularities in right breast, has a marker, but deemed normal . .     Review of Systems   Patient denies any headaches, blurred vision, shortness of breath, chest pain, abdominal pain, problems with bowel movements, urination, or intercourse.   Physical Exam: General:  Well developed, well nourished, no acute distress Skin:  Warm and dry Neck:  Midline trachea, normal thyroid Lungs; Clear to auscultation bilaterally Breast:  No dominant palpable mass, retraction, or nipple discharge Cardiovascular: Regular rate and rhythm Abdomen:  Soft, non tender, no hepatosplenomegaly Pelvic:  External genitalia is normal in appearance.  The vagina is normal in appearance.  The cervix is bulbous. IUD strings visible. Uterus is felt to be normal size, shape, and contour.  No adnexal masses or tenderness noted.  Extremities:  No  swelling or varicosities noted Psych:  No mood changes.     Impression/Plan: Normal GYN exam Pap due next year Mirena has 2 more years ,but if BTB increases, can switch it out Mammogram this fall

## 2020-05-21 LAB — CERVICOVAGINAL ANCILLARY ONLY
Chlamydia: NEGATIVE
Comment: NEGATIVE
Comment: NORMAL
Neisseria Gonorrhea: NEGATIVE

## 2020-05-26 ENCOUNTER — Other Ambulatory Visit: Payer: Self-pay | Admitting: *Deleted

## 2020-05-26 DIAGNOSIS — K862 Cyst of pancreas: Secondary | ICD-10-CM

## 2020-05-28 ENCOUNTER — Other Ambulatory Visit: Payer: Self-pay | Admitting: "Endocrinology

## 2020-06-17 ENCOUNTER — Other Ambulatory Visit: Payer: Self-pay

## 2020-06-17 ENCOUNTER — Ambulatory Visit: Payer: Medicaid Other | Admitting: Family Medicine

## 2020-06-17 ENCOUNTER — Encounter: Payer: Self-pay | Admitting: Family Medicine

## 2020-06-17 VITALS — BP 112/74 | HR 92 | Temp 97.5°F | Wt 256.0 lb

## 2020-06-17 DIAGNOSIS — F411 Generalized anxiety disorder: Secondary | ICD-10-CM

## 2020-06-17 MED ORDER — ESCITALOPRAM OXALATE 5 MG PO TABS: 5.0000 mg | ORAL_TABLET | Freq: Every day | ORAL | 1 refills | Status: DC

## 2020-06-17 NOTE — Patient Instructions (Addendum)
Escitalopram Tablets What is this medicine? ESCITALOPRAM (es sye TAL oh pram) is used to treat depression and certain types of anxiety. This medicine may be used for other purposes; ask your health care provider or pharmacist if you have questions. COMMON BRAND NAME(S): Lexapro What should I tell my health care provider before I take this medicine? They need to know if you have any of these conditions:  bipolar disorder or a family history of bipolar disorder  diabetes  glaucoma  heart disease  kidney or liver disease  receiving electroconvulsive therapy  seizures (convulsions)  suicidal thoughts, plans, or attempt by you or a family member  an unusual or allergic reaction to escitalopram, the related drug citalopram, other medicines, foods, dyes, or preservatives  pregnant or trying to become pregnant  breast-feeding How should I use this medicine? Take this medicine by mouth with a glass of water. Follow the directions on the prescription label. You can take it with or without food. If it upsets your stomach, take it with food. Take your medicine at regular intervals. Do not take it more often than directed. Do not stop taking this medicine suddenly except upon the advice of your doctor. Stopping this medicine too quickly may cause serious side effects or your condition may worsen. A special MedGuide will be given to you by the pharmacist with each prescription and refill. Be sure to read this information carefully each time. Talk to your pediatrician regarding the use of this medicine in children. Special care may be needed. Overdosage: If you think you have taken too much of this medicine contact a poison control center or emergency room at once. NOTE: This medicine is only for you. Do not share this medicine with others. What if I miss a dose? If you miss a dose, take it as soon as you can. If it is almost time for your next dose, take only that dose. Do not take double or  extra doses. What may interact with this medicine? Do not take this medicine with any of the following medications:  certain medicines for fungal infections like fluconazole, itraconazole, ketoconazole, posaconazole, voriconazole  cisapride  citalopram  dronedarone  linezolid  MAOIs like Carbex, Eldepryl, Marplan, Nardil, and Parnate  methylene blue (injected into a vein)  pimozide  thioridazine This medicine may also interact with the following medications:  alcohol  amphetamines  aspirin and aspirin-like medicines  carbamazepine  certain medicines for depression, anxiety, or psychotic disturbances  certain medicines for migraine headache like almotriptan, eletriptan, frovatriptan, naratriptan, rizatriptan, sumatriptan, zolmitriptan  certain medicines for sleep  certain medicines that treat or prevent blood clots like warfarin, enoxaparin, dalteparin  cimetidine  diuretics  dofetilide  fentanyl  furazolidone  isoniazid  lithium  metoprolol  NSAIDs, medicines for pain and inflammation, like ibuprofen or naproxen  other medicines that prolong the QT interval (cause an abnormal heart rhythm)  procarbazine  rasagiline  supplements like St. John's wort, kava kava, valerian  tramadol  tryptophan  ziprasidone This list may not describe all possible interactions. Give your health care provider a list of all the medicines, herbs, non-prescription drugs, or dietary supplements you use. Also tell them if you smoke, drink alcohol, or use illegal drugs. Some items may interact with your medicine. What should I watch for while using this medicine? Tell your doctor if your symptoms do not get better or if they get worse. Visit your doctor or health care professional for regular checks on your progress. Because it may   take several weeks to see the full effects of this medicine, it is important to continue your treatment as prescribed by your doctor. Patients  and their families should watch out for new or worsening thoughts of suicide or depression. Also watch out for sudden changes in feelings such as feeling anxious, agitated, panicky, irritable, hostile, aggressive, impulsive, severely restless, overly excited and hyperactive, or not being able to sleep. If this happens, especially at the beginning of treatment or after a change in dose, call your health care professional. Dennis Bast may get drowsy or dizzy. Do not drive, use machinery, or do anything that needs mental alertness until you know how this medicine affects you. Do not stand or sit up quickly, especially if you are an older patient. This reduces the risk of dizzy or fainting spells. Alcohol may interfere with the effect of this medicine. Avoid alcoholic drinks. Your mouth may get dry. Chewing sugarless gum or sucking hard candy, and drinking plenty of water may help. Contact your doctor if the problem does not go away or is severe. What side effects may I notice from receiving this medicine? Side effects that you should report to your doctor or health care professional as soon as possible:  allergic reactions like skin rash, itching or hives, swelling of the face, lips, or tongue  anxious  black, tarry stools  changes in vision  confusion  elevated mood, decreased need for sleep, racing thoughts, impulsive behavior  eye pain  fast, irregular heartbeat  feeling faint or lightheaded, falls  feeling agitated, angry, or irritable  hallucination, loss of contact with reality  loss of balance or coordination  loss of memory  painful or prolonged erections  restlessness, pacing, inability to keep still  seizures  stiff muscles  suicidal thoughts or other mood changes  trouble sleeping  unusual bleeding or bruising  unusually weak or tired  vomiting Side effects that usually do not require medical attention (report to your doctor or health care professional if they  continue or are bothersome):  changes in appetite  change in sex drive or performance  headache  increased sweating  indigestion, nausea  tremors This list may not describe all possible side effects. Call your doctor for medical advice about side effects. You may report side effects to FDA at 1-800-FDA-1088. Where should I keep my medicine? Keep out of reach of children. Store at room temperature between 15 and 30 degrees C (59 and 86 degrees F). Throw away any unused medicine after the expiration date. NOTE: This sheet is a summary. It may not cover all possible information. If you have questions about this medicine, talk to your doctor, pharmacist, or health care provider.  2021 Elsevier/Gold Standard (2019-12-15 09:53:34) Insomnia Insomnia is a sleep disorder that makes it difficult to fall asleep or stay asleep. Insomnia can cause fatigue, low energy, difficulty concentrating, mood swings, and poor performance at work or school. There are three different ways to classify insomnia:  Difficulty falling asleep.  Difficulty staying asleep.  Waking up too early in the morning. Any type of insomnia can be long-term (chronic) or short-term (acute). Both are common. Short-term insomnia usually lasts for three months or less. Chronic insomnia occurs at least three times a week for longer than three months. What are the causes? Insomnia may be caused by another condition, situation, or substance, such as:  Anxiety.  Certain medicines.  Gastroesophageal reflux disease (GERD) or other gastrointestinal conditions.  Asthma or other breathing conditions.  Restless legs  syndrome, sleep apnea, or other sleep disorders.  Chronic pain.  Menopause.  Stroke.  Abuse of alcohol, tobacco, or illegal drugs.  Mental health conditions, such as depression.  Caffeine.  Neurological disorders, such as Alzheimer's disease.  An overactive thyroid (hyperthyroidism). Sometimes, the cause  of insomnia may not be known. What increases the risk? Risk factors for insomnia include:  Gender. Women are affected more often than men.  Age. Insomnia is more common as you get older.  Stress.  Lack of exercise.  Irregular work schedule or working night shifts.  Traveling between different time zones.  Certain medical and mental health conditions. What are the signs or symptoms? If you have insomnia, the main symptom is having trouble falling asleep or having trouble staying asleep. This may lead to other symptoms, such as:  Feeling fatigued or having low energy.  Feeling nervous about going to sleep.  Not feeling rested in the morning.  Having trouble concentrating.  Feeling irritable, anxious, or depressed. How is this diagnosed? This condition may be diagnosed based on:  Your symptoms and medical history. Your health care provider may ask about: ? Your sleep habits. ? Any medical conditions you have. ? Your mental health.  A physical exam. How is this treated? Treatment for insomnia depends on the cause. Treatment may focus on treating an underlying condition that is causing insomnia. Treatment may also include:  Medicines to help you sleep.  Counseling or therapy.  Lifestyle adjustments to help you sleep better. Follow these instructions at home: Eating and drinking  Limit or avoid alcohol, caffeinated beverages, and cigarettes, especially close to bedtime. These can disrupt your sleep.  Do not eat a large meal or eat spicy foods right before bedtime. This can lead to digestive discomfort that can make it hard for you to sleep.   Sleep habits  Keep a sleep diary to help you and your health care provider figure out what could be causing your insomnia. Write down: ? When you sleep. ? When you wake up during the night. ? How well you sleep. ? How rested you feel the next day. ? Any side effects of medicines you are taking. ? What you eat and  drink.  Make your bedroom a dark, comfortable place where it is easy to fall asleep. ? Put up shades or blackout curtains to block light from outside. ? Use a white noise machine to block noise. ? Keep the temperature cool.  Limit screen use before bedtime. This includes: ? Watching TV. ? Using your smartphone, tablet, or computer.  Stick to a routine that includes going to bed and waking up at the same times every day and night. This can help you fall asleep faster. Consider making a quiet activity, such as reading, part of your nighttime routine.  Try to avoid taking naps during the day so that you sleep better at night.  Get out of bed if you are still awake after 15 minutes of trying to sleep. Keep the lights down, but try reading or doing a quiet activity. When you feel sleepy, go back to bed.   General instructions  Take over-the-counter and prescription medicines only as told by your health care provider.  Exercise regularly, as told by your health care provider. Avoid exercise starting several hours before bedtime.  Use relaxation techniques to manage stress. Ask your health care provider to suggest some techniques that may work well for you. These may include: ? Breathing exercises. ? Routines to release  muscle tension. ? Visualizing peaceful scenes.  Make sure that you drive carefully. Avoid driving if you feel very sleepy.  Keep all follow-up visits as told by your health care provider. This is important. Contact a health care provider if:  You are tired throughout the day.  You have trouble in your daily routine due to sleepiness.  You continue to have sleep problems, or your sleep problems get worse. Get help right away if:  You have serious thoughts about hurting yourself or someone else. If you ever feel like you may hurt yourself or others, or have thoughts about taking your own life, get help right away. You can go to your nearest emergency department or  call:  Your local emergency services (911 in the [place]  A suicide crisis helpline, such as the [place] [facility] at 254-839-3613. This is open 24 hours a day. Summary  Insomnia is a sleep disorder that makes it difficult to fall asleep or stay asleep.  Insomnia can be long-term (chronic) or short-term (acute).  Treatment for insomnia depends on the cause. Treatment may focus on treating an underlying condition that is causing insomnia.  Keep a sleep diary to help you and your health care provider figure out what could be causing your insomnia. This information is not intended to replace advice given to you by your health care provider. Make sure you discuss any questions you have with your health care provider. Document Revised: 12/04/2019 Document Reviewed: 12/04/2019 Elsevier Patient Education  2021 [employer].

## 2020-06-17 NOTE — Progress Notes (Signed)
Patient ID: Brandi Dunn, female    DOB: January 19, 1981, 40 y.o.   MRN: [id number]   Chief Complaint  Patient presents with  . Stress   Subjective:  CC: stress and sleep problems   This is not a new problem.  Presents today to discuss stress, anxiety, and issues with insomnia.  She reports that she is not depressed, does endorse worry, mostly about other family members in which she has no control over.  Reports that she sleeps about 5 hours a night.  Has taken Ativan in the past without significant help.  Reports that she hears everything when she is trying to sleep.  Reports other over-the-counter medications such as melatonin have not helped.  Denies fever, chills, fatigue or unexpected weight gain or loss.  Endorses continued hoarseness due to recent parathyroid surgery.  Denies chest pain or shortness of breath.   Pt having issues with stress and not being able to sleep. Pt has spoke with Dr.Luking and put her on a sleeping med (ativan back in September 2021) but that did not help. Pt states she hears every little thing when she is asleep. Pt has tried OTC med but no help.   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   Outpatient Encounter Medications as of 06/17/2020  Medication Sig  . escitalopram (LEXAPRO) 5 MG tablet Take 1 tablet (5 mg total) by mouth daily.  Marland Kitchen levonorgestrel (MIRENA) 20 MCG/24HR IUD 1 each by Intrauterine route once.  . Vitamin D, Ergocalciferol, (DRISDOL) 1.25 MG (50000 UNIT) CAPS capsule TAKE 1 CAPSULE BY MOUTH EVERY 7 DAYS  . [DISCONTINUED] traMADol (ULTRAM) 50 MG tablet Take 1-2 tablets (50-100 mg total) by mouth every 6 (six) hours as needed for moderate pain. (Patient not taking: Reported on 05/20/2020)   No facility-administered encounter medications on file as of 06/17/2020.      Review of Systems  Constitutional: Negative for chills, fatigue, fever and unexpected weight change.  HENT: Positive for voice change (had parathyroid surgery on 04/08/20).   Respiratory: Negative for shortness of breath.   Cardiovascular: Negative for chest pain and palpitations.  Endocrine: Negative for cold intolerance, heat intolerance, polydipsia and polyuria.  Neurological: Negative for tremors.  Psychiatric/Behavioral: Positive for sleep disturbance. Negative for agitation, self-injury and suicidal ideas. The patient is nervous/anxious.      Vitals BP 112/74   Pulse 92   Temp (!) 97.5 F (36.4 C)   Wt 256 lb (116.1 kg)   SpO2 99%   BMI 43.94 kg/m   Objective:   Physical Exam Vitals reviewed.  Constitutional:      Appearance: Normal appearance.  Cardiovascular:     Rate and Rhythm: Normal rate and regular rhythm.     Heart sounds: Normal heart sounds.  Pulmonary:     Effort: Pulmonary effort is normal.     Breath sounds: Normal breath sounds.  Skin:    General: Skin is warm and dry.  Neurological:     General: No focal deficit present.     Mental Status: She is alert.  Psychiatric:        Behavior: Behavior normal.      Assessment and Plan   1. GAD (generalized anxiety disorder) - TSH - escitalopram (LEXAPRO) 5 MG tablet; Take 1 tablet (5 mg total) by mouth daily.  Dispense: 30 tablet; Refill: 1 -  Ambulatory referral to Psychology   Will get TSH today to ensure thyroid function is normal.  Will start Lexapro 5 mg daily for excessive worry.  Understands this medication drug class can take several weeks to become therapeutic.  Discussed avoiding high risk for addiction medications such as benzodiazepines and sleep medications.  Recommend over-the-counter supplements, white noise in the background, meditation.  Recommend cognitive behavioral therapy.  Agrees with plan of care discussed today. Understands warning signs to seek further care: chest pain,  shortness of breath, any significant change in health.  Understands to follow-up in one month with PCP, Dr. Wolfgang Phoenix to assess effectiveness of SSRI. Will notify once results of TSH are available.     Chalmers Guest, NP 06/17/2020

## 2020-06-18 LAB — TSH: TSH: 1.71 u[IU]/mL (ref 0.450–4.500)

## 2020-06-23 ENCOUNTER — Other Ambulatory Visit: Payer: Self-pay

## 2020-06-23 ENCOUNTER — Ambulatory Visit (HOSPITAL_COMMUNITY)
Admission: RE | Admit: 2020-06-23 | Discharge: 2020-06-23 | Disposition: A | Discharge status: Home / Self Care | Payer: Medicaid Other | Source: Ambulatory Visit | Attending: Family Medicine | Admitting: Family Medicine

## 2020-06-23 ENCOUNTER — Other Ambulatory Visit: Payer: Self-pay | Admitting: Family Medicine

## 2020-06-23 DIAGNOSIS — K862 Cyst of pancreas: Secondary | ICD-10-CM

## 2020-06-23 IMAGING — MR MR ABDOMEN WO/W CM
21 of 23 series · 44 of 48 positions shown · IV contrast (10 ml Gadavist)
Comparison: [DATE]

CLINICAL DATA: Follow-up cystic pancreatic lesion.

EXAM:
MRI ABDOMEN WITHOUT AND WITH CONTRAST
TECHNIQUE: Multiplanar multisequence MR imaging of the abdomen was performed
both before and after the administration of intravenous contrast.
CONTRAST:  10mL GADAVIST GADOBUTROL 1 MMOL/ML IV SOLN

[Series 3: cor haste · coronal · 6.0mm · 1.25mm/px · 1 of 26 slices shown]
[im 1/26]
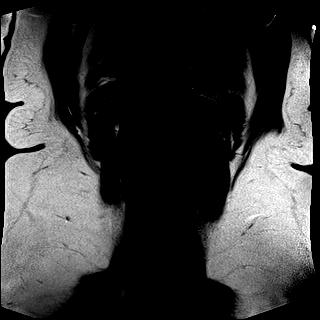

[Series 6: T2 fat-sat · axial · 6.0mm · 1.34mm/px · 1 of 32 slices shown]
[im 1/32]
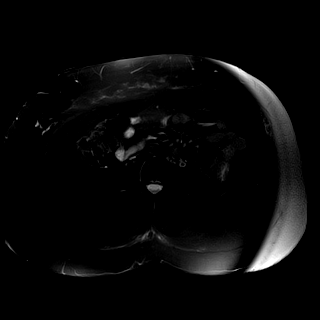

[Series 7: DWI · axial · 6.0mm · 1.68mm/px · 1 of 30 slices shown (1 of 4)]
[im 1/30]
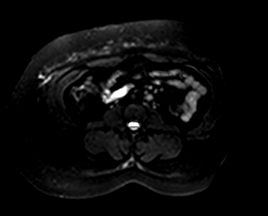

[Series 7: DWI · axial · 6.0mm · 1.68mm/px · 1 of 30 slices shown (2 of 4)]
[im 1/30]
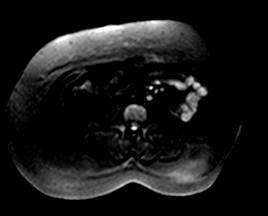

[Series 7: DWI · axial · 6.0mm · 1.68mm/px · 1 of 30 slices shown (3 of 4)]
[im 1/30]
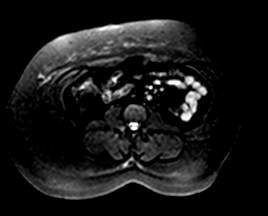

[Series 8: DWI · axial · 6.0mm · 1.68mm/px · 1 of 30 slices shown (4 of 4)]
[im 1/30]
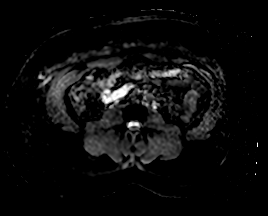

[Series 9: ax in and · axial · 3.0mm · 1.34mm/px · z∈[-55,+182]mm · 2 of 80 slices shown (1 of 2)]
[im 1/80]
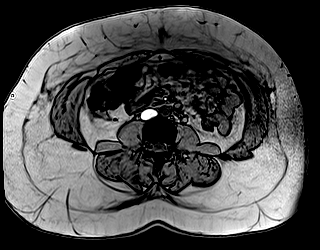
[im 80/80]
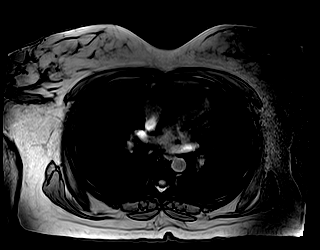

[Series 10: ax in and · axial · 3.0mm · 1.34mm/px · z∈[-55,+182]mm · 3 of 80 slices shown (2 of 2)]
[im 1/80]
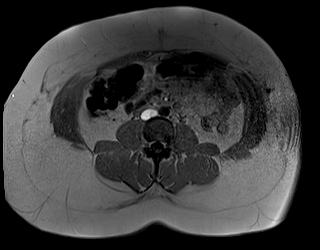
[im 40/80]
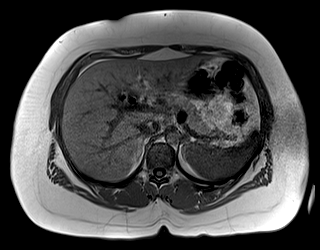
[im 80/80]
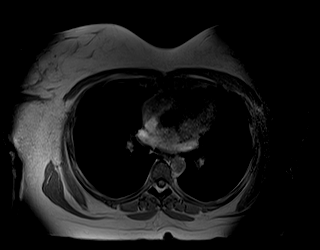

[Series 11: MRCP · coronal · 50.0mm · 0.78mm/px · 1 of 5 slices shown (1 of 2)]
[im 1/5]
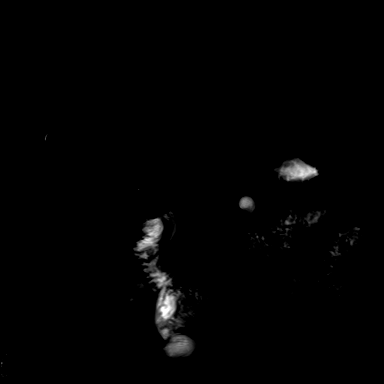

[Series 15: MRCP · coronal · 4.0mm · 1.12mm/px · 1 of 15 slices shown (2 of 2)]
[im 1/15]
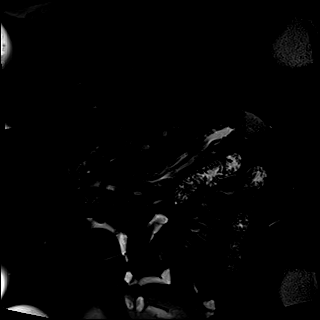

[Series 16: ax haste bh · axial · 6.0mm · 1.34mm/px · 1 of 36 slices shown]
[im 1/36]
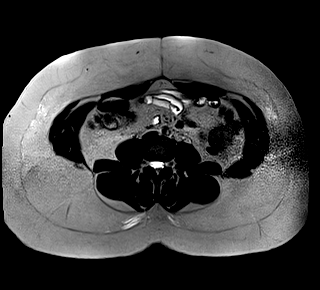

[Series 17: T1 dynamic · axial · 3.0mm · 1.34mm/px · z∈[-55,+182]mm · 3 of 80 slices shown (1 of 7)]
[im 1/80]
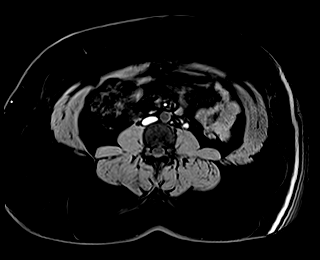
[im 40/80]
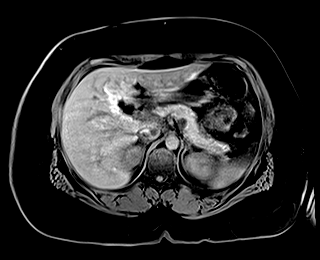
[im 80/80]
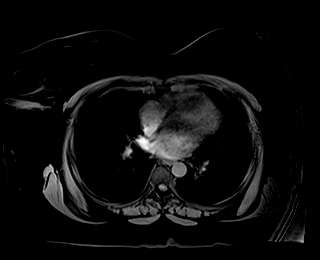

[Series 19: T1 dynamic · axial · 3.0mm · 1.34mm/px · z∈[-55,+182]mm · 3 of 80 slices shown (2 of 7)]
[im 1/80]
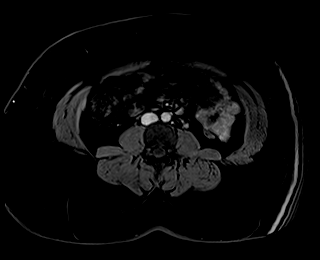
[im 40/80]
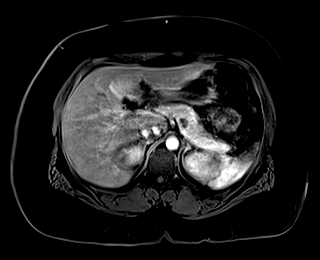
[im 80/80]
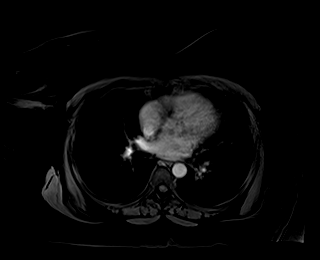

[Series 20: T1 dynamic · axial · 3.0mm · 1.34mm/px · z∈[-55,+182]mm · 3 of 80 slices shown (3 of 7)]
[im 1/80]
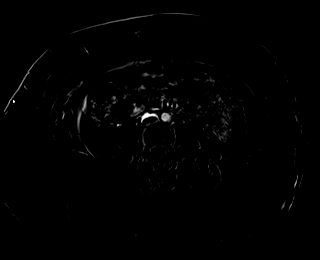
[im 40/80]
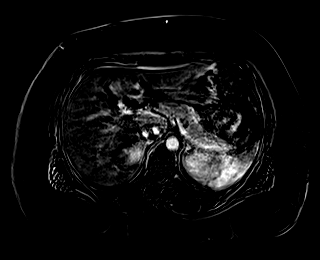
[im 80/80]
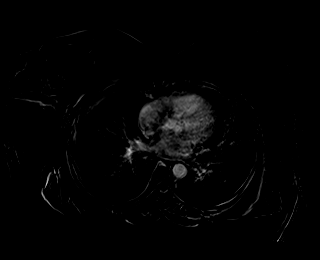

[Series 21: T1 dynamic · axial · 3.0mm · 1.34mm/px · z∈[-55,+182]mm · 3 of 80 slices shown (4 of 7)]
[im 1/80]
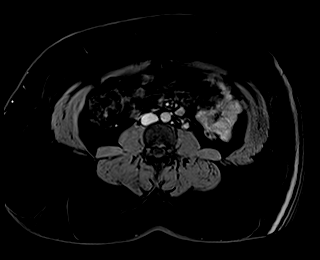
[im 40/80]
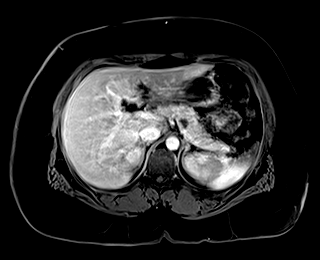
[im 80/80]
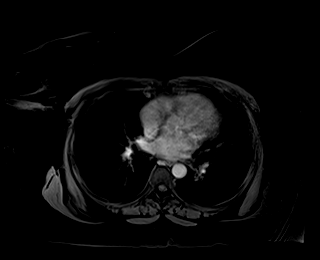

[Series 22: T1 dynamic · axial · 3.0mm · 1.34mm/px · z∈[-55,+182]mm · 3 of 80 slices shown (5 of 7)]
[im 1/80]
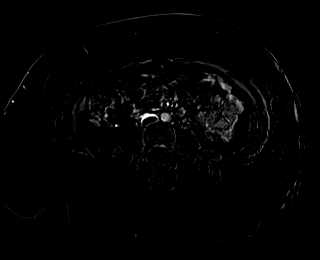
[im 40/80]
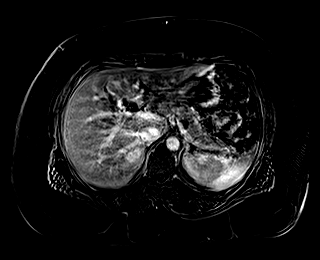
[im 80/80]
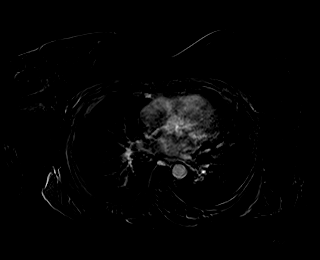

[Series 23: T1 dynamic · axial · 3.0mm · 1.34mm/px · z∈[-55,+182]mm · 3 of 80 slices shown (6 of 7)]
[im 1/80]
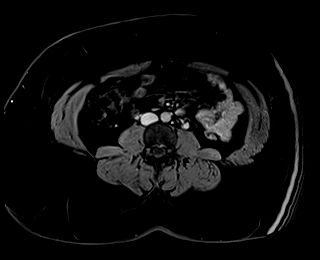
[im 40/80]
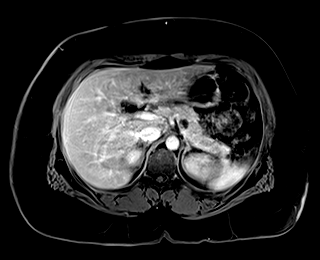
[im 80/80]
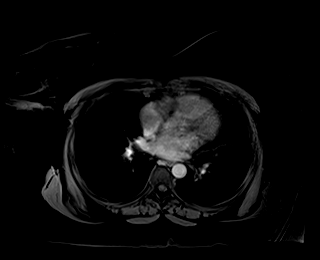

[Series 24: T1 dynamic · axial · 3.0mm · 1.34mm/px · z∈[-55,+182]mm · 3 of 80 slices shown (7 of 7)]
[im 1/80]
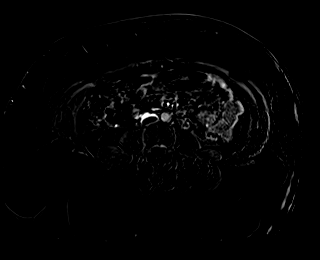
[im 40/80]
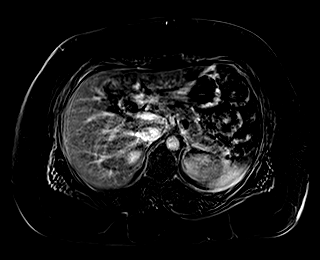
[im 80/80]
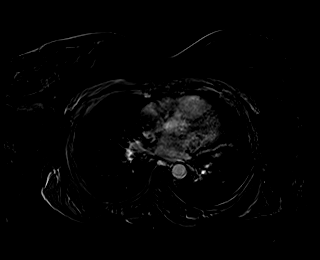

[Series 25: T1 dynamic post-contrast · coronal · 3.0mm · 1.31mm/px · 3 of 72 slices shown (1 of 3)]
[im 1/72]
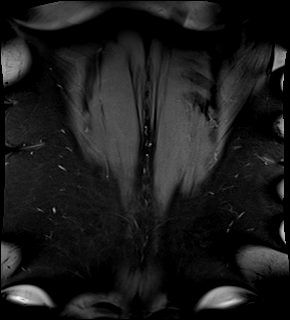
[im 36/72]
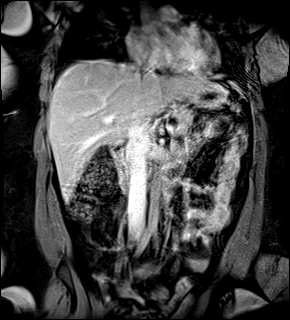
[im 72/72]
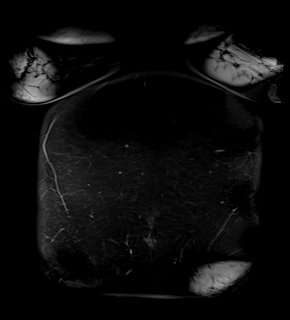

[Series 26: T1 dynamic post-contrast · axial · 3.0mm · 1.34mm/px · z∈[-55,+182]mm · 3 of 80 slices shown (2 of 3)]
[im 1/80]
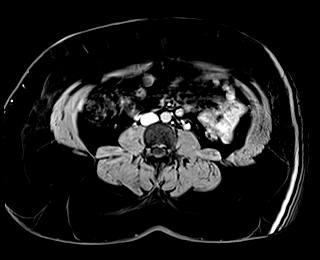
[im 40/80]
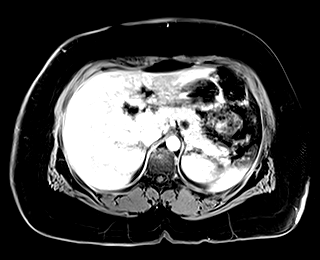
[im 80/80]
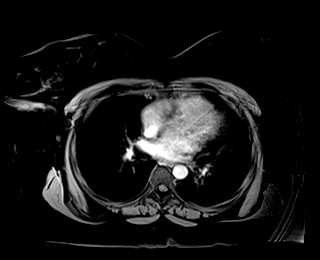

[Series 27: T1 dynamic post-contrast · axial · 3.0mm · 1.34mm/px · z∈[-55,+182]mm · 3 of 80 slices shown (3 of 3)]
[im 1/80]
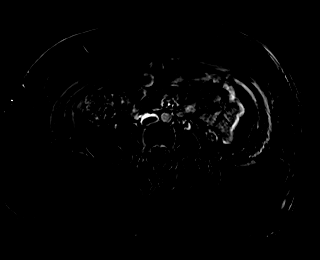
[im 40/80]
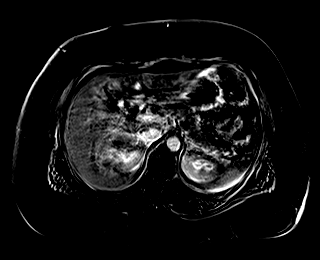
[im 80/80]
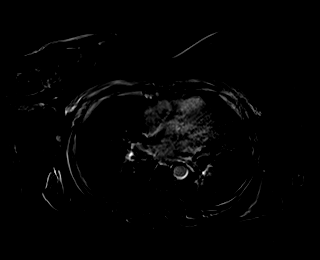

[44 of 48 positions shown; findings below may reference images not displayed]

FINDINGS: Lower chest: No acute findings.

Hepatobiliary: No hepatic masses identified. Prior cholecystectomy.
No evidence of biliary obstruction.

Pancreas: A 1.7 cm simple appearing cyst is again seen along the
posterior aspect of the pancreatic body, and is stable in size and
appearance since previous study. No other pancreatic lesions
identified. No evidence of pancreatic ductal dilatation.

Spleen:  Within normal limits in size and appearance.

Adrenals/Urinary Tract: No masses identified. No evidence of
hydronephrosis.

Stomach/Bowel: Visualized portion unremarkable.

Vascular/Lymphatic: No pathologically enlarged lymph nodes
identified. No abdominal aortic aneurysm.

Other:  None.

Musculoskeletal:  No suspicious bone lesions identified.
IMPRESSION: Stable 1.7 cm simple appearing cyst in the pancreatic body, most
likely representing an indolent cystic neoplasm such as a
side-branch IPMN. Recommend continued follow-up by MRI in 6 months.
This recommendation follows ACR consensus guidelines: Management of
Incidental Pancreatic Cysts: A White Paper of the ACR Incidental
Findings Committee. [HOSPITAL] [VE];[DATE].

## 2020-06-23 MED ORDER — GADOBUTROL 1 MMOL/ML IV SOLN
10.0000 mL | Freq: Once | INTRAVENOUS | Status: AC | PRN
  Administered 2020-06-23: 10 mL via INTRAVENOUS

## 2020-07-07 ENCOUNTER — Other Ambulatory Visit: Payer: Self-pay | Admitting: "Endocrinology

## 2020-07-12 ENCOUNTER — Ambulatory Visit (INDEPENDENT_AMBULATORY_CARE_PROVIDER_SITE_OTHER): Payer: Medicaid Other | Admitting: Psychology

## 2020-07-12 DIAGNOSIS — F418 Other specified anxiety disorders: Secondary | ICD-10-CM

## 2020-07-20 ENCOUNTER — Ambulatory Visit: Payer: Medicaid Other | Admitting: Family Medicine

## 2020-07-30 ENCOUNTER — Ambulatory Visit: Payer: Medicaid Other | Admitting: Psychology

## 2020-08-18 ENCOUNTER — Encounter: Payer: Self-pay | Admitting: Family Medicine

## 2020-08-18 ENCOUNTER — Telehealth: Payer: Medicaid Other | Admitting: Family Medicine

## 2020-08-18 ENCOUNTER — Telehealth: Payer: Self-pay | Admitting: Family Medicine

## 2020-08-18 DIAGNOSIS — R300 Dysuria: Secondary | ICD-10-CM

## 2020-08-18 MED ORDER — NITROFURANTOIN MONOHYD MACRO 100 MG PO CAPS: 100.0000 mg | ORAL_CAPSULE | Freq: Two times a day (BID) | ORAL | 0 refills | Status: AC

## 2020-08-18 NOTE — Telephone Encounter (Signed)
Pt states that she might have a uti and was wondering about an app

## 2020-08-18 NOTE — Patient Instructions (Signed)
Urinary Tract Infection, Adult A urinary tract infection (UTI) is an infection of any part of the urinary tract. The urinary tract includes the kidneys, ureters, bladder, and urethra. These organs make, store, and get rid of urine in the body. An upper UTI affects the ureters and kidneys. A lower UTI affects the bladder and urethra. What are the causes? Most urinary tract infections are caused by bacteria in your genital area around your urethra, where urine leaves your body. These bacteria grow and cause inflammation of your urinary tract. What increases the risk? You are more likely to develop this condition if: You have a urinary catheter that stays in place. You are not able to control when you urinate or have a bowel movement (incontinence). You are female and you: Use a spermicide or diaphragm for birth control. Have low estrogen levels. Are pregnant. You have certain genes that increase your risk. You are sexually active. You take antibiotic medicines. You have a condition that causes your flow of urine to slow down, such as: An enlarged prostate, if you are female. Blockage in your urethra. A kidney stone. A nerve condition that affects your bladder control (neurogenic bladder). Not getting enough to drink, or not urinating often. You have certain medical conditions, such as: Diabetes. A weak disease-fighting system (immunesystem). Sickle cell disease. Gout. Spinal cord injury. What are the signs or symptoms? Symptoms of this condition include: Needing to urinate right away (urgency). Frequent urination. This may include small amounts of urine each time you urinate. Pain or burning with urination. Blood in the urine. Urine that smells bad or unusual. Trouble urinating. Cloudy urine. Vaginal discharge, if you are female. Pain in the abdomen or the lower back. You may also have: Vomiting or a decreased appetite. Confusion. Irritability or tiredness. A fever or  chills. Diarrhea. The first symptom in older adults may be confusion. In some cases, they may not have any symptoms until the infection has worsened. How is this diagnosed? This condition is diagnosed based on your medical history and a physical exam. You may also have other tests, including: Urine tests. Blood tests. Tests for STIs (sexually transmitted infections). If you have had more than one UTI, a cystoscopy or imaging studies may be done to determine the cause of the infections. How is this treated? Treatment for this condition includes: Antibiotic medicine. Over-the-counter medicines to treat discomfort. Drinking enough water to stay hydrated. If you have frequent infections or have other conditions such as a kidney stone, you may need to see a health care provider who specializes in the urinary tract (urologist). In rare cases, urinary tract infections can cause sepsis. Sepsis is a life-threatening condition that occurs when the body responds to an infection. Sepsis is treated in the hospital with IV antibiotics, fluids, and other medicines. Follow these instructions at home: Medicines Take over-the-counter and prescription medicines only as told by your health care provider. If you were prescribed an antibiotic medicine, take it as told by your health care provider. Do not stop using the antibiotic even if you start to feel better. General instructions Make sure you: Empty your bladder often and completely. Do not hold urine for long periods of time. Empty your bladder after sex. Wipe from front to back after urinating or having a bowel movement if you are female. Use each tissue only one time when you wipe. Drink enough fluid to keep your urine pale yellow. Keep all follow-up visits. This is important. Contact a health care provider   if: Your symptoms do not get better after 1-2 days. Your symptoms go away and then return. Get help right away if: You have severe pain in your  back or your lower abdomen. You have a fever or chills. You have nausea or vomiting. Summary A urinary tract infection (UTI) is an infection of any part of the urinary tract, which includes the kidneys, ureters, bladder, and urethra. Most urinary tract infections are caused by bacteria in your genital area. Treatment for this condition often includes antibiotic medicines. If you were prescribed an antibiotic medicine, take it as told by your health care provider. Do not stop using the antibiotic even if you start to feel better. Keep all follow-up visits. This is important. This information is not intended to replace advice given to you by your health care provider. Make sure you discuss any questions you have with your health care provider. Document Revised: 09/05/2019 Document Reviewed: 09/05/2019 Elsevier Patient Education  2022 Elsevier Inc.  

## 2020-08-18 NOTE — Progress Notes (Signed)
Brandi Dunn, perlow are scheduled for a virtual visit with your provider today.    Just as we do with appointments in the office, we must obtain your consent to participate.  Your consent will be active for this visit and any virtual visit you may have with one of our providers in the next 365 days.    If you have a MyChart account, I can also send a copy of this consent to you electronically.  All virtual visits are billed to your insurance company just like a traditional visit in the office.  As this is a virtual visit, video technology does not allow for your provider to perform a traditional examination.  This may limit your provider's ability to fully assess your condition.  If your provider identifies any concerns that need to be evaluated in person or the need to arrange testing such as labs, EKG, etc, we will make arrangements to do so.    Although advances in technology are sophisticated, we cannot ensure that it will always work on either your end or our end.  If the connection with a video visit is poor, we may have to switch to a telephone visit.  With either a video or telephone visit, we are not always able to ensure that we have a secure connection.   I need to obtain your verbal consent now.   Are you willing to proceed with your visit today?   Brandi Dunn has provided verbal consent on 08/18/2020 for a virtual visit (video or telephone).   Perlie Mayo, NP 08/18/2020  5:27 PM   Date:  08/18/2020   ID:  Brandi Dunn, DOB 04/15/1980, MRN [id number]  Patient Location: Home Provider Location: Home Office  PCP:  Kathyrn Drown, MD   Chief Complaint:  UTI   History of Present Illness:    Brandi Dunn is a 40 y.o. female with history as stated below. Presents video telehealth for an acute care visit for UTI symptoms. She reports feeling increased urgency, and frequency and limited urine when she does go. She denies burning but reports tingling. Denies N/V, flank or  pelvic and back pain. Denies fevers and chills. Similar sensations in the past when she has had a UTI. Denies blood in urine and denies vaginal discharge or changes.   The patient does not have symptoms concerning for COVID-19 infection (fever, chills, cough, or new shortness of breath).   Past Medical, Surgical, Social History, Allergies, and Medications have been Reviewed.  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     Allergies:   Patient has no known allergies.   ROS See HPI for history of present illness.  Physical Exam Limited exam due to connection being by phone            A&P  1. Dysuria -s&s consistent with UTI -treatment provided -advised of good hygiene practice  -encouraged to reach out to PCP if not better post treatment.  - nitrofurantoin, macrocrystal-monohydrate, (MACROBID) 100 MG capsule; Take 1 capsule (100 mg total) by mouth 2 (two) times daily for 5 days.  Dispense: 10 capsule; Refill: 0  Time:   Today, I have spent 10 minutes with the patient with telehealth technology discussing the above problems, reviewing the chart, previous notes, medications and orders.   Medication Changes: No orders of the defined types were placed in this encounter.    Disposition:  Follow up as needed  Signed, Perlie Mayo, NP  08/18/2020 5:27 PM

## 2020-08-18 NOTE — Telephone Encounter (Signed)
If maybe she could be worked in if not pt would like to know. Told her to call back in the morning to see if there were any cancellations

## 2020-08-18 NOTE — Telephone Encounter (Signed)
Patient advised to go to urgent care for evaluation and treatment. Patient verbalized understanding.

## 2020-08-19 DIAGNOSIS — Z9889 Other specified postprocedural states: Secondary | ICD-10-CM | POA: Insufficient documentation

## 2020-09-14 ENCOUNTER — Ambulatory Visit: Payer: Medicaid Other | Admitting: Psychology

## 2020-09-21 DIAGNOSIS — J343 Hypertrophy of nasal turbinates: Secondary | ICD-10-CM | POA: Diagnosis not present

## 2020-09-21 DIAGNOSIS — J353 Hypertrophy of tonsils with hypertrophy of adenoids: Secondary | ICD-10-CM | POA: Diagnosis not present

## 2020-09-21 DIAGNOSIS — K219 Gastro-esophageal reflux disease without esophagitis: Secondary | ICD-10-CM | POA: Diagnosis not present

## 2020-09-21 DIAGNOSIS — J342 Deviated nasal septum: Secondary | ICD-10-CM | POA: Diagnosis not present

## 2020-09-21 DIAGNOSIS — R053 Chronic cough: Secondary | ICD-10-CM | POA: Diagnosis not present

## 2020-09-21 DIAGNOSIS — J3489 Other specified disorders of nose and nasal sinuses: Secondary | ICD-10-CM | POA: Diagnosis not present

## 2020-09-21 DIAGNOSIS — J3801 Paralysis of vocal cords and larynx, unilateral: Secondary | ICD-10-CM | POA: Diagnosis not present

## 2020-09-28 ENCOUNTER — Ambulatory Visit (INDEPENDENT_AMBULATORY_CARE_PROVIDER_SITE_OTHER): Payer: Medicaid Other | Admitting: Psychology

## 2020-09-28 DIAGNOSIS — F418 Other specified anxiety disorders: Secondary | ICD-10-CM | POA: Diagnosis not present

## 2020-10-06 ENCOUNTER — Telehealth: Payer: Self-pay | Admitting: Advanced Practice Midwife

## 2020-10-06 NOTE — Telephone Encounter (Signed)
Patient called wanting to see if she could see Manus Gunning sooner than the 10/28/20, because she's having some issues with the iud.

## 2020-10-06 NOTE — Telephone Encounter (Signed)
/  Called pt to inquire about why she wanted to see Manus Gunning earlier. Pt stated that she was having heavier bleeding and more pain with her periods. Pt was told there were no earlier appts with Manus Gunning and offered an appt with another provider, but pt declined. Pt will keep appt with Manus Gunning on 9/22.

## 2020-10-28 ENCOUNTER — Encounter: Payer: Self-pay | Admitting: Advanced Practice Midwife

## 2020-10-28 ENCOUNTER — Other Ambulatory Visit: Payer: Self-pay

## 2020-10-28 ENCOUNTER — Ambulatory Visit: Payer: Medicaid Other | Admitting: Advanced Practice Midwife

## 2020-10-28 VITALS — BP 128/81 | HR 93 | Ht 65.0 in | Wt 256.0 lb

## 2020-10-28 DIAGNOSIS — Z3202 Encounter for pregnancy test, result negative: Secondary | ICD-10-CM | POA: Diagnosis not present

## 2020-10-28 DIAGNOSIS — Z30433 Encounter for removal and reinsertion of intrauterine contraceptive device: Secondary | ICD-10-CM

## 2020-10-28 DIAGNOSIS — Z30430 Encounter for insertion of intrauterine contraceptive device: Secondary | ICD-10-CM

## 2020-10-28 LAB — POCT URINE PREGNANCY: Preg Test, Ur: NEGATIVE

## 2020-10-28 MED ORDER — LEVONORGESTREL 20 MCG/DAY IU IUD
1.0000 | INTRAUTERINE_SYSTEM | Freq: Once | INTRAUTERINE | Status: AC
  Administered 2020-10-28: 1 via INTRAUTERINE

## 2020-10-28 NOTE — Addendum Note (Signed)
Addended by: Linton Rump on: 10/28/2020 04:39 PM   Modules accepted: Orders

## 2020-10-28 NOTE — Progress Notes (Signed)
ODETTE WATANABE is a 40 y.o. year old African American female   who presents for removal and replacement of her Mirena IUD. The new IUD is Mirena It has been 5 years since her previous IUD placement. She has started having heavy periods again, so wants to change it out   The risks and benefits of the method and placement have been thouroughly reviewed with the patient and all questions were answered.  Specifically the patient is aware of failure rate of 02/998, expulsion of the IUD and of possible perforation.  The patient is aware of irregular bleeding due to the method and understands the incidence of irregular bleeding diminishes with time.  Time out was performed.  A Graves speculum was placed.  The cervix was prepped using Betadine. The strings were found to be  visible.   They were grasped and the IUD was easily removed. The cervix was then grasped with a tenaculum and the uterus was sounded to 8 cm. The IUD was inserted to 8 cm.  It was pulled back 1 cm and the IUD was disengaged. After 15 seconds, the IUD was placed in the fundus.  The strings were trimmed to 3 cm.  Sonogram was performed and the proper placement of the IUD was verified.  The patient was instructed on signs and symptoms of infection and to check for the strings after each menses or each month.  The patient is to refrain from intercourse for 3 days.

## 2020-10-30 ENCOUNTER — Encounter: Payer: Self-pay | Admitting: Nurse Practitioner

## 2020-10-30 ENCOUNTER — Telehealth: Payer: Medicaid Other | Admitting: Nurse Practitioner

## 2020-10-30 DIAGNOSIS — H00014 Hordeolum externum left upper eyelid: Secondary | ICD-10-CM

## 2020-10-30 MED ORDER — BACITRACIN-POLYMYXIN B 500-10000 UNIT/GM OP OINT: 1.0000 "application " | TOPICAL_OINTMENT | Freq: Four times a day (QID) | OPHTHALMIC | 0 refills | Status: DC

## 2020-10-30 MED ORDER — BACITRACIN-POLYMYXIN B 500-10000 UNIT/GM OP OINT: 1.0000 "application " | TOPICAL_OINTMENT | Freq: Four times a day (QID) | OPHTHALMIC | 0 refills | Status: AC

## 2020-10-30 NOTE — Progress Notes (Signed)
Virtual Visit Consent   ZANAE KUEHNLE, you are scheduled for a virtual visit with a [facility] provider today.     Just as with appointments in the office, your consent must be obtained to participate.  Your consent will be active for this visit and any virtual visit you may have with one of our providers in the next 365 days.     If you have a MyChart account, a copy of this consent can be sent to you electronically.  All virtual visits are billed to your insurance company just like a traditional visit in the office.    As this is a virtual visit, video technology does not allow for your provider to perform a traditional examination.  This may limit your provider's ability to fully assess your condition.  If your provider identifies any concerns that need to be evaluated in person or the need to arrange testing (such as labs, EKG, etc.), we will make arrangements to do so.     Although advances in technology are sophisticated, we cannot ensure that it will always work on either your end or our end.  If the connection with a video visit is poor, the visit may have to be switched to a telephone visit.  With either a video or telephone visit, we are not always able to ensure that we have a secure connection.     I need to obtain your verbal consent now.   Are you willing to proceed with your visit today?    AUDRYNA WENDT has provided verbal consent on 10/30/2020 for a virtual visit (video or telephone).   [name], FNP   Date: 10/30/2020 8:44 AM   Virtual Visit via Video Note   I, [name], connected with  JULYA ALIOTO  ([id number], [birth year]) on 10/30/20 at  8:45 AM EDT by a video-enabled telemedicine application and verified that I am speaking with the correct person using two identifiers.  Location: Patient: Virtual Visit Location Patient: Home Provider: Virtual Visit Location Provider: Office/Clinic   I discussed the limitations of evaluation and management by  telemedicine and the availability of in person appointments. The patient expressed understanding and agreed to proceed.    History of Present Illness: Brandi Dunn is a 40 y.o. who identifies as a female who was assigned female at birth, and is being seen today with complaints of left eye pain and swelling. This started over the past few days, last night it was itching more and she was using warm compresses.   This morning her left upper eyelid was swollen and painful.   She did initially notice a small bump on the upper lid that is now not visible.    Problems:  Patient Active Problem List   Diagnosis Date Noted   GAD (generalized anxiety disorder) 06/17/2020   Yeast infection 12/01/2019   Hypercalcemia 11/19/2019   Hyperparathyroidism, primary ([place]) 11/05/2019   Female pelvic pain 04/06/2016   Vitamin D deficiency 08/23/2015   GERD (gastroesophageal reflux disease) 08/18/2015   Migraine 08/18/2015   Encounter for IUD insertion 03/10/2015    Allergies: No Known Allergies Medications:  Current Outpatient Medications:    escitalopram (LEXAPRO) 5 MG tablet, Take 1 tablet (5 mg total) by mouth daily. (Patient not taking: Reported on 10/28/2020), Disp: 30 tablet, Rfl: 1   fluticasone (FLONASE) 50 MCG/ACT nasal spray, Place 2 sprays into both nostrils daily., Disp: , Rfl:    levonorgestrel (MIRENA) 20 MCG/24HR IUD, 1 each by  Intrauterine route once., Disp: , Rfl:    omeprazole (PRILOSEC) 40 MG capsule, Take 40 mg by mouth daily., Disp: , Rfl:    Vitamin D, Ergocalciferol, (DRISDOL) 1.25 MG (50000 UNIT) CAPS capsule, TAKE 1 CAPSULE BY MOUTH EVERY 7 DAYS, Disp: 4 capsule, Rfl: 0  Observations/Objective: Patient is well-developed, well-nourished in no acute distress.  Resting comfortably at home.  Head is normocephalic, atraumatic.  No labored breathing.  Speech is clear and coherent with logical content.  Patient is alert and oriented at baseline.  Left upper eyelid is swollen able  to visualize sclera and conjunctiva clear.  Swelling is contained to eyelid    Assessment and Plan: 1. Hordeolum externum left upper eyelid Continue warm compresses prior to application of antibiotic ointment.   - bacitracin-polymyxin b (POLYSPORIN) ophthalmic ointment; Place 1 application into the left eye 4 (four) times daily for 5 days. apply to eye every 12 hours while awake  Dispense: 3.5 g; Refill: 0    Advised OTC motrin for pain and swelling and OTC Claritin for itching as directed by package.   Follow Up Instructions: I discussed the assessment and treatment plan with the patient. The patient was provided an opportunity to ask questions and all were answered. The patient agreed with the plan and demonstrated an understanding of the instructions.  A copy of instructions were sent to the patient via MyChart unless otherwise noted below.     The patient was advised to call back or seek an in-person evaluation if the symptoms worsen or if the condition fails to improve as anticipated.  Time:  I spent 10 minutes with the patient via telehealth technology discussing the above problems/concerns.    [name], FNP

## 2020-10-30 NOTE — Addendum Note (Signed)
Addended by: Madilyn Hook on: 10/30/2020 09:36 AM   Modules accepted: Orders

## 2020-11-01 ENCOUNTER — Other Ambulatory Visit (HOSPITAL_COMMUNITY): Payer: Self-pay | Admitting: Diagnostic Radiology

## 2020-11-01 ENCOUNTER — Telehealth: Payer: Self-pay | Admitting: Family Medicine

## 2020-11-01 NOTE — Telephone Encounter (Signed)
Patient stated she wanted to get a mammogram but needed to get a diagnostic mammogram order.  She also stated that she found 2 new knots under her arms this morning 9/26. Very concerned. Schedule appointment with Dr. Nicki Reaper on 9/29 at 10:20  CB#  [phone]

## 2020-11-01 NOTE — Telephone Encounter (Signed)
Please advise. Thank you

## 2020-11-01 NOTE — Telephone Encounter (Signed)
Mammogram is will take several days for them to happen I believe it would be best for the patient to be seen as planned then we can schedule up diagnostic mammogram plus also once I have listen to her, examine her it would help [place] with the order-we can be more specific thank you

## 2020-11-03 ENCOUNTER — Ambulatory Visit (INDEPENDENT_AMBULATORY_CARE_PROVIDER_SITE_OTHER): Payer: Medicaid Other | Admitting: Psychology

## 2020-11-03 DIAGNOSIS — F418 Other specified anxiety disorders: Secondary | ICD-10-CM | POA: Diagnosis not present

## 2020-11-04 ENCOUNTER — Ambulatory Visit: Payer: Medicaid Other | Admitting: Family Medicine

## 2020-11-04 ENCOUNTER — Other Ambulatory Visit: Payer: Self-pay

## 2020-11-04 VITALS — BP 118/83 | HR 92 | Ht 64.0 in | Wt 256.6 lb

## 2020-11-04 DIAGNOSIS — M7989 Other specified soft tissue disorders: Secondary | ICD-10-CM

## 2020-11-04 MED ORDER — DOXYCYCLINE HYCLATE 100 MG PO TABS: 100.0000 mg | ORAL_TABLET | Freq: Two times a day (BID) | ORAL | 0 refills | Status: DC

## 2020-11-04 NOTE — Progress Notes (Signed)
   Subjective:    Patient ID: Brandi Dunn, female    DOB: 17-Jun-1980, 40 y.o.   MRN: [id number]  HPI  Patient arrives with knots under left arm this am. Patient also has a stye on left eye she would like checked  Review of Systems     Objective:   Physical Exam  Lungs are clear heart regular breast exam normal on the left side no masses felt under the arm subcutaneous sebaceous cyst noted minimal tenderness She will follow-up after her mammogram if it 100% clears up she will notify us sooner and potentially cancel follow-up appointment Nurse present during exam    Assessment & Plan:  No lymph nodes felt No breast mass Mammogram ordered Follow-up within 6 weeks Subcutaneous nodules consistent with sebaceous Recommend doxycycline twice daily for the next 7 days warm compresses If it does not dramatically improve over the next few weeks to notify us Call us sooner if any problems  Antibiotic should also help with her stye

## 2020-11-17 ENCOUNTER — Ambulatory Visit (INDEPENDENT_AMBULATORY_CARE_PROVIDER_SITE_OTHER): Payer: Medicaid Other | Admitting: Psychology

## 2020-11-17 DIAGNOSIS — F418 Other specified anxiety disorders: Secondary | ICD-10-CM | POA: Diagnosis not present

## 2020-11-19 ENCOUNTER — Ambulatory Visit (HOSPITAL_COMMUNITY)
Admission: RE | Admit: 2020-11-19 | Discharge: 2020-11-19 | Disposition: A | Discharge status: Home / Self Care | Payer: Medicaid Other | Source: Ambulatory Visit | Attending: Family Medicine | Admitting: Family Medicine

## 2020-11-19 ENCOUNTER — Ambulatory Visit (HOSPITAL_COMMUNITY): Admission: RE | Admit: 2020-11-19 | Payer: Medicaid Other | Source: Ambulatory Visit

## 2020-11-19 ENCOUNTER — Other Ambulatory Visit: Payer: Self-pay

## 2020-11-19 DIAGNOSIS — M7989 Other specified soft tissue disorders: Secondary | ICD-10-CM | POA: Insufficient documentation

## 2020-11-19 IMAGING — US US BREAST*L* LIMITED INC AXILLA
1 series · 6 of 6 positions shown · non-contrast
Comparison: Previous exam(s).

CLINICAL DATA: 40-year-old female with palpable thickening in the
UPPER OUTER LEFT breast/LEFT axilla discovered on self-examination.
Also for annual bilateral mammogram.

EXAM:
DIGITAL DIAGNOSTIC BILATERAL MAMMOGRAM WITH TOMOSYNTHESIS AND CAD;
ULTRASOUND LEFT BREAST LIMITED
TECHNIQUE: Bilateral digital diagnostic mammography and breast tomosynthesis
was performed. The images were evaluated with computer-aided
detection.; Targeted ultrasound examination of the left breast was
performed.

[Series 1: us breast*left* limited inc axilla · 0.07mm/px · 6 of 6 slices shown]
[im 1/6]
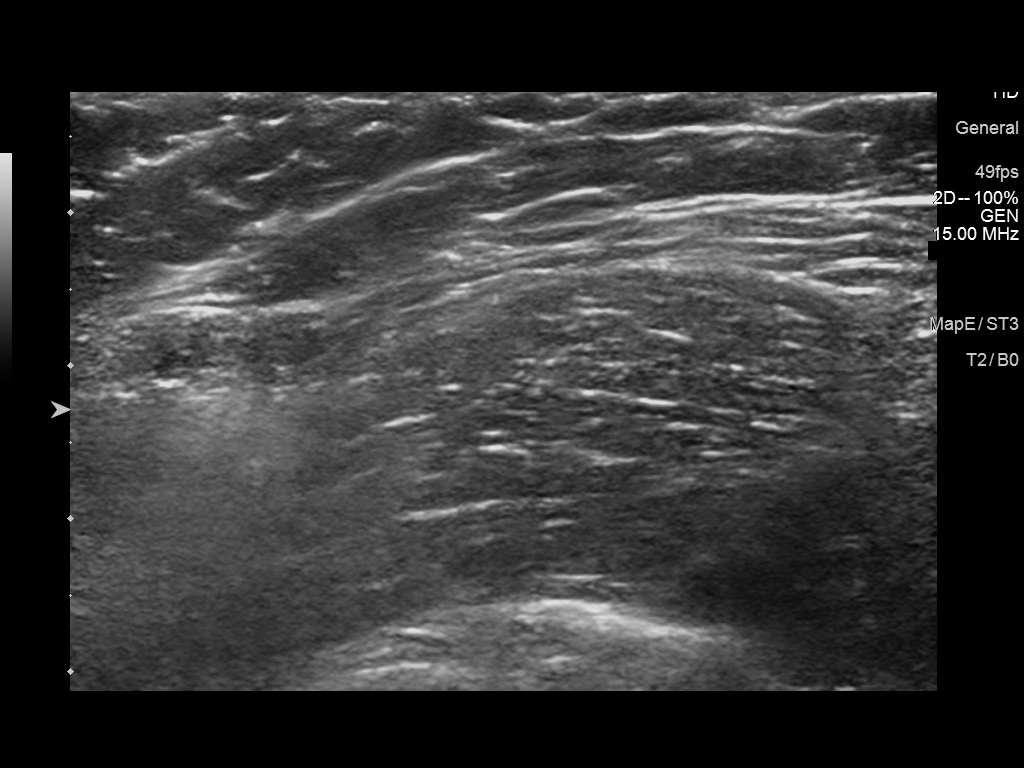
[im 2/6]
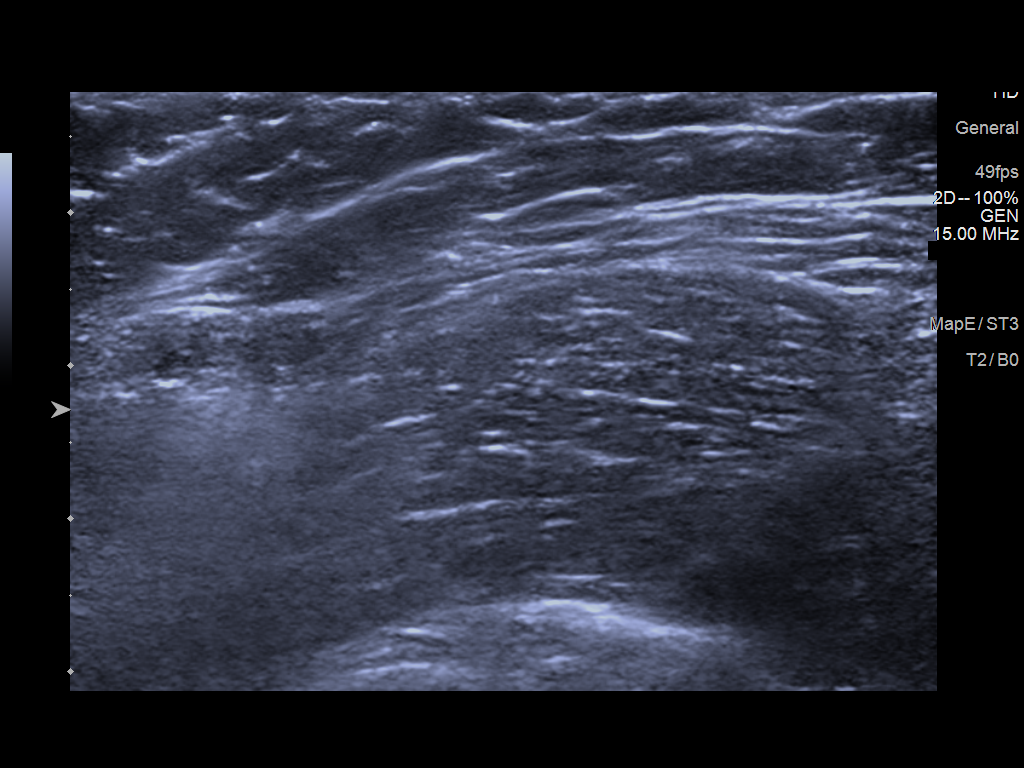
[im 3/6]
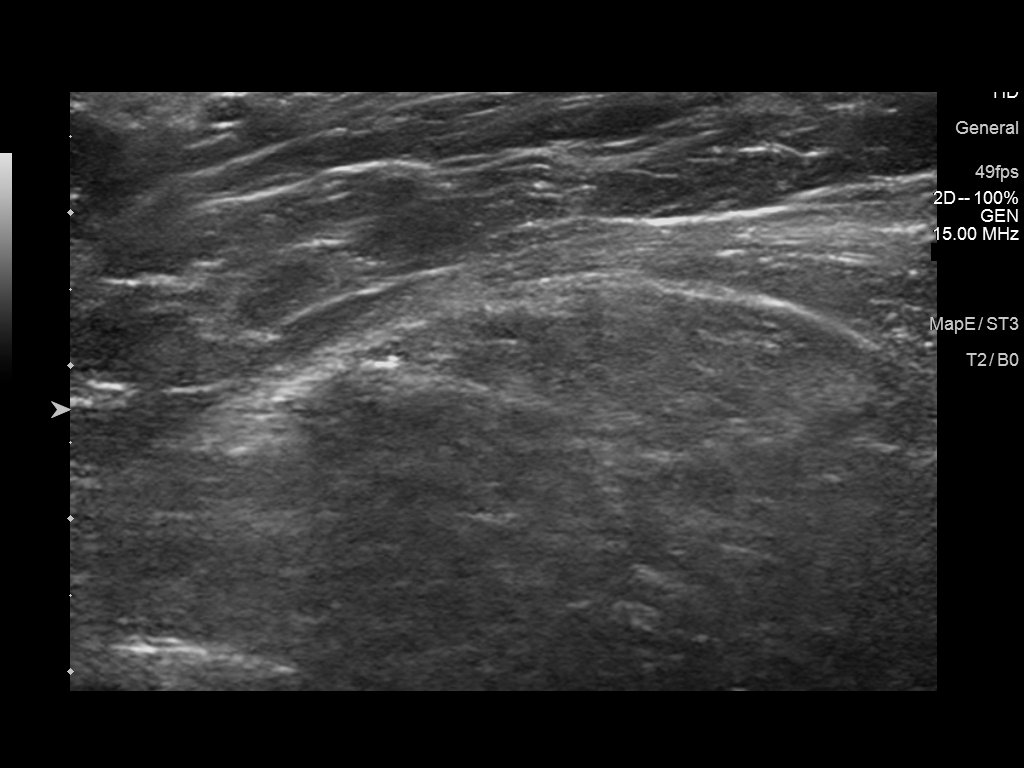
[im 4/6]
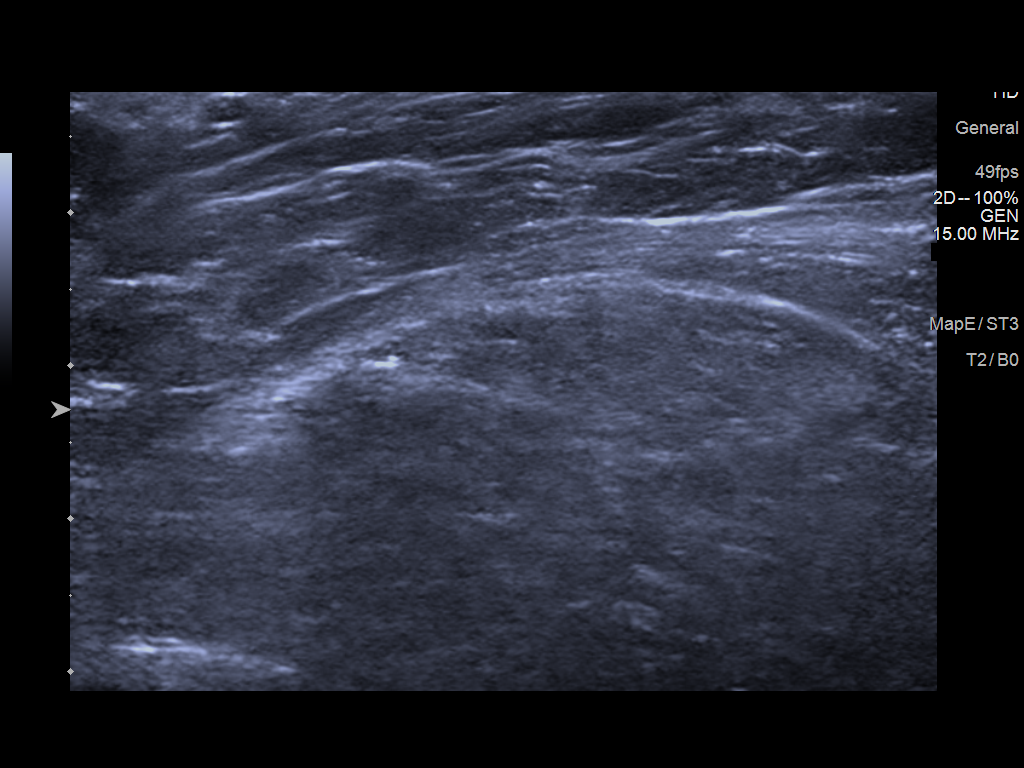
[im 5/6]
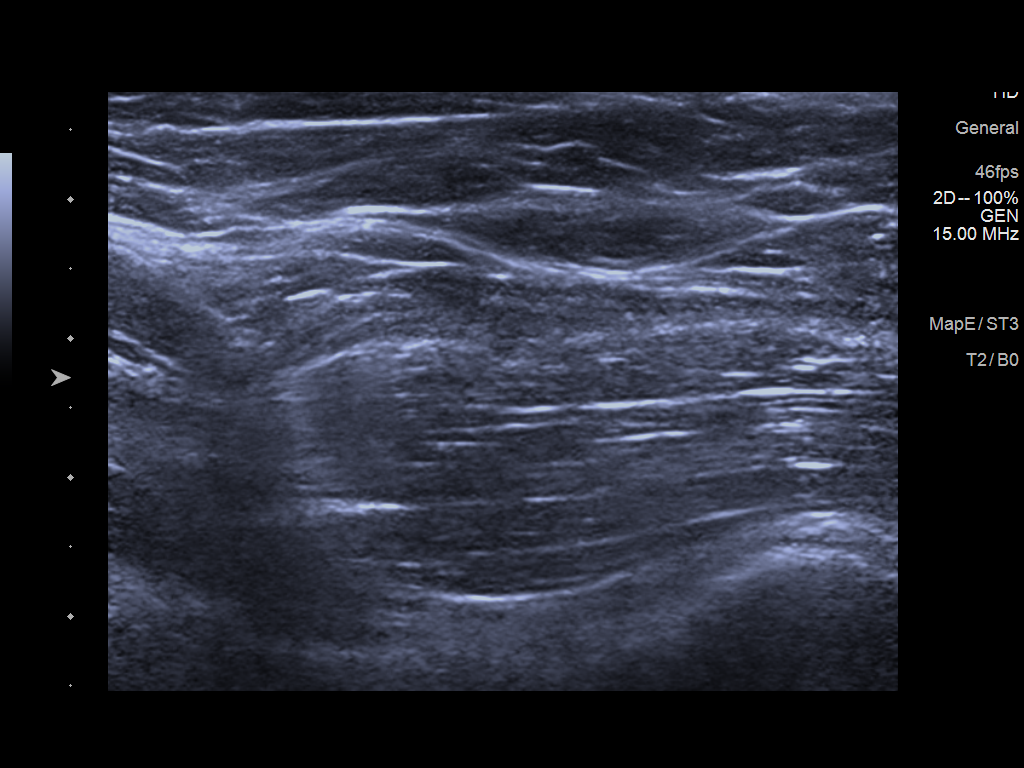
[im 6/6]
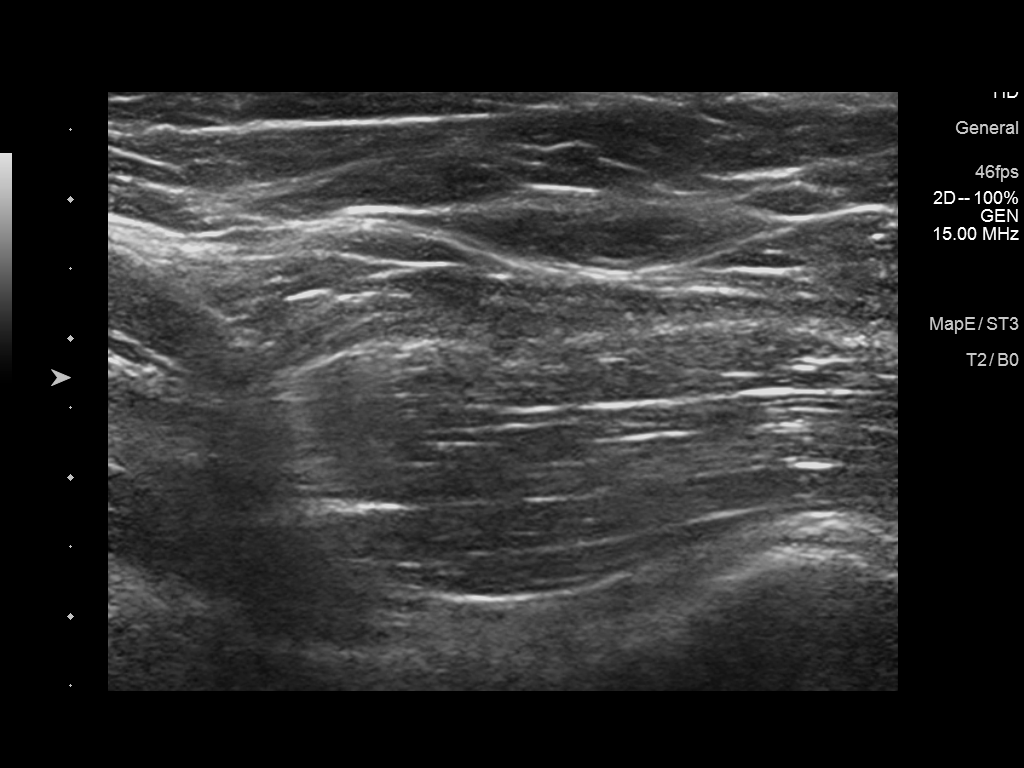

[6 of 6 positions shown; findings below may reference images not displayed]

ACR Breast Density Category c: The breast tissue is heterogeneously
dense, which may obscure small masses.
FINDINGS: 2D/3D full field views of both breasts demonstrate no suspicious
mass, distortion or worrisome calcifications.

On physical exam, no palpable abnormalities are identified within
the UPPER-OUTER LEFT breast or LEFT axilla.

Targeted ultrasound is performed, showing no sonographic
abnormalities within the UPPER-OUTER LEFT breast or LEFT axilla.
Normal appearing LEFT axillary lymph nodes are noted.
IMPRESSION: 1. No mammographic, suspicious palpable or sonographic abnormalities
within the UPPER-OUTER LEFT breast or LEFT axilla.
2. No mammographic evidence of breast malignancy.

RECOMMENDATION:
Bilateral screening mammogram in 1 year.

I have discussed the findings and recommendations with the patient.
If applicable, a reminder letter will be sent to the patient
regarding the next appointment.

BI-RADS CATEGORY  1: Negative.

## 2020-11-23 ENCOUNTER — Ambulatory Visit (HOSPITAL_COMMUNITY): Payer: Medicaid Other

## 2020-11-23 ENCOUNTER — Encounter (HOSPITAL_COMMUNITY): Payer: Medicaid Other

## 2020-11-30 ENCOUNTER — Other Ambulatory Visit (HOSPITAL_COMMUNITY): Payer: Medicaid Other

## 2020-11-30 ENCOUNTER — Encounter (HOSPITAL_COMMUNITY): Payer: Medicaid Other

## 2020-12-13 ENCOUNTER — Ambulatory Visit (INDEPENDENT_AMBULATORY_CARE_PROVIDER_SITE_OTHER): Payer: Medicaid Other | Admitting: Psychology

## 2020-12-13 DIAGNOSIS — F418 Other specified anxiety disorders: Secondary | ICD-10-CM | POA: Diagnosis not present

## 2020-12-16 ENCOUNTER — Ambulatory Visit: Payer: Medicaid Other | Admitting: Family Medicine

## 2020-12-20 ENCOUNTER — Other Ambulatory Visit: Payer: Self-pay | Admitting: *Deleted

## 2020-12-20 DIAGNOSIS — K862 Cyst of pancreas: Secondary | ICD-10-CM

## 2021-01-03 ENCOUNTER — Other Ambulatory Visit: Payer: Self-pay

## 2021-01-03 ENCOUNTER — Encounter: Payer: Self-pay | Admitting: Family Medicine

## 2021-01-03 ENCOUNTER — Ambulatory Visit: Payer: Medicaid Other | Admitting: Family Medicine

## 2021-01-03 NOTE — Progress Notes (Signed)
   Subjective:    Patient ID: Brandi Dunn, female    DOB: 1981/02/03, 40 y.o.   MRN: [id number]  HPI Pt here for follow up. Mammogram was normal. Pt states she has felt some on right side last week. Pt not feeling knots on left side as much.  Her breast tissue she states is doing much better She states that she is doing the best she can Had a mammogram which was essentially negative but does have dense breast Sees gynecology on a yearly basis for female exam  Pt would like to ask provider about weight lost.  We had a long discussion regarding weight loss she is tried calorie restriction, diet changes, exercising had a difficult time losing weight  Review of Systems     Objective:   Physical Exam Lungs clear heart regular HEENT benign       Assessment & Plan:  Recent mammogram negative recommend yearly exam with her gynecologist as well as mammogram on a yearly basis if she has other areas that become apparent she needs to bring this to our attention  Morbid obesity she is trying various ways to lose weight have very difficult time doing so, I recommend a calorie restriction regular physical activity consideration of medication She states she will be going on [employer] insurance in the near future Pap perhaps she will can be on medication such as Wegovy or [place] she will give [place] updates on her insurance status

## 2021-01-03 NOTE — Patient Instructions (Signed)
Please let us know when you are on [place] insurance We can help set up [facility] if you are interested Thanks

## 2021-01-10 ENCOUNTER — Ambulatory Visit (INDEPENDENT_AMBULATORY_CARE_PROVIDER_SITE_OTHER): Payer: No Typology Code available for payment source | Admitting: Psychology

## 2021-01-10 DIAGNOSIS — F411 Generalized anxiety disorder: Secondary | ICD-10-CM | POA: Diagnosis not present

## 2021-01-10 NOTE — Progress Notes (Signed)
[place] Counselor/Therapist Progress Note  Patient ID: Brandi Dunn, MRN: [id number].    Date: 01/10/2021  Time Spent: 4:04 pm - 5:00 pm:  56 minutes  Treatment Type: Individual Therapy  Reported Symptoms: grief, sadness, anxiety  Mental Status Exam: Appearance:  Well Groomed     Behavior: Appropriate  Motor: Normal  Speech/Language:  Clear and Coherent and Normal Rate  Affect: Depressed and Tearful  Mood: sad  Thought process: normal  Thought content:   WNL  Sensory/Perceptual disturbances:   WNL  Orientation: oriented to person, place, time/date, and situation  Attention: Fair  Concentration: Fair  Memory: WNL  Fund of knowledge:  Good  Insight:   Good  Judgment:  Good  Impulse Control: Good    Subjective:   Brandi Dunn participated home, via video, and consented to treatment.We met online due to the [place] pandemic. Therapist participated from home office. Brandi Dunn reviewed the events of the past week. Brandi Dunn noted the recent and sudden loss of her close cousin who passed in his early 67's. She noted her and her family's attempts to administer CPR and her cousin's passing shortly after. She was tearful during the session and noted this loss spurring on additional anxiety in numerous areas day-to-day. We explored this during the session.  Therapist provided psychoeducation regarding grief and loss and the effects on mood.  Additionally,  Psycho-education regarding anxiety and GAD was provided regarding the effect on functioning and mood. Therapist validated client's feelings and normalized feelings of sadness and tearfulness.Brandi Dunn noted taking her medication around 12/13/20, Lexapro 45m qd, and noted not feeling the effects of the medication. We processed her concerns and therapist provided psycho-education regarding psychotropic medication. Therapist encouraged Brandi Dunn contact her medical provider to address mood and medication concerns.   LRavynnwas engaged and motivated during the session and expressed commitment towards her goals.  She highlighted her goal of decreasing her overall anxiety,  specifically managing her anxiety day-to-day via positive and helpful coping skills.  Therapist praised Brandi scientistfor her energy and effort in session, scheduled a follow-up to continue to work on symptom management, and provided supportive therapy.  Interventions: Grief Therapy and Psycho-education/Bibliotherapy  Diagnosis: GAD (generalized anxiety disorder) (By Record)  Plan: Patient is to use CBT & mindfulness, mindfulness and coping skills to help manage decrease symptoms associated with their diagnosis.   Long-term goal:   Reduce overall level, frequency, and intensity of the feelings of depression and anxiety evidenced by decreased worry, employing coping skills more consistently, reducing automatic negative thoughts and negative self talk, and helpless feelings from 6 to 7 days/week to 0 to 1 days/week per client report for at least 3 consecutive months.  Short-term goal:  Verbally express understanding of the relationship between feelings of depression, anxiety and their impact on thinking patterns and behaviors. Verbalize an understanding of the role that distorted thinking plays in creating fears, excessive worry, and ruminations.  Brandi Irish LCSW

## 2021-01-11 NOTE — Addendum Note (Signed)
Addended by: [name] on: 01/11/2021 12:10 PM   Modules accepted: Level of Service

## 2021-01-16 ENCOUNTER — Encounter: Payer: Self-pay | Admitting: Family Medicine

## 2021-01-16 DIAGNOSIS — F411 Generalized anxiety disorder: Secondary | ICD-10-CM

## 2021-01-17 NOTE — Telephone Encounter (Signed)
Nurses I am fine with increasing the dose to 10 mg daily But a couple things first  Please communicate with Aris-we need to make sure that she is not having any type of crisis situation.  In other words make sure she is not suicidal.  Otherwise may increase the dose from 5 mg to 10 mg, #30 with 2 refills with recommended follow-up visit within 3 to 6 weeks to see how things are going with the increased dose.  Thanks-Dr. Nicki Reaper

## 2021-01-18 MED ORDER — ESCITALOPRAM OXALATE 10 MG PO TABS: ORAL_TABLET | ORAL | 2 refills | Status: DC

## 2021-01-28 ENCOUNTER — Other Ambulatory Visit (HOSPITAL_COMMUNITY): Payer: Self-pay

## 2021-01-28 MED ORDER — OMEPRAZOLE 40 MG PO CPDR
40.0000 mg | DELAYED_RELEASE_CAPSULE | Freq: Every day | ORAL | 3 refills | Status: DC
  Filled 2021-07-08 – 2021-07-18 (×2): qty 30, 30d supply, fill #0
  Filled 2021-07-20: qty 90, 90d supply, fill #0

## 2021-01-28 MED ORDER — ESCITALOPRAM OXALATE 10 MG PO TABS
10.0000 mg | ORAL_TABLET | Freq: Every day | ORAL | 1 refills | Status: DC
  Filled 2021-02-10: qty 30, 30d supply, fill #0
  Filled 2021-03-16: qty 30, 30d supply, fill #1

## 2021-02-02 ENCOUNTER — Other Ambulatory Visit (HOSPITAL_COMMUNITY): Payer: Self-pay

## 2021-02-02 ENCOUNTER — Other Ambulatory Visit: Payer: Self-pay

## 2021-02-02 MED ORDER — OMEPRAZOLE 40 MG PO CPDR
40.0000 mg | DELAYED_RELEASE_CAPSULE | Freq: Every day | ORAL | 1 refills | Status: DC
  Filled 2021-02-02: qty 90, 90d supply, fill #0
  Filled 2021-05-09: qty 90, 90d supply, fill #1

## 2021-02-02 NOTE — Telephone Encounter (Signed)
Nurses May go ahead with the omeprazole 40 mg, #90, 1 refill through [place] she wants to have this delivered to [facility]  Also find out if the pharmacy has the availability of getting Ozempic or if there are a Producer, television/film/video just like everybody else

## 2021-02-03 ENCOUNTER — Other Ambulatory Visit: Payer: Self-pay | Admitting: *Deleted

## 2021-02-03 DIAGNOSIS — K862 Cyst of pancreas: Secondary | ICD-10-CM

## 2021-02-03 NOTE — Telephone Encounter (Signed)
Nurses Currently Ozempic is intended for diabetic patients. Patient is not diabetic. She does have the diagnosis of morbid obesity  Mancel Parsons is the treatment of choice it is the same medication that is in [place] but is under the name Wegovy  Please see if [facility] pharmacy has that?

## 2021-02-04 ENCOUNTER — Other Ambulatory Visit (HOSPITAL_COMMUNITY): Payer: Self-pay

## 2021-02-04 MED ORDER — WEGOVY 0.25 MG/0.5ML ~~LOC~~ SOAJ
0.2500 mg | SUBCUTANEOUS | 0 refills | Status: DC
  Filled 2021-02-04 – 2021-02-10 (×2): qty 2, 28d supply, fill #0

## 2021-02-04 NOTE — Addendum Note (Signed)
Addended by: Dairl Ponder on: 02/04/2021 03:51 PM   Modules accepted: Orders

## 2021-02-04 NOTE — Telephone Encounter (Signed)
Recommend Mancel Parsons This has the same medication in it that Ozempic does But it is indicated for morbid obesity  0.25 mg subcutaneous once a week for the first 4 weeks, if tolerating this well then can move up on the dose  Every 4 weeks we go up on the dose as long as it is tolerated  Patient will need to give [place] feedback in several weeks how this is going  Also it is standard and required follow-up visit every 3 months to monitor how well patient is doing with the medicine as well as tolerating of the dose and to see how well they are losing weight

## 2021-02-07 ENCOUNTER — Encounter: Payer: Medicaid Other | Admitting: Psychology

## 2021-02-07 NOTE — Progress Notes (Signed)
This encounter was created in error - please disregard.

## 2021-02-10 ENCOUNTER — Other Ambulatory Visit (HOSPITAL_COMMUNITY): Payer: Self-pay

## 2021-02-11 ENCOUNTER — Other Ambulatory Visit (HOSPITAL_COMMUNITY): Payer: Self-pay

## 2021-02-18 ENCOUNTER — Other Ambulatory Visit: Payer: Self-pay | Admitting: *Deleted

## 2021-02-18 DIAGNOSIS — K862 Cyst of pancreas: Secondary | ICD-10-CM

## 2021-02-21 ENCOUNTER — Ambulatory Visit (INDEPENDENT_AMBULATORY_CARE_PROVIDER_SITE_OTHER): Payer: No Typology Code available for payment source | Admitting: Psychology

## 2021-02-21 DIAGNOSIS — F411 Generalized anxiety disorder: Secondary | ICD-10-CM | POA: Diagnosis not present

## 2021-02-21 NOTE — Progress Notes (Signed)
[place] Counselor/Therapist Progress Note  Patient ID: Brandi Dunn, MRN: [id number]    Date: 02/21/2021   Time Spent: 4:34 pm - 5:24 pm:  50 minutes  Treatment Type: Individual Therapy  Reported Symptoms:  Anxiety, depression.  Mental Status Exam: Appearance:  Well Groomed     Behavior: Appropriate  Motor: Normal  Speech/Language:  Clear and Coherent and Normal Rate  Affect: Depressed and Tearful  Mood: sad  Thought process: normal  Thought content:   WNL  Sensory/Perceptual disturbances:   WNL  Orientation: oriented to person, place, time/date, and situation  Attention: Fair  Concentration: Fair  Memory: WNL  Fund of knowledge:  Good  Insight:   Good  Judgment:  Good  Impulse Control: Good    Subjective:   Brandi Dunn participated home, via video, and consented to treatment.We met online due to the [place] pandemic. Therapist participated from home office. Brandi Dunn reviewed the events of the past week. Brandi Dunn continues to take her Lexapro 10 mg qd prescription and takes her medication consistently.  Shamra denied noticing any positive or negative change from her Lexapro. Keniesha noted her interpersonal stressors with her brother and a need for additional boundaries, going forward. We worked on exploring barriers to boundary setting. Brandi Dunn noted her worry and frustration regarding her mother's, father's, and brother's drinking, including both short-term and long-term effects of their drinking.  Brandi Dunn endorsed hypervigilance, in regards to her phone ringing, due to numerous calls regarding her family, their drinking, and precarious situations.  We worked on identifying ways to set boundaries for self and others.  We worked on barriers to this including her own anxiety and perceived control over other people's actions.  We processed this during the session and worked on identifying the cost of low boundaries with family.  Therapist encouraged  Brandi Dunn to identify to identify how these poor boundaries affect her directly and indirectly.  We will process this going forward during our follow-up.  Therapist praised Brandi Dunn for her effort and energy during the session.  Therapist encouraged self-care.  Therapist provided supportive therapy.  Interventions: Grief Therapy and Psycho-education/Bibliotherapy  Diagnosis: GAD (generalized anxiety disorder) (By Record)  Plan: Patient is to use CBT & mindfulness, mindfulness and coping skills to help manage decrease symptoms associated with their diagnosis. (Target Date: 09/28/21)   Long-term goal:   Reduce overall level, frequency, and intensity of the feelings of depression and anxiety evidenced by decreased worry, employing coping skills more consistently, reducing automatic negative thoughts and negative self talk, and helpless feelings from 6 to 7 days/week to 0 to 1 days/week per client report for at least 3 consecutive months.  Short-term goal:  Verbally express understanding of the relationship between feelings of depression, anxiety and their impact on thinking patterns and behaviors.  Verbalize an understanding of the role that distorted thinking plays in creating fears, excessive worry, and ruminations.  Brandi Dunn participated in the creation of the treatment plan)  Brandi Irish, LCSW

## 2021-03-01 ENCOUNTER — Other Ambulatory Visit (HOSPITAL_COMMUNITY): Payer: Self-pay

## 2021-03-01 MED ORDER — WEGOVY 0.5 MG/0.5ML ~~LOC~~ SOAJ
0.5000 mg | SUBCUTANEOUS | 0 refills | Status: DC
  Filled 2021-03-01: qty 2, 28d supply, fill #0

## 2021-03-01 NOTE — Telephone Encounter (Signed)
Nurses May go with Wegovy 0.5 mg once weekly 1 month with 1 additional month.  Would need follow-up visit before the end of these 2 months (Currently she is on 0.25 mg weekly according to my records)

## 2021-03-01 NOTE — Telephone Encounter (Signed)
[facility] sent to [place] and pt is aware

## 2021-03-01 NOTE — Addendum Note (Signed)
Addended by: Vicente Males on: 03/01/2021 04:54 PM   Modules accepted: Orders

## 2021-03-08 ENCOUNTER — Other Ambulatory Visit (HOSPITAL_COMMUNITY): Payer: Self-pay

## 2021-03-08 ENCOUNTER — Other Ambulatory Visit: Payer: Self-pay | Admitting: Family Medicine

## 2021-03-14 ENCOUNTER — Ambulatory Visit (INDEPENDENT_AMBULATORY_CARE_PROVIDER_SITE_OTHER): Payer: No Typology Code available for payment source | Admitting: Psychology

## 2021-03-14 DIAGNOSIS — F411 Generalized anxiety disorder: Secondary | ICD-10-CM

## 2021-03-14 NOTE — Progress Notes (Incomplete)
[place] Counselor/Therapist Progress Note  Patient ID: Brandi Dunn, MRN: [id number]    Date: 03/14/2021   Time Spent: 4:37 pm - 5:34 pm:  *** minutes  Treatment Type: Individual Therapy  Reported Symptoms:  Anxiety, depression.  Mental Status Exam: Appearance:  Well Groomed     Behavior: Appropriate  Motor: Normal  Speech/Language:  Clear and Coherent and Normal Rate  Affect: Depressed and Tearful  Mood: sad  Thought process: normal  Thought content:   WNL  Sensory/Perceptual disturbances:   WNL  Orientation: oriented to person, place, time/date, and situation  Attention: Fair  Concentration: Fair  Memory: WNL  Fund of knowledge:  Good  Insight:   Good  Judgment:  Good  Impulse Control: Good    Subjective:   [name] participated home, via video, and consented to treatment.We met online due to the [place] pandemic. Therapist participated from home office. Hansel Starling Rohrbach reviewed the events of the past week. Rosey noted writing a list of her stressors, in regards to familial stressors, particularly her brother who is an alcoholic. Makyah noted feelings of being "bullied" by her brother.  We explored this during the session and her dynamics with her family, as a whole, which she noted is unhealthy  What ifs.    What they need when they need it?  Emdr?   Andera continues to take her Lexapro 10 mg qd prescription and takes her medication consistently.  Latrina denied noticing any positive or negative change from her Lexapro. Marigold noted her interpersonal stressors with her brother and a need for additional boundaries, going forward. We worked on exploring barriers to boundary setting. Keani noted her worry and frustration regarding her mother's, father's, and brother's drinking, including both short-term and long-term effects of their drinking.  Anesia endorsed hypervigilance, in regards to her phone ringing, due to numerous calls  regarding her family, their drinking, and precarious situations.  We worked on identifying ways to set boundaries for self and others.  We worked on barriers to this including her own anxiety and perceived control over other people's actions.  We processed this during the session and worked on identifying the cost of low boundaries with family.  Therapist encouraged Princella to identify to identify how these poor boundaries affect her directly and indirectly.  We will process this going forward during our follow-up.  Therapist praised [name] for her effort and energy during the session.  Therapist encouraged self-care.  Therapist provided supportive therapy.  Interventions: Grief Therapy and Psycho-education/Bibliotherapy  Diagnosis: GAD (generalized anxiety disorder) (By Record)  Plan: Patient is to use CBT & mindfulness, mindfulness and coping skills to help manage decrease symptoms associated with their diagnosis. (Target Date: 09/28/21)   Long-term goal:   Reduce overall level, frequency, and intensity of the feelings of depression and anxiety evidenced by decreased worry, employing coping skills more consistently, reducing automatic negative thoughts and negative self talk, and helpless feelings from 6 to 7 days/week to 0 to 1 days/week per client report for at least 3 consecutive months.  Short-term goal:  Verbally express understanding of the relationship between feelings of depression, anxiety and their impact on thinking patterns and behaviors.  Verbalize an understanding of the role that distorted thinking plays in creating fears, excessive worry, and ruminations.  [name] participated in the creation of the treatment plan)  [name], LCSW

## 2021-03-15 ENCOUNTER — Encounter: Payer: Self-pay | Admitting: *Deleted

## 2021-03-15 NOTE — Progress Notes (Signed)
[place] Counselor/Therapist Progress Note  Patient ID: Brandi Dunn, MRN: [id number]    Date: 03/14/2021  Time Spent: 4:37 pm - 5:34 pm:  57 minutes  Treatment Type: Individual Therapy  Reported Symptoms:  Anxiety, depression.  Mental Status Exam: Appearance:  Well Groomed     Behavior: Appropriate  Motor: Normal  Speech/Language:  Clear and Coherent and Normal Rate  Affect: Depressed and Tearful  Mood: sad  Thought process: normal  Thought content:   WNL  Sensory/Perceptual disturbances:   WNL  Orientation: oriented to person, place, time/date, and situation  Attention: Fair  Concentration: Fair  Memory: WNL  Fund of knowledge:  Good  Insight:   Good  Judgment:  Good  Impulse Control: Good    Subjective:  [name] participated home, via video, and consented to treatment.We met online due to the [place] pandemic. Therapist participated from home office. [name] reviewed the events of the past week. [name] noted writing a list of her stressors, in regards to familial stressors, particularly her brother who is an alcoholic. [name] noted feelings of being "bullied" by her brother.  We explored this during the session and her dynamics with her family, as a whole, which she noted is unhealthy. [name] noted significant rumination and catastrophization regarding her family. We reviewed assertive communication and boundary setting. Therapist validated and normalized [name]'s feelings and experience during the session. Therapist provided cursory overview regarding EMDR as a viable treatment approach going forward. This will be reviewed, during our follow-up, in a more thorough manner. Therapist praised [name] for her effort and energy during the session.  Therapist encouraged self-care.  Therapist provided supportive therapy.  Interventions: Psycho-education/Bibliotherapy and Interpersonal  Diagnosis: GAD (generalized anxiety disorder) (By  Record)  Plan: Patient is to use CBT & mindfulness, mindfulness and coping skills to help manage decrease symptoms associated with their diagnosis. (Target Date: 09/28/21)   Long-term goal:   Reduce overall level, frequency, and intensity of the feelings of depression and anxiety evidenced by decreased worry, employing coping skills more consistently, reducing automatic negative thoughts and negative self talk, and helpless feelings from 6 to 7 days/week to 0 to 1 days/week per client report for at least 3 consecutive months.  Short-term goal:  Verbally express understanding of the relationship between feelings of depression, anxiety and their impact on thinking patterns and behaviors.  Verbalize an understanding of the role that distorted thinking plays in creating fears, excessive worry, and ruminations.  [name] participated in the creation of the treatment plan)  [name], LCSW

## 2021-03-15 NOTE — Telephone Encounter (Signed)
Message sent to referral coordinator

## 2021-03-16 ENCOUNTER — Other Ambulatory Visit (HOSPITAL_COMMUNITY): Payer: Self-pay

## 2021-04-04 ENCOUNTER — Other Ambulatory Visit: Payer: Self-pay

## 2021-04-04 ENCOUNTER — Encounter: Payer: Self-pay | Admitting: Family Medicine

## 2021-04-04 ENCOUNTER — Other Ambulatory Visit: Payer: Self-pay | Admitting: Family Medicine

## 2021-04-04 MED ORDER — WEGOVY 1 MG/0.5ML ~~LOC~~ SOAJ: 1.0000 mg | SUBCUTANEOUS | 1 refills | Status: DC

## 2021-04-04 NOTE — Telephone Encounter (Signed)
May send in [facility] 1 mg subcutaneous once weekly, 1 month with 1 refill Patient does need to do a follow-up office visit in order to check her progress before we can send in additional refills

## 2021-04-11 ENCOUNTER — Ambulatory Visit (INDEPENDENT_AMBULATORY_CARE_PROVIDER_SITE_OTHER): Payer: No Typology Code available for payment source | Admitting: Psychology

## 2021-04-11 ENCOUNTER — Encounter: Payer: Self-pay | Admitting: Family Medicine

## 2021-04-11 DIAGNOSIS — F411 Generalized anxiety disorder: Secondary | ICD-10-CM

## 2021-04-11 NOTE — Progress Notes (Signed)
[place] Counselor/Therapist Progress Note ? ?Patient ID: Brandi Dunn, MRN: [id number]   ? ?Date: 03/14/2021 ? ?Time Spent: 4:06 pm - 4:50pm: 44 minutes ? ?Treatment Type: Individual Therapy ? ?Reported Symptoms:  Anxiety, depression. ? ?Mental Status Exam: ?Appearance:  Well Groomed     ?Behavior: Appropriate  ?Motor: Normal  ?Speech/Language:  Clear and Coherent and Normal Rate  ?Affect: Flat  ?Mood: Anxious  ?Thought process: normal  ?Thought content:   WNL  ?Sensory/Perceptual disturbances:   WNL  ?Orientation: oriented to person, place, time/date, and situation  ?Attention: Fair  ?Concentration: Fair  ?Memory: WNL  ?Fund of knowledge:  Good  ?Insight:   Good  ?Judgment:  Good  ?Impulse Control: Good  ? ? ?Subjective:  ?Hansel Starling Mcdanel participated home, via phone, and consented to treatment. We met online due to the [place] pandemic. Therapist participated from home office. Hansel Starling Perry reviewed the events of the past week. Farran noted feeling anxious over something innocuous.  We explored this during the session and Haelie's building anxiety.  Therapist provided psychoeducation regarding anxiety and its cumulative effect.  Cesily noted her worries regarding her Lexapro 10 mg daily not being effective.  Therapist encouraged Allaya to contact her prescriber, Dr. Wolfgang Phoenix, to discuss a possible medication change or dosage increase depending on clinical opinion.  We worked on identifying areas of control and lack of control.  Zoria noted a lack of exercise and quality of sleep possibly contributing to her anxious mood.  We worked on identifying ways to manage this via improving sleep, reviewing sleep hygiene, increasing exercise.  Aicha committed to using his sleep sound machine to aid in limiting external disturbance of her sleep and contacting her gym this week to sign up and locate a trainer.  Therapist praised [name] for her effort and energy during the session.  Psychoeducation  regarding anxiety was provided.  Therapist encouraged Loucille to create a list of areas of which she has to control and areas of which she has no control to be processed during our follow-up session.  Follow-up was scheduled.  Therapist praised [name] for her effort and energy and validated her experience.  Therapist provided supportive therapy.  Endia would benefit from continued therapy. ? ? ?Interventions: Interpersonal, problem-solving, and BA ? ?Diagnosis: GAD (generalized anxiety disorder) (By Record) ? ?Plan: Patient is to use CBT & mindfulness, mindfulness and coping skills to help manage decrease symptoms associated with their diagnosis. (Target Date: 09/28/21) ?  ?Long-term goal:   ?Reduce overall level, frequency, and intensity of the feelings of depression and anxiety evidenced by decreased worry, employing coping skills more consistently, reducing automatic negative thoughts and negative self talk, and helpless feelings from 6 to 7 days/week to 0 to 1 days/week per client report for at least 3 consecutive months. ? ?Short-term goal:  ?Verbally express understanding of the relationship between feelings of depression, anxiety and their impact on thinking patterns and behaviors. ? ?Verbalize an understanding of the role that distorted thinking plays in creating fears, excessive worry, and ruminations. ? ?([place] participated in the creation of the treatment plan) ? ?[name], LCSW ?

## 2021-04-12 NOTE — Telephone Encounter (Signed)
Needs office visit in order to get a baseline to how things are going plus also make adjustment on medicine ?

## 2021-04-14 ENCOUNTER — Other Ambulatory Visit: Payer: Self-pay

## 2021-04-14 ENCOUNTER — Other Ambulatory Visit (HOSPITAL_COMMUNITY): Payer: Self-pay

## 2021-04-14 ENCOUNTER — Ambulatory Visit (INDEPENDENT_AMBULATORY_CARE_PROVIDER_SITE_OTHER): Payer: No Typology Code available for payment source | Admitting: Family Medicine

## 2021-04-14 VITALS — BP 101/62 | HR 65 | Temp 99.4°F | Ht 64.0 in | Wt 248.2 lb

## 2021-04-14 DIAGNOSIS — F411 Generalized anxiety disorder: Secondary | ICD-10-CM | POA: Diagnosis not present

## 2021-04-14 MED ORDER — SERTRALINE HCL 100 MG PO TABS
100.0000 mg | ORAL_TABLET | Freq: Every day | ORAL | 1 refills | Status: DC
  Filled 2021-04-14: qty 90, 90d supply, fill #0
  Filled 2021-07-08: qty 90, 90d supply, fill #1

## 2021-04-14 MED ORDER — WEGOVY 1.7 MG/0.75ML ~~LOC~~ SOAJ
1.7000 mg | SUBCUTANEOUS | 1 refills | Status: DC
  Filled 2021-04-14: qty 4, fill #0
  Filled 2021-04-29: qty 3, 28d supply, fill #0

## 2021-04-14 MED ORDER — DIAZEPAM 5 MG PO TABS
5.0000 mg | ORAL_TABLET | ORAL | 0 refills | Status: DC
  Filled 2021-04-14: qty 4, 1d supply, fill #0

## 2021-04-14 NOTE — Progress Notes (Signed)
? ?  Subjective:  ? ? Patient ID: Brandi Dunn, female    DOB: 1980-03-03, 41 y.o.   MRN: [id number] ? ?HPI ? ?Patient wants to discuss anxiety medications ?She relates that the medication just does not seem to be working enough for her ?She finds her self feeling anxious and on edge ?Denies being depressed ?States she is trying the best she can to do self help relieve ?Patient wants to discuss [facility] medication. ?She states she is tolerating medicine well but she does need to go up on the dose ?Denies any severe nausea or vomiting ?Review of Systems ? ?   ?Objective:  ? Physical Exam ?General-in no acute distress ?Eyes-no discharge ?Lungs-respiratory rate normal, CTA ?CV-no murmurs,RRR ?Extremities skin warm dry no edema ?Neuro grossly normal ?Behavior normal, alert ? ? ? ? ?   ?Assessment & Plan:  ?1. Morbid obesity ([place]) ?She is working hard on diet ?Trying to make better choices ?Trial of diet and exercise regular basis ?We will bump up the dose of Wegovy on her next refill to 1.7 ?She will send [place] update in about 3 weeks ?If doing well with this will send in 2.4 ?She will maintain 2.4 until her follow-up in 3 months ? ?2. GAD (generalized anxiety disorder) ?Current medication Lexapro not helping.  Switch over to sertraline 100 mg daily.  If any ongoing trouble to notify us.  Give [place] feedback within a few weeks how she is doing follow-up within 3 months ? ? ?

## 2021-04-21 ENCOUNTER — Other Ambulatory Visit: Payer: Self-pay

## 2021-04-21 ENCOUNTER — Ambulatory Visit (HOSPITAL_COMMUNITY)
Admission: RE | Admit: 2021-04-21 | Discharge: 2021-04-21 | Disposition: A | Discharge status: Home / Self Care | Payer: No Typology Code available for payment source | Source: Ambulatory Visit | Attending: Family Medicine | Admitting: Family Medicine

## 2021-04-21 ENCOUNTER — Other Ambulatory Visit: Payer: Self-pay | Admitting: Family Medicine

## 2021-04-21 DIAGNOSIS — K862 Cyst of pancreas: Secondary | ICD-10-CM | POA: Diagnosis not present

## 2021-04-21 IMAGING — MR MR 3D RECON AT [facility]
20 of 23 series · 43 of 48 positions shown · IV contrast (10 ml Gadavist)
Comparison: MRI abdomen [DATE]

CLINICAL DATA: Pancreatic cyst follow-up

EXAM:
MRI ABDOMEN WITHOUT AND WITH CONTRAST
TECHNIQUE: Multiplanar multisequence MR imaging of the abdomen was performed
both before and after the administration of intravenous contrast.
CONTRAST:  10mL GADAVIST GADOBUTROL 1 MMOL/ML IV SOLN

[Series 3: cor haste · coronal · 6.0mm · 1.25mm/px · 1 of 35 slices shown]
[im 1/35]
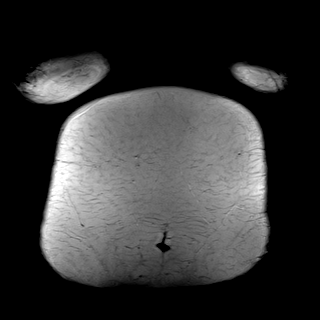

[Series 4: T2 fat-sat · axial · 6.0mm · 1.25mm/px · 1 of 35 slices shown]
[im 1/35]
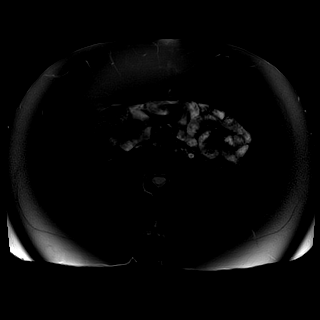

[Series 5: DWI · axial · 6.0mm · 1.49mm/px · 1 of 35 slices shown (1 of 4)]
[im 1/35]
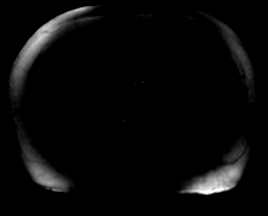

[Series 5: DWI · axial · 6.0mm · 1.49mm/px · 1 of 35 slices shown (2 of 4)]
[im 1/35]
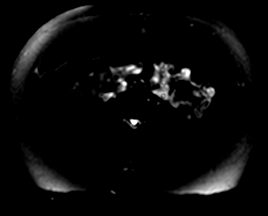

[Series 5: DWI · axial · 6.0mm · 1.49mm/px · 1 of 35 slices shown (3 of 4)]
[im 1/35]
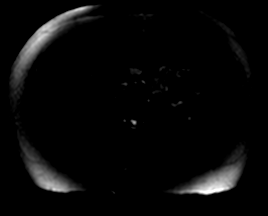

[Series 6: DWI · axial · 6.0mm · 1.49mm/px · 1 of 35 slices shown (4 of 4)]
[im 1/35]
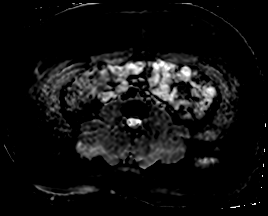

[Series 7: ax in and · axial · 3.0mm · 1.25mm/px · z∈[-106,+131]mm · 2 of 80 slices shown (1 of 2)]
[im 1/80]
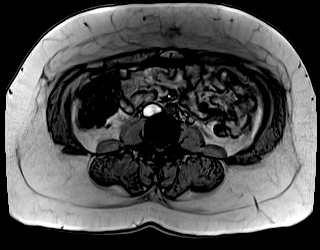
[im 80/80]
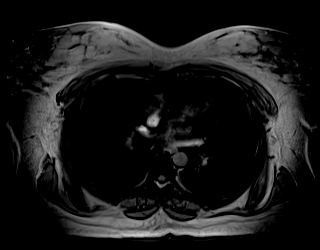

[Series 8: ax in and · axial · 3.0mm · 1.25mm/px · z∈[-106,+131]mm · 3 of 80 slices shown (2 of 2)]
[im 1/80]
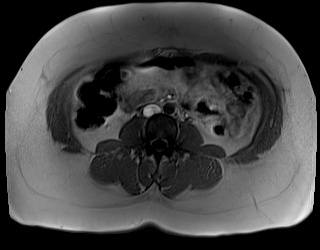
[im 40/80]
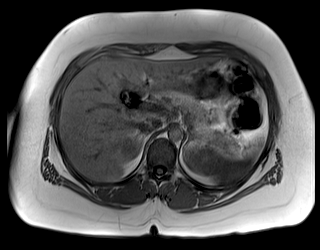
[im 80/80]
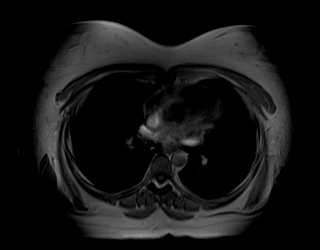

[Series 9: ax haste bh · axial · 6.0mm · 1.25mm/px · 1 of 35 slices shown]
[im 1/35]
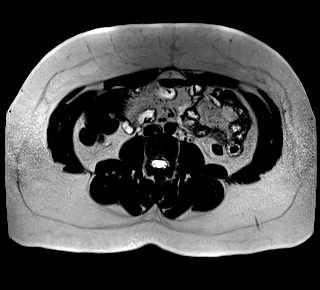

[Series 12: MRCP · coronal · 4.0mm · 1.12mm/px · 1 of 17 slices shown]
[im 1/17]
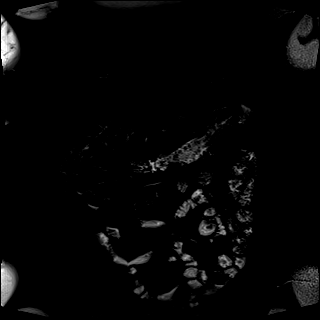

[Series 14: T1 dynamic · axial · 3.0mm · 1.25mm/px · z∈[-106,+131]mm · 3 of 80 slices shown (1 of 7)]
[im 1/80]
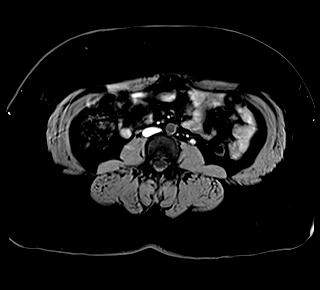
[im 40/80]
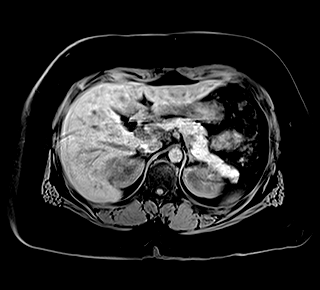
[im 80/80]
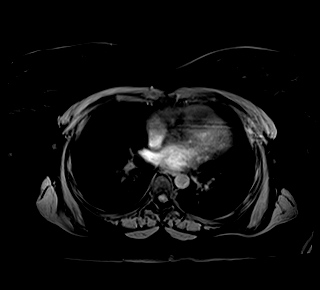

[Series 15: T1 dynamic · axial · 3.0mm · 1.25mm/px · z∈[-106,+131]mm · 3 of 80 slices shown (2 of 7)]
[im 1/80]
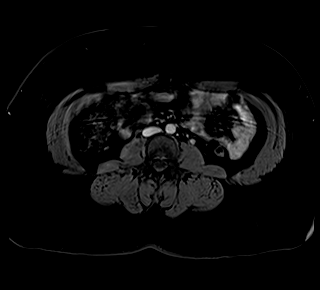
[im 40/80]
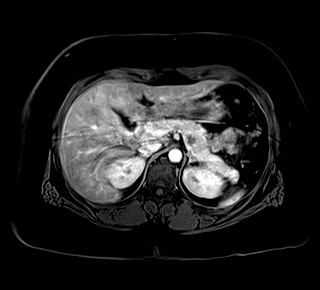
[im 80/80]
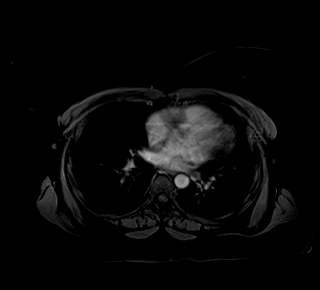

[Series 16: T1 dynamic · axial · 3.0mm · 1.25mm/px · z∈[-106,+131]mm · 3 of 80 slices shown (3 of 7)]
[im 1/80]
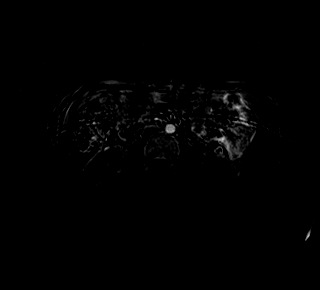
[im 40/80]
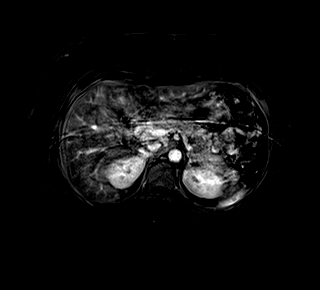
[im 80/80]
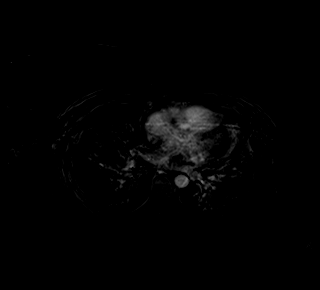

[Series 17: T1 dynamic · axial · 3.0mm · 1.25mm/px · z∈[-106,+131]mm · 3 of 80 slices shown (4 of 7)]
[im 1/80]
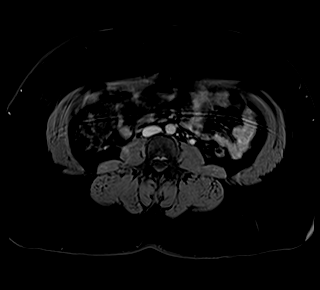
[im 40/80]
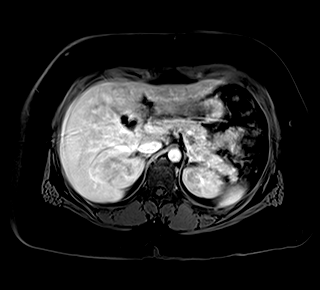
[im 80/80]
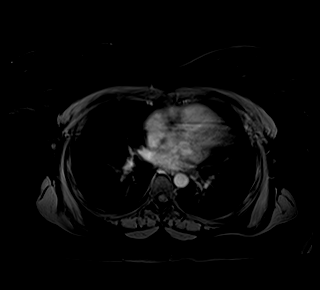

[Series 18: T1 dynamic · axial · 3.0mm · 1.25mm/px · z∈[-106,+131]mm · 3 of 80 slices shown (5 of 7)]
[im 1/80]
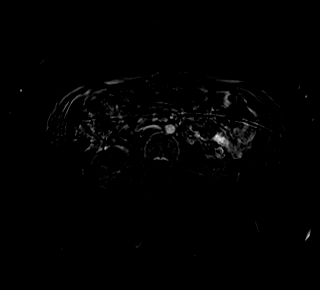
[im 40/80]
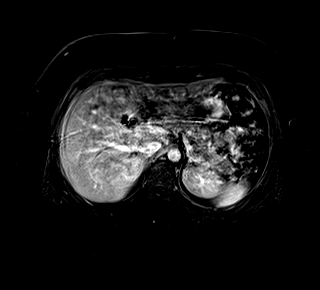
[im 80/80]
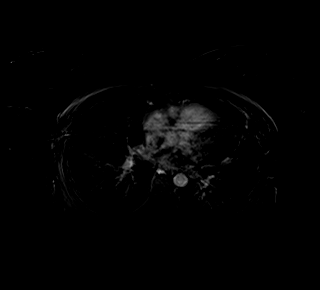

[Series 19: T1 dynamic · axial · 3.0mm · 1.25mm/px · z∈[-106,+131]mm · 3 of 80 slices shown (6 of 7)]
[im 1/80]
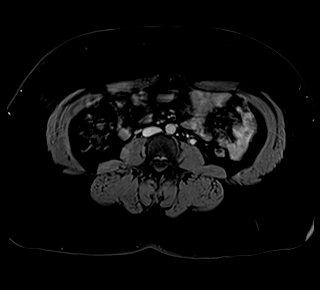
[im 40/80]
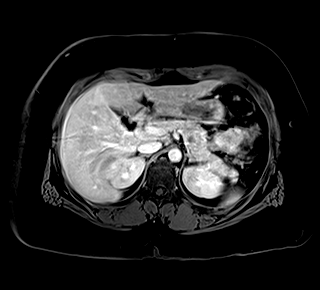
[im 80/80]
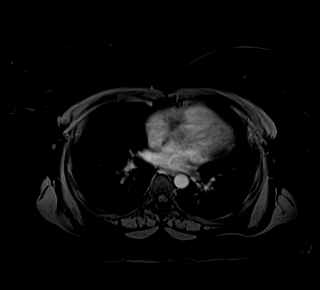

[Series 20: T1 dynamic · axial · 3.0mm · 1.25mm/px · z∈[-106,+131]mm · 3 of 80 slices shown (7 of 7)]
[im 1/80]
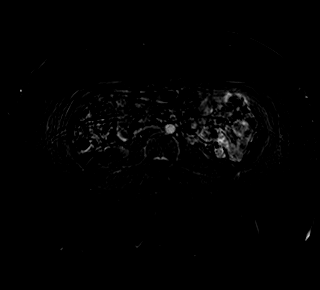
[im 40/80]
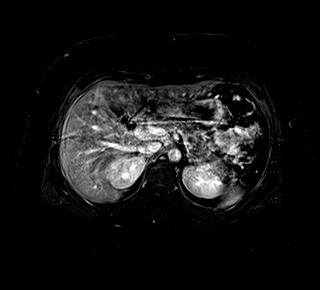
[im 80/80]
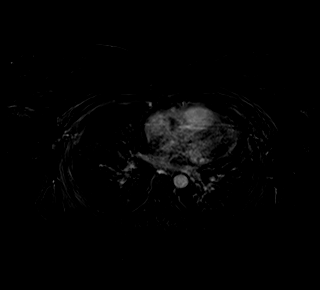

[Series 21: T1 dynamic post-contrast · coronal · 3.0mm · 1.31mm/px · 3 of 72 slices shown (1 of 3)]
[im 1/72]
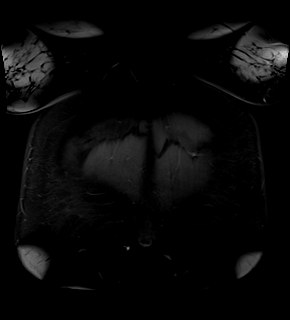
[im 36/72]
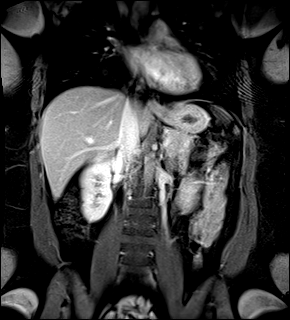
[im 72/72]
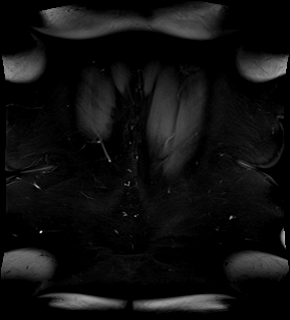

[Series 22: T1 dynamic post-contrast · axial · 3.0mm · 1.25mm/px · z∈[-106,+131]mm · 3 of 80 slices shown (2 of 3)]
[im 1/80]
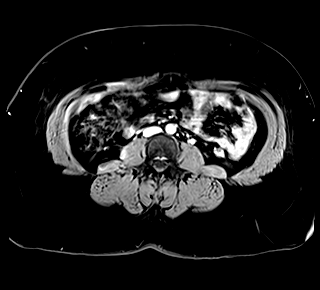
[im 40/80]
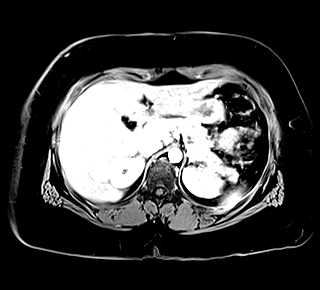
[im 80/80]
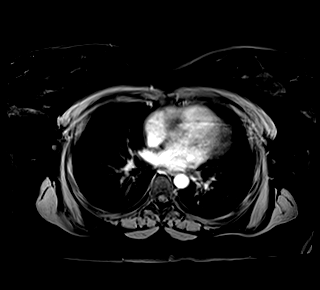

[Series 23: T1 dynamic post-contrast · axial · 3.0mm · 1.25mm/px · z∈[-106,+131]mm · 3 of 80 slices shown (3 of 3)]
[im 1/80]
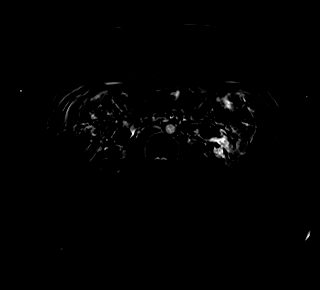
[im 40/80]
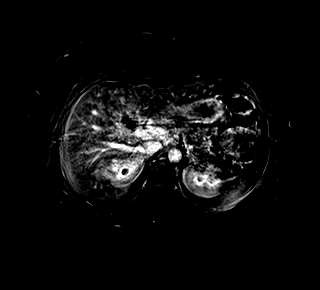
[im 80/80]
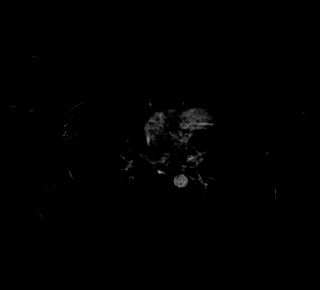

[43 of 48 positions shown; findings below may reference images not displayed]

FINDINGS: Lower chest: No acute findings.

Hepatobiliary: Liver is upper normal size with normal contour.
Subcentimeter cyst at the anterior margin of the liver noted. No
suspicious hepatic mass visualized. No evidence of hepatic
steatosis. Gallbladder is surgically absent. No biliary ductal
dilatation identified.

Pancreas: 1.7 x 1.4 cm well-defined simple appearing cyst in the
body of the pancreas, unchanged. No definite communication with the
pancreatic duct visualized. No suspicious enhancement or nodularity.
Main pancreatic duct is normal caliber.

Spleen:  Within normal limits in size and appearance.

Adrenals/Urinary Tract: Adrenal glands appear normal. Kidneys appear
normal.

Stomach/Bowel: No evidence of bowel obstruction.

Vascular/Lymphatic: No pathologically enlarged lymph nodes
identified. No abdominal aortic aneurysm demonstrated.

Other:  No ascites.

Musculoskeletal: No suspicious bone lesions identified.
IMPRESSION: Stable 1.7 cm simple appearing cyst in the body of the pancreas. No
new suspicious features. Recommend continued follow-up MRI with
contrast in 6 months.

## 2021-04-21 MED ORDER — GADOBUTROL 1 MMOL/ML IV SOLN
10.0000 mL | Freq: Once | INTRAVENOUS | Status: AC | PRN
  Administered 2021-04-21: 10 mL via INTRAVENOUS

## 2021-04-28 ENCOUNTER — Encounter: Payer: Self-pay | Admitting: Family Medicine

## 2021-04-28 NOTE — Telephone Encounter (Signed)
Some of the acid reflux issues could be related to slowing down in the intestines related to [facility].  Continue to current acid medication.  May add famotidine 20 mg each evening, #30, 5 refills ? ?In addition to this minimize chocolate base tomato based products and spicy foods ?It is best to sleep with a wedge ?Small meals frequently rather than any large meal ?Follow-up office visit at least every 3 months with [facility] ?Follow-up sooner if any ongoing troubles with regurgitation and vomiting ? ?

## 2021-04-29 ENCOUNTER — Other Ambulatory Visit (HOSPITAL_COMMUNITY): Payer: Self-pay

## 2021-04-29 ENCOUNTER — Other Ambulatory Visit: Payer: Self-pay | Admitting: Nurse Practitioner

## 2021-04-29 MED ORDER — WEGOVY 1.7 MG/0.75ML ~~LOC~~ SOAJ
1.7000 mg | SUBCUTANEOUS | 1 refills | Status: DC
  Filled 2021-04-29: qty 3, 28d supply, fill #0
  Filled 2021-04-29: qty 3, fill #0
  Filled 2021-05-31: qty 3, 28d supply, fill #1

## 2021-04-29 MED ORDER — WEGOVY 1 MG/0.5ML ~~LOC~~ SOAJ
1.0000 mg | SUBCUTANEOUS | 2 refills | Status: DC
  Filled 2021-04-29: qty 2, 28d supply, fill #0

## 2021-04-29 MED ORDER — FAMOTIDINE 20 MG PO TABS
20.0000 mg | ORAL_TABLET | Freq: Every evening | ORAL | 5 refills | Status: DC
  Filled 2021-04-29: qty 30, 30d supply, fill #0

## 2021-05-02 ENCOUNTER — Ambulatory Visit: Payer: Medicaid Other | Admitting: Family Medicine

## 2021-05-09 ENCOUNTER — Other Ambulatory Visit (HOSPITAL_COMMUNITY): Payer: Self-pay

## 2021-05-12 ENCOUNTER — Other Ambulatory Visit: Payer: No Typology Code available for payment source | Admitting: Advanced Practice Midwife

## 2021-05-16 ENCOUNTER — Ambulatory Visit: Payer: No Typology Code available for payment source | Admitting: Psychology

## 2021-05-31 ENCOUNTER — Other Ambulatory Visit (HOSPITAL_COMMUNITY): Payer: Self-pay

## 2021-05-31 ENCOUNTER — Encounter: Payer: Self-pay | Admitting: Family Medicine

## 2021-05-31 MED ORDER — WEGOVY 2.4 MG/0.75ML ~~LOC~~ SOAJ
2.4000 mg | SUBCUTANEOUS | 0 refills | Status: DC
  Filled 2021-05-31 – 2021-08-17 (×5): qty 3, 28d supply, fill #0

## 2021-05-31 NOTE — Telephone Encounter (Signed)
Nurses-please send in some additional refills May send in for refills thank you ?

## 2021-06-01 ENCOUNTER — Telehealth: Payer: Self-pay | Admitting: Family Medicine

## 2021-06-01 NOTE — Telephone Encounter (Signed)
Please put her on my list for today of office visits no charge ?Under the heading of the weight check ?

## 2021-06-03 NOTE — Telephone Encounter (Signed)
Patient notified that initial PA was denied and we are submitting an appeal ?

## 2021-06-06 ENCOUNTER — Telehealth: Payer: Self-pay | Admitting: *Deleted

## 2021-06-06 ENCOUNTER — Other Ambulatory Visit (HOSPITAL_COMMUNITY): Payer: Self-pay

## 2021-06-06 ENCOUNTER — Telehealth: Payer: No Typology Code available for payment source | Admitting: Nurse Practitioner

## 2021-06-06 DIAGNOSIS — Z8619 Personal history of other infectious and parasitic diseases: Secondary | ICD-10-CM

## 2021-06-06 MED ORDER — VALACYCLOVIR HCL 1 G PO TABS
2000.0000 mg | ORAL_TABLET | Freq: Two times a day (BID) | ORAL | 0 refills | Status: AC
Start: 2021-06-06 — End: 2021-06-07
  Filled 2021-06-06: qty 4, 1d supply, fill #0

## 2021-06-06 NOTE — Progress Notes (Signed)
?Virtual Visit Consent  ? ?[name], you are scheduled for a virtual visit with a [facility] provider today.   ?  ?Just as with appointments in the office, your consent must be obtained to participate.  Your consent will be active for this visit and any virtual visit you may have with one of our providers in the next 365 days.   ?  ?If you have a MyChart account, a copy of this consent can be sent to you electronically.  All virtual visits are billed to your insurance company just like a traditional visit in the office.   ? ?As this is a virtual visit, video technology does not allow for your provider to perform a traditional examination.  This may limit your provider's ability to fully assess your condition.  If your provider identifies any concerns that need to be evaluated in person or the need to arrange testing (such as labs, EKG, etc.), we will make arrangements to do so.   ?  ?Although advances in technology are sophisticated, we cannot ensure that it will always work on either your end or our end.  If the connection with a video visit is poor, the visit may have to be switched to a telephone visit.  With either a video or telephone visit, we are not always able to ensure that we have a secure connection.    ? ?Also, by engaging in this virtual visit, you consent to the provision of healthcare. Additionally, you authorize for your insurance to be billed (if applicable) for the services provided during this visit.  ? ?I need to obtain your verbal consent now.   Are you willing to proceed with your visit today?  ?  ?Brandi Dunn has provided verbal consent on 06/06/2021 for a virtual visit (video or telephone). ?  ?[name], FNP  ? ?Date: 06/06/2021 8:29 AM ? ? ?Virtual Visit via Video Note  ? ?Brandi Dunn, connected with  Brandi Dunn  ([id number], 1980-12-07) on 06/06/21 at  8:30 AM EDT by a video-enabled telemedicine application and verified that I am speaking with the correct person  using two identifiers. ? ?Location: ?Patient: Virtual Visit Location Patient: Home ?Provider: Virtual Visit Location Provider: Home Office ?  ?I discussed the limitations of evaluation and management by telemedicine and the availability of in person appointments. The patient expressed understanding and agreed to proceed.   ? ?History of Present Illness: ?Brandi Dunn is a 41 y.o. 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 She started to have the tingling feeling in her lips day prior.  ? ?She started that last night and wants to jump on valtrex before it fully onsets.  ? ?She has had cold sores for the past 8 years  ? ? ?Problems:  ?Patient Active Problem List  ? Diagnosis Date Noted  ? Morbid obesity ([place]) 01/03/2021  ? GAD (generalized anxiety disorder) 06/17/2020  ? Yeast infection 12/01/2019  ? Hypercalcemia 11/19/2019  ? Hyperparathyroidism, primary ([place]) 11/05/2019  ? Female pelvic pain 04/06/2016  ? Vitamin D deficiency 08/23/2015  ? GERD (gastroesophageal reflux disease) 08/18/2015  ? Migraine 08/18/2015  ? Encounter for IUD insertion 03/10/2015  ?  ?Allergies: No Known Allergies ?Medications:  ?Current Outpatient Medications:  ?  diazepam (VALIUM) 5 MG tablet, Take 1-2 tablets (5-10 mg total) by mouth one hour before procedure. Do not drive with mediaction-caution drowsiness., Disp: 4  tablet, Rfl: 0 ?  famotidine (PEPCID) 20 MG tablet, Take 1 tablet (20 mg total) by mouth every evening., Disp: 30 tablet, Rfl: 5 ?  fluticasone (FLONASE) 50 MCG/ACT nasal spray, Place 2 sprays into both nostrils daily., Disp: , Rfl:  ?  levonorgestrel (MIRENA) 20 MCG/24HR IUD, 1 each by Intrauterine route once., Disp: , Rfl:  ?  omeprazole (PRILOSEC) 40 MG capsule, Take 1 capsule (40 mg total) by mouth daily 30 minutes before breakfast with water., Disp: 30 capsule, Rfl: 3 ?  omeprazole (PRILOSEC) 40 MG capsule, Take 1 capsule (40 mg total) by mouth daily.,  Disp: 90 capsule, Rfl: 1 ?  Semaglutide-Weight Management (WEGOVY) 1.7 MG/0.75ML SOAJ, Inject 1.7 mg into the skin once a week., Disp: 3 mL, Rfl: 1 ?  Semaglutide-Weight Management (WEGOVY) 2.4 MG/0.75ML SOAJ, Inject 2.4 mg into the skin once a week., Disp: 3 mL, Rfl: 0 ?  sertraline (ZOLOFT) 100 MG tablet, Take 1 tablet (100 mg total) by mouth daily., Disp: 90 tablet, Rfl: 1 ?  Vitamin D, Ergocalciferol, (DRISDOL) 1.25 MG (50000 UNIT) CAPS capsule, TAKE 1 CAPSULE BY MOUTH EVERY 7 DAYS, Disp: 4 capsule, Rfl: 0 ? ?Observations/Objective: ?Patient is well-developed, well-nourished in no acute distress.  ?Resting comfortably  at home.  ?Head is normocephalic, atraumatic.  ?No labored breathing.  ?Speech is clear and coherent with logical content.  ?Patient is alert and oriented at baseline.  ? ? ?Assessment and Plan: ?1. H/O cold sores ? ?- valACYclovir (VALTREX) 1000 MG tablet; Take 2 tablets (2,000 mg total) by mouth 2 (two) times daily for 1 day.  Dispense: 4 tablet; Refill: 0 ?   ? ?Follow Up Instructions: ?I discussed the assessment and treatment plan with the patient. The patient was provided an opportunity to ask questions and all were answered. The patient agreed with the plan and demonstrated an understanding of the instructions.  A copy of instructions were sent to the patient via MyChart unless otherwise noted below.  ? ?The patient was advised to call back or seek an in-person evaluation if the symptoms worsen or if the condition fails to improve as anticipated. ? ?Time:  ?I spent 10 minutes with the patient via telehealth technology discussing the above problems/concerns.   ?infection.   ? ?[name], FNP  ?

## 2021-06-06 NOTE — Telephone Encounter (Signed)
Called patient's insurance and they would not process appeal unless had records confirming patient has lost the 5% body weight since starting medication. It was was stated patient is eligible to try a different weight loss medication such as Saxenda ?

## 2021-06-07 NOTE — Telephone Encounter (Signed)
See my chart message. Patient interested in trying another medication for weight loss ?

## 2021-06-14 ENCOUNTER — Ambulatory Visit: Payer: Self-pay | Admitting: Psychology

## 2021-06-14 ENCOUNTER — Other Ambulatory Visit (HOSPITAL_COMMUNITY): Payer: Self-pay

## 2021-06-14 MED ORDER — SAXENDA 18 MG/3ML ~~LOC~~ SOPN
PEN_INJECTOR | SUBCUTANEOUS | 0 refills | Status: DC
  Filled 2021-06-14: qty 15, 28d supply, fill #0
  Filled 2021-06-15: qty 15, 30d supply, fill #0

## 2021-06-14 NOTE — Telephone Encounter (Signed)
Nurses ? ?After discussion with patient and reviewing her messages it is recommended to utilize Saxenda ?0.6 mg subcutaneous daily every week May go up by 0.6 mg to a maximum of 3 mg subcutaneous daily if does not tolerate side effects then let us know side effects very similar to [facility]. ?Also very important to coordinate this with the pharmacy-I am not sure how the prescription should be written so that she can keep increasing by 0.6 mg every week ?Finally it would be wise to have a base weight ?Under current guidelines needs to lose 4 to 5 % body weight over 3 to 4 months time ?Very important to eat extremely healthy with portion control while on medication to help achieve this ?Otherwise healthcare/insurance will not continue allowing for this medication to be filled ?I would highly recommend office visit within 3 months for a follow-up ?

## 2021-06-14 NOTE — Telephone Encounter (Signed)
See my chart message with recommendations from Dr Nicki Reaper ?

## 2021-06-15 ENCOUNTER — Telehealth: Payer: Self-pay | Admitting: Family Medicine

## 2021-06-15 ENCOUNTER — Other Ambulatory Visit (HOSPITAL_COMMUNITY): Payer: Self-pay

## 2021-06-15 MED ORDER — INSULIN PEN NEEDLE 31G X 5 MM MISC
1.0000 | Freq: Every day | 3 refills | Status: DC
Start: 2021-06-15 — End: 2022-12-22
  Filled 2021-06-15: qty 100, 90d supply, fill #0

## 2021-06-15 NOTE — Telephone Encounter (Signed)
Prescription for pen needles called into Pharmacy. Patient notified. ?

## 2021-06-15 NOTE — Telephone Encounter (Signed)
Saxenda sent in patient has follow-up visit in [place] make sure patient is aware of the new medicine was sent in.  You may send her a MyChart message thank you ?

## 2021-06-15 NOTE — Telephone Encounter (Signed)
Patient has follow up scheduled 07/12/21 ?

## 2021-06-15 NOTE — Telephone Encounter (Signed)
Nurses-specialty pharmacy send [place] a message below asking for pen needles to be sent in for the patient and Saxenda shots-the shots are every day so therefore she will need pen needles for daily use thank you-please do ? ? ? ?Hey, the [place] PA has gone through for the patient - would you also send over a script for pen needles please! Thanks! ?

## 2021-06-15 NOTE — Telephone Encounter (Signed)
Kirke Shaggy PA has been approved from 06/15/21-10/13/21 ?

## 2021-06-15 NOTE — Telephone Encounter (Signed)
Patient is aware- see my chart message ?

## 2021-06-23 ENCOUNTER — Other Ambulatory Visit (HOSPITAL_COMMUNITY)
Admission: RE | Admit: 2021-06-23 | Discharge: 2021-06-23 | Disposition: A | Discharge status: Home / Self Care | Payer: No Typology Code available for payment source | Source: Ambulatory Visit | Attending: Advanced Practice Midwife | Admitting: Advanced Practice Midwife

## 2021-06-23 ENCOUNTER — Encounter: Payer: Self-pay | Admitting: Advanced Practice Midwife

## 2021-06-23 ENCOUNTER — Ambulatory Visit (INDEPENDENT_AMBULATORY_CARE_PROVIDER_SITE_OTHER): Payer: No Typology Code available for payment source | Admitting: Advanced Practice Midwife

## 2021-06-23 VITALS — BP 120/71 | HR 83 | Ht 64.0 in | Wt 245.5 lb

## 2021-06-23 DIAGNOSIS — N898 Other specified noninflammatory disorders of vagina: Secondary | ICD-10-CM

## 2021-06-23 DIAGNOSIS — Z01419 Encounter for gynecological examination (general) (routine) without abnormal findings: Secondary | ICD-10-CM | POA: Insufficient documentation

## 2021-06-23 DIAGNOSIS — K862 Cyst of pancreas: Secondary | ICD-10-CM

## 2021-06-23 NOTE — Progress Notes (Signed)
Brandi Dunn 41 y.o.  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   x2, [facility]   PARATHYROIDECTOMY Left 04/08/2020   Procedure: LEFT INFERIOR PARATHYROIDECTOMY;  Surgeon: Armandina Gemma, MD;  Location: [facility];  Service: General;  Laterality: Left;  LDOW ROOM 4  90 MIN   STONE EXTRACTION WITH BASKET Left 09/10/2012   Procedure: BALLOON DILATATION LEFT URETER; LEFT URETEROSCOPIC STONE EXTRACTION WITH BASKET;  Surgeon: Marissa Nestle, MD;  Location: [facility];  Service: Urology;  Laterality: Left;    Family History: Family History  Problem Relation Age of Onset   Hypertension Father    Hypertension Mother    Hyperlipidemia Mother    Anesthesia problems Neg Hx    Hypotension Neg Hx    Malignant hyperthermia Neg Hx    Pseudochol deficiency Neg Hx     Social History: Social History   Tobacco Use   Smoking status: Never   Smokeless tobacco: Never  Vaping Use   Vaping Use: Never used  Substance Use Topics   Alcohol use: No    Alcohol/week: 0.0 standard drinks   Drug use: No    Allergies: No Known Allergies    Current Outpatient Medications:    famotidine (PEPCID) 20 MG tablet, Take 1 tablet (20 mg total) by mouth every evening., Disp: 30 tablet, Rfl: 5   Insulin Pen Needle 31G  X 5 MM MISC, Use daily with saxenda, Disp: 100 each, Rfl: 3   levonorgestrel (MIRENA) 20 MCG/24HR IUD, 1 each by Intrauterine route once., Disp: , Rfl:    Liraglutide -Weight Management (SAXENDA) 18 MG/3ML SOPN, Inject 0.6 mg into the skin daily for 7 days, THEN 1.2 mg daily for 7 days, THEN 1.8 mg daily for 7 days, THEN 2.4 mg daily for 7 days, THEN 3 mg daily for 7 days., Disp: 15 mL, Rfl: 0   omeprazole (PRILOSEC) 40 MG capsule, Take 1 capsule (40 mg total) by mouth daily 30 minutes before breakfast with water., Disp: 30 capsule, Rfl: 3   sertraline (ZOLOFT) 100 MG tablet, Take 1 tablet (100 mg total) by mouth daily., Disp: 90 tablet, Rfl: 1   Vitamin D, Ergocalciferol, (DRISDOL) 1.25 MG (50000 UNIT) CAPS capsule, TAKE 1 CAPSULE BY MOUTH EVERY 7 DAYS, Disp: 4 capsule, Rfl: 0   fluticasone (FLONASE) 50 MCG/ACT nasal spray, Place 2 sprays into both nostrils daily. (Patient not taking: Reported on 06/23/2021), Disp: , Rfl:   History of Present Illness: here for pap and physical  Last pap 2020, normal.  Had Mirena switched out last year d/t ^ bleeding; still having long periods every month, but doesn't want to switch BC.  Had parathyroidectomy last year as well, voice came  back after EGD!Marland Kitchen Simple stable pancreatic cyst per MRI  Mammogram 6 months ago normal. Feels like she has a "strong vaginal odor" several hours after showering. Denies itch/burn/dishcarge   Review of Systems   Patient denies any headaches, blurred vision, shortness of breath, chest pain, abdominal pain, problems with bowel movements, urination, or intercourse.   Physical Exam: General:  Well developed, well nourished, no acute distress Skin:  Warm and dry Neck:  Midline trachea, normal thyroid Lungs; Clear to auscultation bilaterally Breast:  No dominant palpable mass, retraction, or nipple discharge Cardiovascular: Regular rate and rhythm Abdomen:  Soft, non tender, no hepatosplenomegaly Pelvic:  External genitalia is normal  in appearance.  The vagina is normal in appearance.  DC looks normal. No detectable odor. IUD string visibleThe cervix is bulbous.  Uterus is felt to be normal size, shape, and contour.  No adnexal masses or tenderness noted.  Extremities:  No swelling or varicosities noted Psych:  No mood changes.     Impression: normal GYN exam     Plan: try: BOric acid supp>probiotics>Lume (? Sweat) all separately to see if one helps w/odor. Swab for infection

## 2021-06-27 LAB — CERVICOVAGINAL ANCILLARY ONLY
Bacterial Vaginitis (gardnerella): NEGATIVE
Candida Glabrata: NEGATIVE
Candida Vaginitis: NEGATIVE
Comment: NEGATIVE
Comment: NEGATIVE
Comment: NEGATIVE

## 2021-06-28 LAB — CYTOLOGY - PAP
Chlamydia: NEGATIVE
Comment: NEGATIVE
Comment: NEGATIVE
Comment: NORMAL
Diagnosis: NEGATIVE
High risk HPV: NEGATIVE
Neisseria Gonorrhea: NEGATIVE

## 2021-07-08 ENCOUNTER — Other Ambulatory Visit (HOSPITAL_COMMUNITY): Payer: Self-pay

## 2021-07-12 ENCOUNTER — Encounter: Payer: Self-pay | Admitting: Family Medicine

## 2021-07-12 ENCOUNTER — Ambulatory Visit (INDEPENDENT_AMBULATORY_CARE_PROVIDER_SITE_OTHER): Payer: No Typology Code available for payment source | Admitting: Family Medicine

## 2021-07-12 VITALS — BP 110/70 | HR 84 | Temp 98.1°F | Wt 243.4 lb

## 2021-07-12 DIAGNOSIS — F411 Generalized anxiety disorder: Secondary | ICD-10-CM | POA: Diagnosis not present

## 2021-07-12 MED ORDER — SERTRALINE HCL 100 MG PO TABS
200.0000 mg | ORAL_TABLET | Freq: Every day | ORAL | 1 refills | Status: DC
  Filled 2021-07-12 – 2021-08-29 (×4): qty 180, 90d supply, fill #0
  Filled 2021-11-28: qty 180, 90d supply, fill #1

## 2021-07-12 NOTE — Progress Notes (Signed)
   Subjective:    Patient ID: Brandi Dunn, female    DOB: March 18, 1980, 41 y.o.   MRN: [id number]  HPI Pt presents today to follow up on anxiety and weight. Pt is currently on 2.4 mg Saxenda. Pt states Wegovy seemed to worked better.  Patient finds himself worrying frequently Gets anxious Finds himself having a hard time settling yourself down Is doing counseling Denies being depressed States sertraline really is not helping a whole lot She does try self with relaxation techniques  She has morbid obesity currently using Saxenda losing some weight but not a lot she thinks the medicine helps suppress the appetite some but not as much as she would like she does try to practice portion control and physical activity Pt not feeling any difference from Zoloft.    Review of Systems     Objective:   Physical Exam Lungs clear heart regular pulse normal BP good       Assessment & Plan:  1. GAD (generalized anxiety disorder) Continue counseling, continue self relaxation techniques, bump up dose of sertraline to 200 mg daily Give [place] feedback within 4 weeks how this is doing If it is not doing well enough to consider BuSpar May need psychiatry referral if progressive troubles occur    2. Morbid obesity ([place]) Morbid obesity Saxenda 3 mg daily Follow-up again in approximately 4 months Check weights periodically send [place] updates

## 2021-07-13 ENCOUNTER — Other Ambulatory Visit (HOSPITAL_COMMUNITY): Payer: Self-pay

## 2021-07-13 ENCOUNTER — Other Ambulatory Visit: Payer: Self-pay | Admitting: Family Medicine

## 2021-07-13 ENCOUNTER — Other Ambulatory Visit: Payer: Self-pay | Admitting: *Deleted

## 2021-07-13 MED ORDER — SAXENDA 18 MG/3ML ~~LOC~~ SOPN
3.0000 mg | PEN_INJECTOR | Freq: Every day | SUBCUTANEOUS | 4 refills | Status: DC
  Filled 2021-07-13: qty 3, fill #0

## 2021-07-13 MED ORDER — SAXENDA 18 MG/3ML ~~LOC~~ SOPN
3.0000 mg | PEN_INJECTOR | Freq: Every day | SUBCUTANEOUS | 4 refills | Status: DC
Start: 2021-07-13 — End: 2021-09-12
  Filled 2021-07-13: qty 15, 30d supply, fill #0

## 2021-07-13 NOTE — Progress Notes (Signed)
Prescription sent to pharmacy.

## 2021-07-14 ENCOUNTER — Other Ambulatory Visit (HOSPITAL_COMMUNITY): Payer: Self-pay

## 2021-07-14 ENCOUNTER — Encounter: Payer: Self-pay | Admitting: Family Medicine

## 2021-07-18 ENCOUNTER — Other Ambulatory Visit (HOSPITAL_COMMUNITY): Payer: Self-pay

## 2021-07-20 ENCOUNTER — Other Ambulatory Visit: Payer: Self-pay

## 2021-07-20 ENCOUNTER — Other Ambulatory Visit (HOSPITAL_COMMUNITY): Payer: Self-pay

## 2021-07-20 ENCOUNTER — Emergency Department (HOSPITAL_COMMUNITY)
Admission: EM | Admit: 2021-07-20 | Discharge: 2021-07-20 | Disposition: A | Discharge status: Home / Self Care | Payer: No Typology Code available for payment source | Attending: Emergency Medicine | Admitting: Emergency Medicine

## 2021-07-20 ENCOUNTER — Encounter (HOSPITAL_COMMUNITY): Payer: Self-pay | Admitting: *Deleted

## 2021-07-20 ENCOUNTER — Emergency Department (HOSPITAL_COMMUNITY): Payer: No Typology Code available for payment source

## 2021-07-20 DIAGNOSIS — R110 Nausea: Secondary | ICD-10-CM | POA: Diagnosis not present

## 2021-07-20 DIAGNOSIS — N200 Calculus of kidney: Secondary | ICD-10-CM

## 2021-07-20 DIAGNOSIS — R109 Unspecified abdominal pain: Secondary | ICD-10-CM | POA: Insufficient documentation

## 2021-07-20 LAB — COMPREHENSIVE METABOLIC PANEL
ALT: 14 U/L (ref 0–44)
AST: 13 U/L — ABNORMAL LOW (ref 15–41)
Albumin: 4 g/dL (ref 3.5–5.0)
Alkaline Phosphatase: 52 U/L (ref 38–126)
Anion gap: 7 (ref 5–15)
BUN: 10 mg/dL (ref 6–20)
CO2: 26 mmol/L (ref 22–32)
Calcium: 8.7 mg/dL — ABNORMAL LOW (ref 8.9–10.3)
Chloride: 105 mmol/L (ref 98–111)
Creatinine, Ser: 0.83 mg/dL (ref 0.44–1.00)
GFR, Estimated: >60 mL/min (ref >60)
Glucose, Bld: 92 mg/dL (ref 70–99)
Potassium: 3.4 mmol/L — ABNORMAL LOW (ref 3.5–5.1)
Sodium: 138 mmol/L (ref 135–145)
Total Bilirubin: 0.5 mg/dL (ref 0.3–1.2)
Total Protein: 7.3 g/dL (ref 6.5–8.1)

## 2021-07-20 LAB — CBC WITH DIFFERENTIAL/PLATELET
Abs Immature Granulocytes: 0.04 10*3/uL (ref 0.00–0.07)
Basophils Absolute: 0.1 10*3/uL (ref 0.0–0.1)
Basophils Relative: 1 %
Eosinophils Absolute: 0.1 10*3/uL (ref 0.0–0.5)
Eosinophils Relative: 1 %
HCT: 39.1 % (ref 36.0–46.0)
Hemoglobin: 12.5 g/dL (ref 12.0–15.0)
Immature Granulocytes: 1 %
Lymphocytes Relative: 31 %
Lymphs Abs: 2.5 10*3/uL (ref 0.7–4.0)
MCH: 28.7 pg (ref 26.0–34.0)
MCHC: 32 g/dL (ref 30.0–36.0)
MCV: 89.9 fL (ref 80.0–100.0)
Monocytes Absolute: 0.5 10*3/uL (ref 0.1–1.0)
Monocytes Relative: 6 %
Neutro Abs: 4.9 10*3/uL (ref 1.7–7.7)
Neutrophils Relative %: 60 %
Platelets: 238 10*3/uL (ref 150–400)
RBC: 4.35 MIL/uL (ref 3.87–5.11)
RDW: 13.3 % (ref 11.5–15.5)
WBC: 8 10*3/uL (ref 4.0–10.5)
nRBC: 0 % (ref 0.0–0.2)

## 2021-07-20 LAB — URINALYSIS, ROUTINE W REFLEX MICROSCOPIC
Bilirubin Urine: NEGATIVE
Glucose, UA: NEGATIVE mg/dL
Ketones, ur: NEGATIVE mg/dL
Leukocytes,Ua: NEGATIVE
Nitrite: NEGATIVE
Protein, ur: 100 mg/dL — AB
RBC / HPF: >50 RBC/hpf — ABNORMAL HIGH (ref 0–5)
Specific Gravity, Urine: 1.027 (ref 1.005–1.030)
pH: 6 (ref 5.0–8.0)

## 2021-07-20 LAB — LIPASE, BLOOD: Lipase: 32 U/L (ref 11–51)

## 2021-07-20 LAB — PREGNANCY, URINE: Preg Test, Ur: NEGATIVE

## 2021-07-20 IMAGING — CT CT RENAL STONE PROTOCOL
2 of 4 series · 16 of 46 positions shown, 18 images · non-contrast
Comparison: Multiple exams, including MRI abdomen [DATE] and CT
abdomen [DATE]

CLINICAL DATA: Right flank pain radiating around to the back,
starting today.



[Series 2: axial st · axial · 0.85mm/px · z∈[+872,+1282]mm · 13 of 94 slices shown, 15 images]
[im 6/94  soft-tissue]
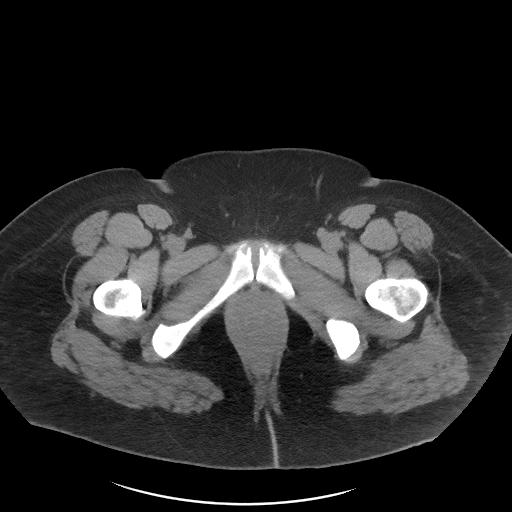
[im 6/94  bone]
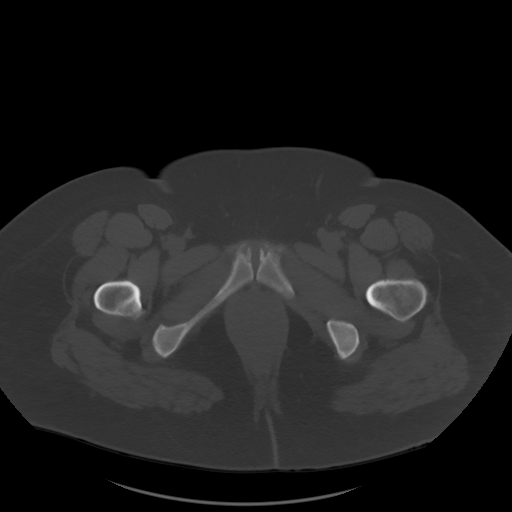
[im 12/94  soft-tissue]
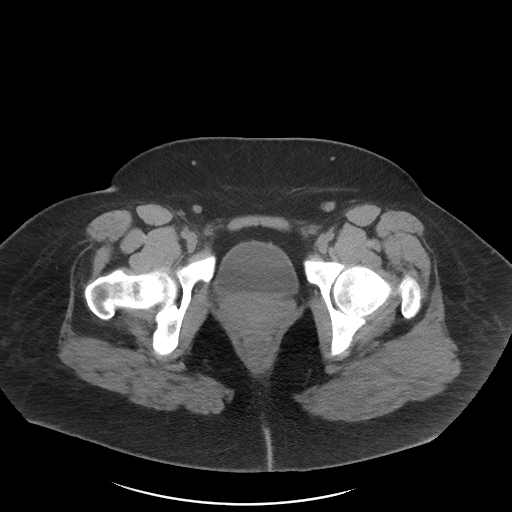
[im 18/94  soft-tissue]
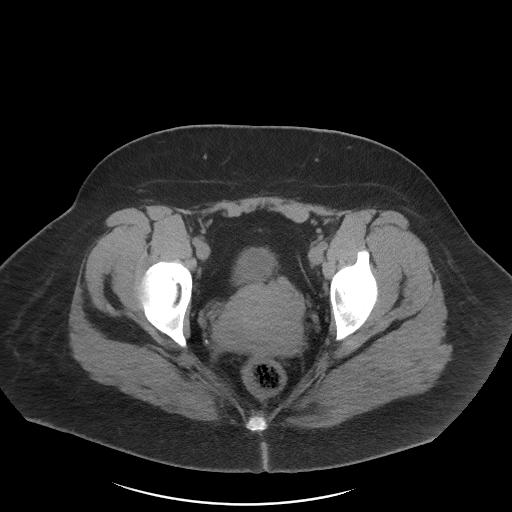
[im 30/94  soft-tissue]
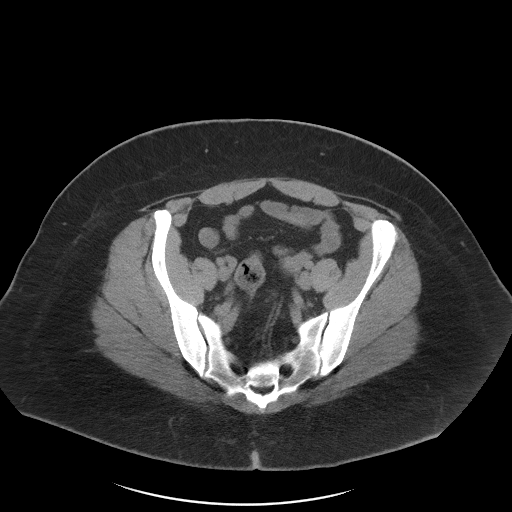
[im 35/94  soft-tissue]
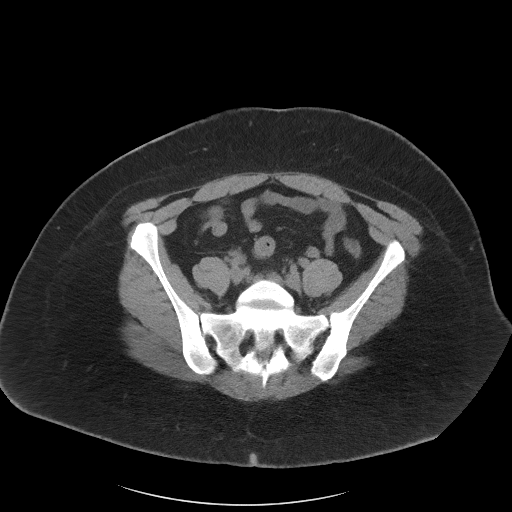
[im 41/94  soft-tissue]
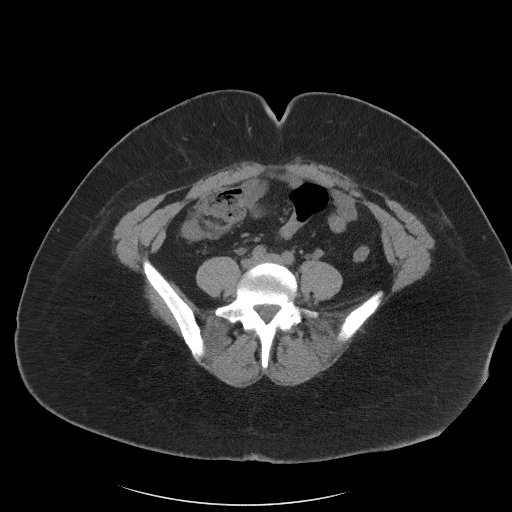
[im 47/94  soft-tissue]
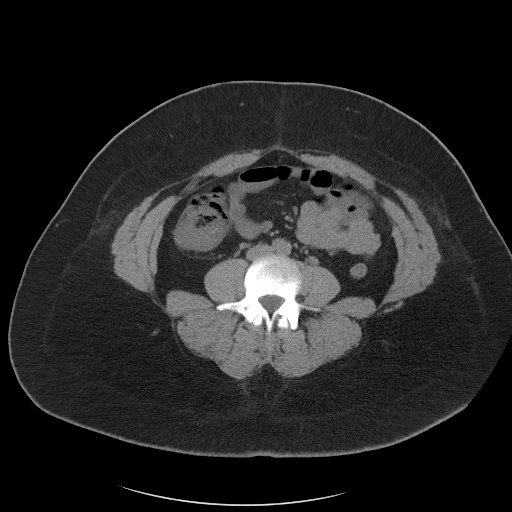
[im 53/94  soft-tissue]
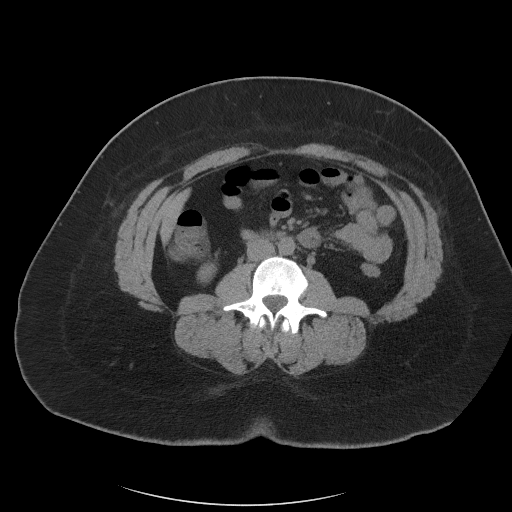
[im 59/94  soft-tissue]
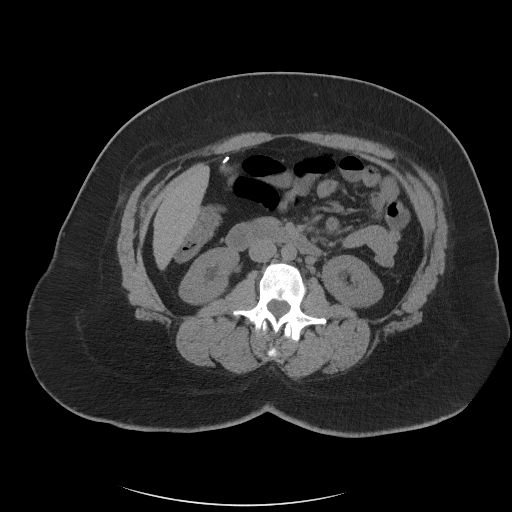
[im 59/94  bone]
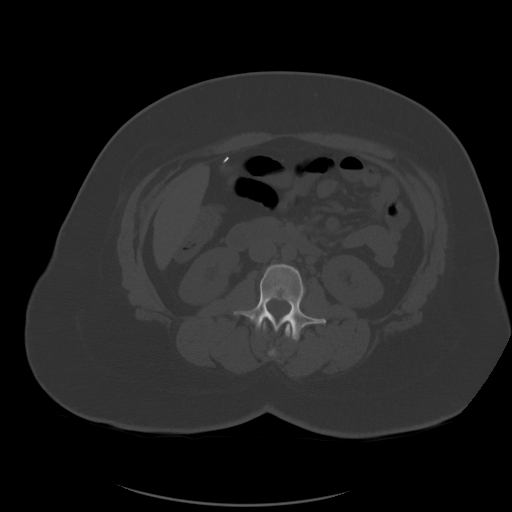
[im 64/94  soft-tissue]
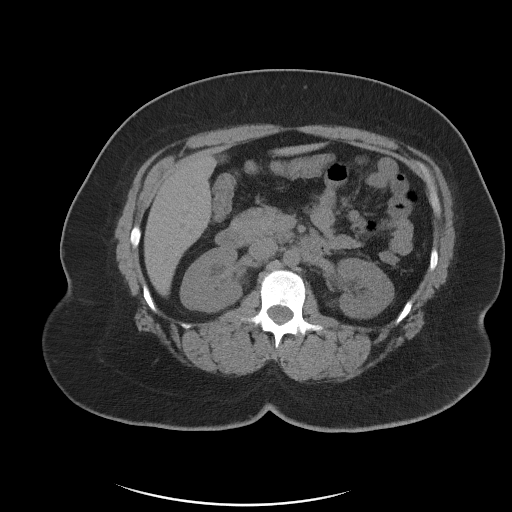
[im 76/94  soft-tissue]
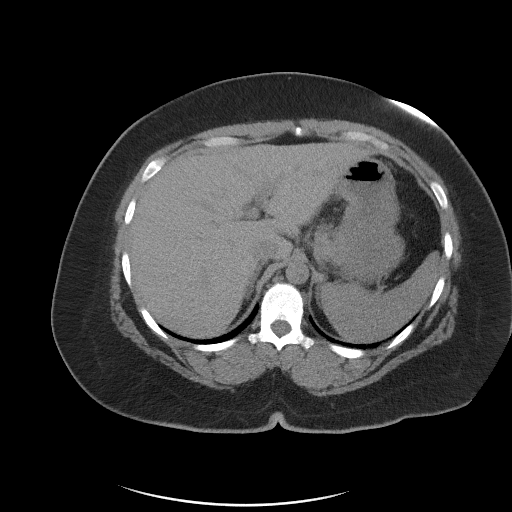
[im 82/94  soft-tissue]
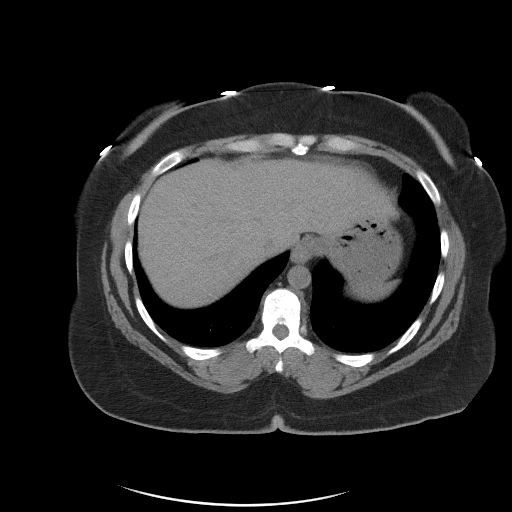
[im 88/94  soft-tissue]
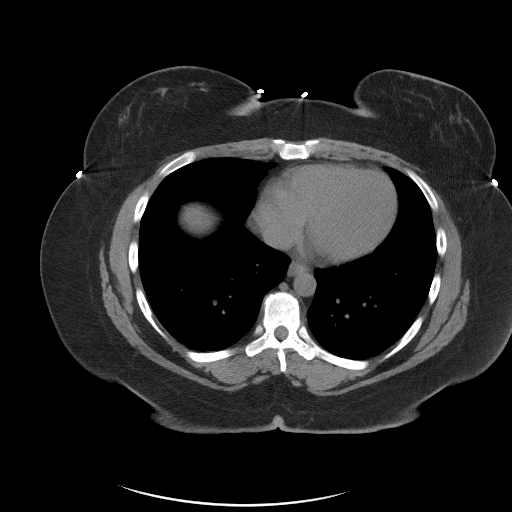

[Series 5: coronal st · coronal · 0.79mm/px · 3 of 106 slices shown]
[im 36/106  soft-tissue]
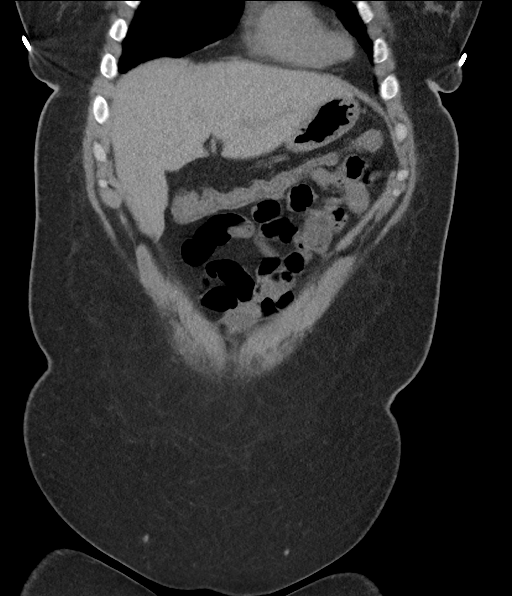
[im 47/106  soft-tissue]
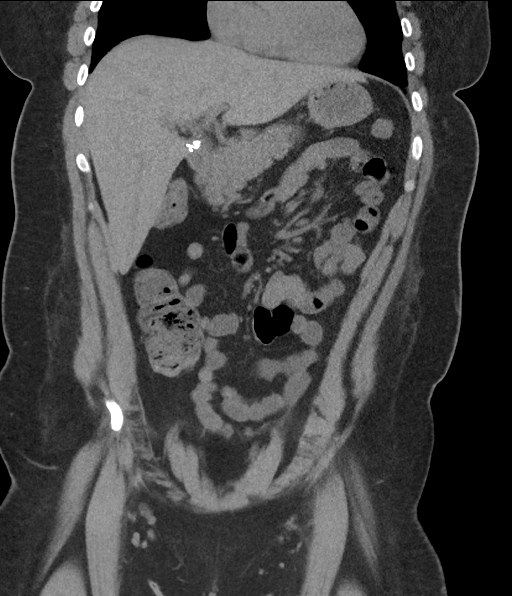
[im 59/106  soft-tissue]
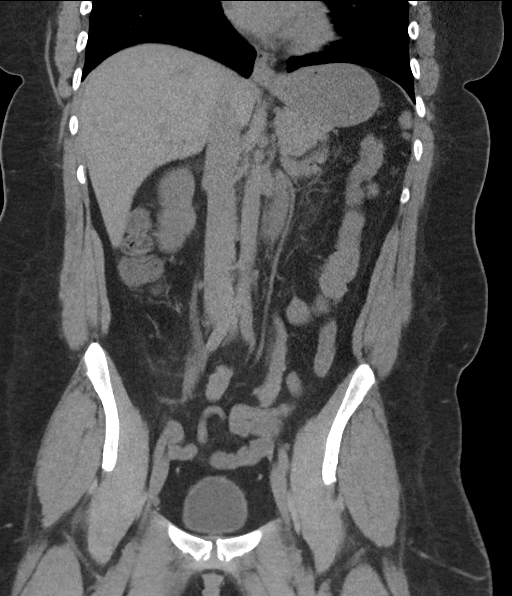

[16 of 46 positions shown; findings below may reference images not displayed]

FINDINGS: Lower chest: Unremarkable

Hepatobiliary: Cholecystectomy.  Otherwise unremarkable.

Pancreas: Stable cystic lesion along the tail the pancreas, this
followed with MRI examinations.

Spleen: Unremarkable

Adrenals/Urinary Tract: Both adrenal glands appear normal.

Mild right hydronephrosis and hydroureter extending down to a 0.3 cm
distal ureteral calculus located about 5.5 cm proximal to the right
UVJ.

Two nonobstructive right caliceal calculi are present with the
larger measuring 0.4 cm in length. There is punctate 0.2 cm left mid
to lower renal calculus which is nonobstructive.

Stomach/Bowel: Unremarkable.  Normal appendix.

Vascular/Lymphatic: Unremarkable

Reproductive: Retroverted uterus.  IUD satisfactorily position.

Other: No supplemental non-categorized findings.

Musculoskeletal: Unremarkable
IMPRESSION: 1. Mild right hydronephrosis and hydroureter extending down to a
cm distal ureteral calculus located about 5.5 cm proximal to the
right UVJ.
2. Small bilateral nonobstructive renal calculi noted.
3. Cystic lesion in the tail the pancreas, stable, this is being
followed with MRI examinations.

## 2021-07-20 MED ORDER — HYDROMORPHONE HCL 1 MG/ML IJ SOLN
1.0000 mg | Freq: Once | INTRAMUSCULAR | Status: AC
  Administered 2021-07-20: 1 mg via INTRAVENOUS
  Filled 2021-07-20: qty 1

## 2021-07-20 MED ORDER — TAMSULOSIN HCL 0.4 MG PO CAPS
0.4000 mg | ORAL_CAPSULE | Freq: Once | ORAL | Status: AC
  Administered 2021-07-20: 0.4 mg via ORAL
  Filled 2021-07-20: qty 1

## 2021-07-20 MED ORDER — OXYCODONE-ACETAMINOPHEN 5-325 MG PO TABS
1.0000 | ORAL_TABLET | Freq: Four times a day (QID) | ORAL | 0 refills | Status: DC | PRN
  Filled 2021-07-20: qty 15, 4d supply, fill #0

## 2021-07-20 MED ORDER — MORPHINE SULFATE (PF) 4 MG/ML IV SOLN
4.0000 mg | Freq: Once | INTRAVENOUS | Status: AC
  Administered 2021-07-20: 4 mg via INTRAVENOUS
  Filled 2021-07-20: qty 1

## 2021-07-20 MED ORDER — KETOROLAC TROMETHAMINE 30 MG/ML IJ SOLN
15.0000 mg | Freq: Once | INTRAMUSCULAR | Status: AC
Start: 2021-07-20 — End: 2021-07-20
  Administered 2021-07-20: 15 mg via INTRAVENOUS
  Filled 2021-07-20: qty 1

## 2021-07-20 MED ORDER — TAMSULOSIN HCL 0.4 MG PO CAPS
0.4000 mg | ORAL_CAPSULE | Freq: Every day | ORAL | 0 refills | Status: DC
  Filled 2021-07-20: qty 10, 10d supply, fill #0

## 2021-07-20 MED ORDER — LACTATED RINGERS IV BOLUS
1000.0000 mL | Freq: Once | INTRAVENOUS | Status: AC
  Administered 2021-07-20: 1000 mL via INTRAVENOUS

## 2021-07-20 MED ORDER — ONDANSETRON HCL 4 MG/2ML IJ SOLN
4.0000 mg | Freq: Once | INTRAMUSCULAR | Status: AC
  Administered 2021-07-20: 4 mg via INTRAVENOUS
  Filled 2021-07-20: qty 2

## 2021-07-20 MED ORDER — OXYCODONE-ACETAMINOPHEN 5-325 MG PO TABS: 1.0000 | ORAL_TABLET | Freq: Four times a day (QID) | ORAL | 0 refills | Status: DC | PRN

## 2021-07-20 MED ORDER — ONDANSETRON HCL 4 MG PO TABS
4.0000 mg | ORAL_TABLET | Freq: Four times a day (QID) | ORAL | 0 refills | Status: DC
  Filled 2021-07-20: qty 12, 3d supply, fill #0

## 2021-07-20 NOTE — ED Notes (Signed)
Pt given prepack percocets, signed and dated prescription to be placed in medication bin.

## 2021-07-20 NOTE — ED Provider Notes (Signed)
[facility] EMERGENCY DEPARTMENT Provider Note   CSN: [id number] Arrival date & time: 07/20/21  1711     History Chief Complaint  Patient presents with   Abdominal Pain    Brandi Dunn is a 41 y.o. female with history of GERD, anxiety depression presents emerged department for evaluation of right-sided mid abdominal pain that is gradually worsening today.  She reports she had some mild nausea otherwise no vomiting.  She felt like she may have been constipated otherwise had a bowel movement today that was normal.  She denies any hematochezia or any melena.  Denies any chest pain, shortness of breath, diarrhea, fever, dysuria, hematuria, urgency, frequency, back pain.  She does not know if this is a UTI or kidney stone.  She reports that 1 time she felt this when she developed a UTI but is unsure.  She is currently on Celexa for weight loss.  Surgical history includes cholecystectomy and parathyroid removal.  She is currently on Zoloft and omeprazole as well.  No known drug allergies.  Denies any tobacco, EtOH illicit drug use ever.   Abdominal Pain Associated symptoms: nausea   Associated symptoms: no chest pain, no chills, no constipation, no diarrhea, no dysuria, no fever, no hematuria, no shortness of breath, no vaginal bleeding, no vaginal discharge and no vomiting        Home Medications Prior to Admission medications   Medication Sig Start Date End Date Taking? Authorizing Provider  famotidine (PEPCID) 20 MG tablet Take 1 tablet (20 mg total) by mouth every evening. 04/29/21   Luking, Elayne Snare, MD  fluticasone (FLONASE) 50 MCG/ACT nasal spray Place 2 sprays into both nostrils daily. 09/21/20   [provider]  Insulin Pen Needle 31G X 5 MM MISC Use daily with saxenda 06/15/21   Kathyrn Drown, MD  levonorgestrel (MIRENA) 20 MCG/24HR IUD 1 each by Intrauterine route once.    [provider]  Liraglutide -Weight Management (SAXENDA) 18 MG/3ML SOPN Inject 3 mg into  the skin daily. 07/13/21   Kathyrn Drown, MD  omeprazole (PRILOSEC) 40 MG capsule Take 1 capsule (40 mg total) by mouth daily 30 minutes before breakfast with water. 01/28/21   Skotnicki, Meghan A, DO  sertraline (ZOLOFT) 100 MG tablet Take 2 tablets (200 mg total) by mouth daily. 07/12/21   Kathyrn Drown, MD  Vitamin D, Ergocalciferol, (DRISDOL) 1.25 MG (50000 UNIT) CAPS capsule TAKE 1 CAPSULE BY MOUTH EVERY 7 DAYS 07/07/20   Cassandria Anger, MD      Allergies    Patient has no known allergies.    Review of Systems   Review of Systems  Constitutional:  Negative for chills and fever.  Respiratory:  Negative for shortness of breath.   Cardiovascular:  Negative for chest pain.  Gastrointestinal:  Positive for abdominal pain and nausea. Negative for constipation, diarrhea and vomiting.  Genitourinary:  Negative for dysuria, frequency, hematuria, urgency, vaginal bleeding and vaginal discharge.  Musculoskeletal:  Negative for back pain.    Physical Exam Updated Vital Signs BP (!) 138/56   Pulse 86   Temp 98.1 F (36.7 C) (Oral)   Resp 20   Ht '5\' 4"'$  (1.626 m)   Wt 108.9 kg   SpO2 100%   BMI 41.20 kg/m  Physical Exam Vitals and nursing note reviewed.  Constitutional:      Appearance: Normal appearance. She is not toxic-appearing.     Comments: tearful  HENT:     Head: Normocephalic  and atraumatic.  Eyes:     General: No scleral icterus. Cardiovascular:     Rate and Rhythm: Normal rate and regular rhythm.  Pulmonary:     Effort: Pulmonary effort is normal.     Breath sounds: Normal breath sounds.  Abdominal:     General: Bowel sounds are normal.     Palpations: Abdomen is soft.     Tenderness: There is no abdominal tenderness. There is no right CVA tenderness, left CVA tenderness, guarding or rebound.     Comments: Abdominal exam limited secondary to body habitus.  Patient has no pain upon palpation of her abdomen to deep are soft.  She reports the pain is not reproduced  by palpation.  No guarding or rebound.  Normal active bowel sounds.  Abdomen soft.  Musculoskeletal:        General: No deformity.     Cervical back: Normal range of motion.  Skin:    General: Skin is warm and dry.  Neurological:     General: No focal deficit present.     Mental Status: She is alert. Mental status is at baseline.     ED Results / Procedures / Treatments   Labs (all labs ordered are listed, but only abnormal results are displayed) Labs Reviewed  URINE CULTURE  URINALYSIS, ROUTINE W REFLEX MICROSCOPIC  PREGNANCY, URINE  LIPASE, BLOOD  CBC WITH DIFFERENTIAL/PLATELET  COMPREHENSIVE METABOLIC PANEL    EKG None  Radiology No results found.  Procedures Procedures   Medications Ordered in ED Medications  morphine (PF) 4 MG/ML injection 4 mg (has no administration in time range)  ondansetron (ZOFRAN) injection 4 mg (has no administration in time range)    ED Course/ Medical Decision Making/ A&P                           Medical Decision Making Amount and/or Complexity of Data Reviewed Labs: ordered. Radiology: ordered.  Risk Prescription drug management.   41 year old female presents the emergency department for evaluation of right-sided mid abdominal pain.  Differential diagnosis includes is not limited to ovarian torsion, ovarian cyst, UTI, nephrolithiasis, appendicitis, small bowel obstruction.  Vital signs show mildly elevated blood pressure otherwise afebrile, normal pulse rate, satting well room air, increased work of breathing.  Physical exam is pertinent for tearful patient. Abdominal exam limited secondary to body habitus.  Patient has no pain upon palpation of her abdomen to deep are soft.  She reports the pain is not reproduced by palpation.  No guarding or rebound.  Normal active bowel sounds.  Abdomen soft.   We will start off with basic labs.  Morphine, Zofran, and 1L of LR fluids given.  I independently reviewed and interpreted the  patient's labs.  Urinalysis shows cloudy urine with large hemoglobin.  100 protein with greater than 50 red blood cells.  There is 11-20 white blood cells seen with rare bacteria.  0-5 squamous epithelial seen.  Pregnancy test negative.  CBC shows no leukocytosis or anemia.  CMP in process.  Lipase in process.  Urine culture pending.  We will order CT renal given the hematuria as well as the nonreproducible abdominal pain.  6:58 PM Care of ILYANA MANUELE transferred to PA Almyra Free Idol at the end of my shift as the patient will require reassessment once labs/imaging have resulted. Patient presentation, ED course, and plan of care discussed with review of all pertinent labs and imaging. Please see his/her note for  further details regarding further ED course and disposition. Plan at time of handoff is to follow-up on labs and CT imaging. This may be altered or completely changed at the discretion of the oncoming team pending results of further workup.  Final Clinical Impression(s) / ED Diagnoses Final diagnoses:  None    Rx / DC Orders ED Discharge Orders     None         Sherrell Puller, Hershal Coria 07/20/21 1905    Sherwood Gambler, MD 07/21/21 2126

## 2021-07-20 NOTE — ED Notes (Signed)
Pt ambulated to the restroom.

## 2021-07-20 NOTE — Discharge Instructions (Signed)
Strain your urine using the strainer given so that you will know when the stone passes.  Use the medications as prescribed, do not drive within 4 hours of taking oxycodone as this medication will make you drowsy.  Return here immediately if you develop fevers, uncontrolled vomiting or pain that is not controlled with the medications prescribed.  Take your next dose of the Flomax tomorrow evening as you have received today's dose here.

## 2021-07-20 NOTE — ED Triage Notes (Signed)
Pt with right abd pain that radiates around to back that started today. Denies any emesis or diarrhea. Denies any blood in urine.

## 2021-07-20 NOTE — ED Provider Notes (Signed)
Patient signed out to me at shift change pending CT imaging, patient with right lower abdominal pain consistent with prior kidney stone history.  CT scan confirms this finding.  She has a 3 mm stone approximately 5.5 cm proximal to the right UVJ.  She does not have a UTI.  She has been prescribed oxycodone for pain relief, Zofran in case oxycodone causes nausea, also Flomax.  She was given strict return precautions for fever, uncontrolled vomiting or uncontrolled pain.  She was also given referral to Dr. Alyson Ingles with [facility] for follow-up care.  She was given a urine strainer and advised to strain her urine and save any stone if she is successful in passing it for her urology appointment.   Results for orders placed or performed during the hospital encounter of 07/20/21  Urinalysis, Routine w reflex microscopic Urine, Clean Catch  Result Value Ref Range   Color, Urine YELLOW YELLOW   APPearance CLOUDY (A) CLEAR   Specific Gravity, Urine 1.027 1.005 - 1.030   pH 6.0 5.0 - 8.0   Glucose, UA NEGATIVE NEGATIVE mg/dL   Hgb urine dipstick LARGE (A) NEGATIVE   Bilirubin Urine NEGATIVE NEGATIVE   Ketones, ur NEGATIVE NEGATIVE mg/dL   Protein, ur 100 (A) NEGATIVE mg/dL   Nitrite NEGATIVE NEGATIVE   Leukocytes,Ua NEGATIVE NEGATIVE   RBC / HPF >50 (H) 0 - 5 RBC/hpf   WBC, UA 11-20 0 - 5 WBC/hpf   Bacteria, UA RARE (A) NONE SEEN   Squamous Epithelial / LPF 0-5 0 - 5   Mucus PRESENT   Pregnancy, urine  Result Value Ref Range   Preg Test, Ur NEGATIVE NEGATIVE  Lipase, blood  Result Value Ref Range   Lipase 32 11 - 51 U/L  CBC with Differential  Result Value Ref Range   WBC 8.0 4.0 - 10.5 K/uL   RBC 4.35 3.87 - 5.11 MIL/uL   Hemoglobin 12.5 12.0 - 15.0 g/dL   HCT 39.1 36.0 - 46.0 %   MCV 89.9 80.0 - 100.0 fL   MCH 28.7 26.0 - 34.0 pg   MCHC 32.0 30.0 - 36.0 g/dL   RDW 13.3 11.5 - 15.5 %   Platelets 238 150 - 400 K/uL   nRBC 0.0 0.0 - 0.2 %   Neutrophils Relative % 60 %   Neutro  Abs 4.9 1.7 - 7.7 K/uL   Lymphocytes Relative 31 %   Lymphs Abs 2.5 0.7 - 4.0 K/uL   Monocytes Relative 6 %   Monocytes Absolute 0.5 0.1 - 1.0 K/uL   Eosinophils Relative 1 %   Eosinophils Absolute 0.1 0.0 - 0.5 K/uL   Basophils Relative 1 %   Basophils Absolute 0.1 0.0 - 0.1 K/uL   Immature Granulocytes 1 %   Abs Immature Granulocytes 0.04 0.00 - 0.07 K/uL  Comprehensive metabolic panel  Result Value Ref Range   Sodium 138 135 - 145 mmol/L   Potassium 3.4 (L) 3.5 - 5.1 mmol/L   Chloride 105 98 - 111 mmol/L   CO2 26 22 - 32 mmol/L   Glucose, Bld 92 70 - 99 mg/dL   BUN 10 6 - 20 mg/dL   Creatinine, Ser 0.83 0.44 - 1.00 mg/dL   Calcium 8.7 (L) 8.9 - 10.3 mg/dL   Total Protein 7.3 6.5 - 8.1 g/dL   Albumin 4.0 3.5 - 5.0 g/dL   AST 13 (L) 15 - 41 U/L   ALT 14 0 - 44 U/L   Alkaline Phosphatase 52 38 -  126 U/L   Total Bilirubin 0.5 0.3 - 1.2 mg/dL   GFR, Estimated >60 >60 mL/min   Anion gap 7 5 - 15   CT Renal Stone Study  Result Date: 07/20/2021 CLINICAL DATA:  Right flank pain radiating around to the back, starting today. EXAM: CT ABDOMEN AND PELVIS WITHOUT CONTRAST TECHNIQUE: Multidetector CT imaging of the abdomen and pelvis was performed following the standard protocol without IV contrast. RADIATION DOSE REDUCTION: This exam was performed according to the departmental dose-optimization program which includes automated exposure control, adjustment of the mA and/or kV according to patient size and/or use of iterative reconstruction technique. COMPARISON:  Multiple exams, including MRI abdomen 04/21/2021 and CT abdomen 09/19/2019 FINDINGS: Lower chest: Unremarkable Hepatobiliary: Cholecystectomy.  Otherwise unremarkable. 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 Spleen: Unremarkable Adrenals/Urinary Tract: Both adrenal glands appear normal. Mild right hydronephrosis and hydroureter extending down to a 0.3 cm distal ureteral calculus  located about 5.5 cm proximal to the right UVJ. Two nonobstructive right caliceal calculi are present with the larger measuring 0.4 cm in length. There is punctate 0.2 cm left mid to lower renal calculus which is nonobstructive. Stomach/Bowel: Unremarkable.  Normal appendix. Vascular/Lymphatic: Unremarkable Reproductive: Retroverted uterus.  IUD satisfactorily position. Other: No supplemental non-categorized findings. Musculoskeletal: Unremarkable IMPRESSION: 1. Mild right hydronephrosis and hydroureter extending down to a 0.3 cm distal ureteral calculus located about 5.5 cm proximal to the right UVJ. 2. Small bilateral nonobstructive renal calculi noted. 3. 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 Electronically Signed   By: Van Clines M.D.   On: 07/20/2021 19:25       Landis Martins 07/20/21 2145    Sherwood Gambler, MD 07/21/21 2127

## 2021-07-21 ENCOUNTER — Other Ambulatory Visit (HOSPITAL_COMMUNITY): Payer: Self-pay

## 2021-07-21 LAB — URINE CULTURE: Special Requests: NORMAL

## 2021-07-21 MED FILL — Oxycodone w/ Acetaminophen Tab 5-325 MG: ORAL | Qty: 6 | Status: AC

## 2021-08-14 ENCOUNTER — Telehealth: Payer: Self-pay | Admitting: Family Medicine

## 2021-08-14 ENCOUNTER — Encounter: Payer: Self-pay | Admitting: Family Medicine

## 2021-08-14 NOTE — Telephone Encounter (Signed)
Nurses A letter to try to get [facility] approved was sent to [facility].  Please print and send forward to her as her insurance company   It is hard to know if they will approve.  But it is worth a try.   Thanks

## 2021-08-14 NOTE — Telephone Encounter (Signed)
Please see her most recent MyChart message She had requested that a letter of approval be sent to the insurance company to try to get [facility] reinstated I did do a letter Brandi Dunn can print it please forward to the insurance company thank you

## 2021-08-15 ENCOUNTER — Other Ambulatory Visit (HOSPITAL_COMMUNITY): Payer: Self-pay

## 2021-08-15 NOTE — Telephone Encounter (Signed)
Letter faxed to insurance fax number is [phone].

## 2021-08-16 ENCOUNTER — Other Ambulatory Visit (HOSPITAL_COMMUNITY): Payer: Self-pay

## 2021-08-16 ENCOUNTER — Telehealth: Payer: Self-pay

## 2021-08-17 ENCOUNTER — Ambulatory Visit: Payer: No Typology Code available for payment source | Admitting: Urology

## 2021-08-17 ENCOUNTER — Encounter: Payer: Self-pay | Admitting: Urology

## 2021-08-17 ENCOUNTER — Other Ambulatory Visit (HOSPITAL_COMMUNITY): Payer: Self-pay

## 2021-08-17 ENCOUNTER — Ambulatory Visit (INDEPENDENT_AMBULATORY_CARE_PROVIDER_SITE_OTHER): Payer: No Typology Code available for payment source | Admitting: Urology

## 2021-08-17 DIAGNOSIS — N201 Calculus of ureter: Secondary | ICD-10-CM | POA: Diagnosis not present

## 2021-08-17 DIAGNOSIS — N200 Calculus of kidney: Secondary | ICD-10-CM

## 2021-08-17 LAB — URINALYSIS, ROUTINE W REFLEX MICROSCOPIC
Bilirubin, UA: NEGATIVE
Glucose, UA: NEGATIVE
Ketones, UA: NEGATIVE
Nitrite, UA: NEGATIVE
Specific Gravity, UA: 1.02 (ref 1.005–1.030)
Urobilinogen, Ur: 1 mg/dL (ref 0.2–1.0)
pH, UA: 7 (ref 5.0–7.5)

## 2021-08-17 LAB — MICROSCOPIC EXAMINATION
Epithelial Cells (non renal): >10 /hpf — AB (ref 0–10)
Renal Epithel, UA: NONE SEEN /hpf

## 2021-08-17 MED ORDER — OXYCODONE-ACETAMINOPHEN 5-325 MG PO TABS
1.0000 | ORAL_TABLET | Freq: Four times a day (QID) | ORAL | 0 refills | Status: DC | PRN
  Filled 2021-08-17: qty 15, 4d supply, fill #0

## 2021-08-17 NOTE — Progress Notes (Signed)
Assessment: 1. Ureteral calculus, right; distal, 3 mm   2. Nephrolithiasis, bilateral     Plan: I reviewed the records from the ER visit on 07/20/2021.  I personally reviewed the CT study from 07/20/2021 showing a distal right ureteral calculus and bilateral renal calculi. Diagnosis and treatment options for a ureteral calculus including spontaneous stone passage and ureteroscopic stone manipulation were discussed with [name] in detail. She has elected to attempt to pass the ureteral calculus. Strain urine, Pain medication as needed.  Rx provided., and Alpha blocker for medical expulsive therapy.  Off label use discussed.  Use and side effects reviewed.  Rx provided. KUB today - will call with results Follow-up in 1 weeks  Return to office or ER for uncontrolled pain, vomiting, fever, or chills.   Chief Complaint:  Chief Complaint  Patient presents with   ureteral calculus    History of Present Illness:  Brandi Dunn is a 41 y.o. year old female who is seen in consultation from Kathyrn Drown, MD for evaluation of a right ureteral calculus.  She was seen in the emergency room on 07/20/2021 with right flank pain.  She had associated nausea without vomiting.  No fevers or chills.  Urinalysis showed >50 RBCs, 11-20 WBCs, and rare bacteria.  Urine culture grew multiple species.  WBC 8.0.  Creatinine normal. CT imaging demonstrated mild right hydronephrosis extending to a 3 mm calculus in the distal right ureter, 2 nonobstructive right calyceal calculi measuring 4 mm and a 2 mm left mid to lower nonobstructing renal calculus. She has had intermittent right-sided flank pain.  She had an episode of severe symptoms 2 days ago.  She is not taking pain medication on a regular basis.  She has been using Naprosyn as needed.  She has not been straining her urine and is not aware of passing a stone.  No dysuria or gross hematuria.  She has a prior history of kidney stones x2.  She passed  a stone and required ureteroscopic stone manipulation for a second stone. She has a history of hyperparathyroidism and has undergone a parathyroidectomy.  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   x2, [facility]   PARATHYROIDECTOMY Left 04/08/2020   Procedure: LEFT INFERIOR PARATHYROIDECTOMY;  Surgeon: Armandina Gemma, MD;  Location: [facility];  Service: General;  Laterality: Left;  LDOW ROOM 4  90 MIN   STONE EXTRACTION WITH BASKET Left 09/10/2012   Procedure: BALLOON DILATATION LEFT URETER; LEFT URETEROSCOPIC STONE EXTRACTION WITH BASKET;  Surgeon: Marissa Nestle, MD;  Location: [facility];  Service: Urology;  Laterality: Left;    Allergies:  No Known Allergies  Family History:  Family History  Problem Relation Age of Onset   Hypertension Father    Hypertension Mother    Hyperlipidemia Mother    Anesthesia problems Neg Hx    Hypotension  Neg Hx    Malignant hyperthermia Neg Hx    Pseudochol deficiency Neg Hx     Social History:  Social History   Tobacco Use   Smoking status: Never   Smokeless tobacco: Never  Vaping Use   Vaping Use: Never used  Substance Use Topics   Alcohol use: No    Alcohol/week: 0.0 standard drinks of alcohol   Drug use: No    Review of symptoms:  Constitutional:  Negative for unexplained weight loss, night sweats, fever, chills ENT:  Negative for nose bleeds, sinus pain, painful  swallowing CV:  Negative for chest pain, shortness of breath, exercise intolerance, palpitations, loss of consciousness Resp:  Negative for cough, wheezing, shortness of breath GI:  Negative for nausea, vomiting, diarrhea, bloody stools GU:  Positives noted in HPI; otherwise negative for gross hematuria, dysuria, urinary incontinence Neuro:  Negative for seizures, poor balance, limb weakness, slurred speech Psych:  Negative for lack of energy, depression, anxiety Endocrine:  Negative for polydipsia, polyuria, symptoms of hypoglycemia (dizziness, hunger, sweating) Hematologic:  Negative for anemia, purpura, petechia, prolonged or excessive bleeding, use of anticoagulants  Allergic:  Negative for difficulty breathing or choking as a result of exposure to anything; no shellfish allergy; no allergic response (rash/itch) to materials, foods  Physical exam: BP 112/74   Pulse 75  GENERAL APPEARANCE:  Well appearing, well developed, well nourished, NAD HEENT: Atraumatic, Normocephalic, oropharynx clear. NECK: Supple without lymphadenopathy or thyromegaly. LUNGS: Clear to auscultation bilaterally. HEART: Regular Rate and Rhythm without murmurs, gallops, or rubs. ABDOMEN: Soft, non-tender, No Masses. EXTREMITIES: Moves all extremities well.  Without clubbing, cyanosis, or edema. NEUROLOGIC:  Alert and oriented x 3, normal gait, CN II-XII grossly intact.  MENTAL STATUS:  Appropriate. BACK:  Non-tender to palpation.  No CVAT SKIN:  Warm, dry and intact.    Results: U/A: 6-10 WBCs, 0-2 RBCs, >10 epithelial cells, moderate bacteria, nitrite negative

## 2021-08-18 ENCOUNTER — Telehealth: Payer: Self-pay | Admitting: Family Medicine

## 2021-08-18 ENCOUNTER — Telehealth: Payer: No Typology Code available for payment source | Admitting: Physician Assistant

## 2021-08-18 ENCOUNTER — Ambulatory Visit (HOSPITAL_COMMUNITY)
Admission: RE | Admit: 2021-08-18 | Discharge: 2021-08-18 | Disposition: A | Discharge status: Home / Self Care | Payer: No Typology Code available for payment source | Source: Ambulatory Visit | Attending: Urology | Admitting: Urology

## 2021-08-18 ENCOUNTER — Other Ambulatory Visit (HOSPITAL_COMMUNITY): Payer: Self-pay

## 2021-08-18 DIAGNOSIS — R3989 Other symptoms and signs involving the genitourinary system: Secondary | ICD-10-CM | POA: Diagnosis not present

## 2021-08-18 DIAGNOSIS — N201 Calculus of ureter: Secondary | ICD-10-CM | POA: Insufficient documentation

## 2021-08-18 MED ORDER — SULFAMETHOXAZOLE-TRIMETHOPRIM 800-160 MG PO TABS
1.0000 | ORAL_TABLET | Freq: Two times a day (BID) | ORAL | 0 refills | Status: DC
  Filled 2021-08-18: qty 10, 5d supply, fill #0

## 2021-08-18 NOTE — Telephone Encounter (Signed)
Brandi Dunn appeal has been approved. Approved for a max of 7 fills from 08/17/21-03/08/22. My chart message sent to patient letting her know of approval

## 2021-08-18 NOTE — Progress Notes (Signed)

## 2021-08-22 ENCOUNTER — Other Ambulatory Visit (HOSPITAL_COMMUNITY): Payer: Self-pay

## 2021-08-25 ENCOUNTER — Ambulatory Visit (INDEPENDENT_AMBULATORY_CARE_PROVIDER_SITE_OTHER): Payer: No Typology Code available for payment source | Admitting: Urology

## 2021-08-25 ENCOUNTER — Other Ambulatory Visit (HOSPITAL_COMMUNITY): Payer: Self-pay

## 2021-08-25 ENCOUNTER — Encounter: Payer: Self-pay | Admitting: Urology

## 2021-08-25 VITALS — BP 104/64 | HR 90

## 2021-08-25 DIAGNOSIS — N201 Calculus of ureter: Secondary | ICD-10-CM

## 2021-08-25 DIAGNOSIS — N200 Calculus of kidney: Secondary | ICD-10-CM

## 2021-08-25 LAB — URINALYSIS, ROUTINE W REFLEX MICROSCOPIC
Bilirubin, UA: NEGATIVE
Glucose, UA: NEGATIVE
Ketones, UA: NEGATIVE
Nitrite, UA: NEGATIVE
Specific Gravity, UA: >1.03 — ABNORMAL HIGH (ref 1.005–1.030)
Urobilinogen, Ur: 1 mg/dL (ref 0.2–1.0)
pH, UA: 5.5 (ref 5.0–7.5)

## 2021-08-25 NOTE — Progress Notes (Signed)
Assessment: 1. Ureteral calculus, right; distal, 3 mm   2. Nephrolithiasis, bilateral     Plan: Diagnosis and treatment options for a ureteral calculus including spontaneous stone passage and ureteroscopic stone manipulation were discussed with Brandi Dunn in detail. She has elected to attempt to pass the ureteral calculus. Strain urine, Pain medication as needed.  Rx provided., and Alpha blocker for medical expulsive therapy.  Off label use discussed.  Use and side effects reviewed.  Rx provided. Follow-up in 10 -14 days. Return to office or ER for uncontrolled pain, vomiting, fever, or chills.   Chief Complaint:  Chief Complaint  Patient presents with   ureteral calculus    History of Present Illness:  Brandi Dunn is a 41 y.o. year old female who is seen for further evaluation of a right ureteral calculus.  She was seen in the emergency room on 07/20/2021 with right flank pain.  She had associated nausea without vomiting.  No fevers or chills.  Urinalysis showed >50 RBCs, 11-20 WBCs, and rare bacteria.  Urine culture grew multiple species.  WBC 8.0.  Creatinine normal. CT imaging demonstrated mild right hydronephrosis extending to a 3 mm calculus in the distal right ureter, 2 nonobstructive right calyceal calculi measuring 4 mm and a 2 mm left mid to lower nonobstructing renal calculus.  She has a prior history of kidney stones x2.  She passed a stone and required ureteroscopic stone manipulation for a second stone. She has a history of hyperparathyroidism and has undergone a parathyroidectomy.  KUB from 7/13 did not show an obvious stone in the area of the right distal ureter. She returns today for follow-up.  She has had 2 episodes of right-sided flank pain within the past week.  The most recent episode was 3 days ago.  Her pain lasted approximately 4-5 hours.  She had an episode of increased frequency and dysuria several days ago.  She is not aware of passing a stone and  has been straining her urine.  She continues on Flomax.  She is not having any flank pain today.  No fevers or chills.  No nausea or vomiting.  Portions of the above documentation were copied from a prior visit for review purposes only.   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   x2, [facility]   PARATHYROIDECTOMY Left 04/08/2020   Procedure: LEFT INFERIOR PARATHYROIDECTOMY;  Surgeon: Armandina Gemma, MD;  Location: [facility];  Service: General;  Laterality: Left;  LDOW ROOM 4  90 MIN   STONE EXTRACTION WITH BASKET Left 09/10/2012   Procedure: BALLOON DILATATION LEFT URETER; LEFT URETEROSCOPIC STONE EXTRACTION WITH BASKET;  Surgeon: Marissa Nestle, MD;  Location: [facility];  Service: Urology;  Laterality: Left;    Allergies:  No Known Allergies  Family History:  Family History  Problem Relation Age of Onset   Hypertension Father  Hypertension Mother    Hyperlipidemia Mother    Anesthesia problems Neg Hx    Hypotension Neg Hx    Malignant hyperthermia Neg Hx    Pseudochol deficiency Neg Hx     Social History:  Social History   Tobacco Use   Smoking status: Never   Smokeless tobacco: Never  Vaping Use   Vaping Use: Never used  Substance Use Topics   Alcohol use: No    Alcohol/week: 0.0 standard drinks of alcohol   Drug use: No    ROS: Constitutional:  Negative for fever, chills, weight loss CV:  Negative for chest pain, previous MI, hypertension Respiratory:  Negative for shortness of breath, wheezing, sleep apnea, frequent cough GI:  Negative for nausea, vomiting, bloody stool, GERD  Physical exam: BP 104/64   Pulse 90  GENERAL APPEARANCE:  Well appearing, well developed, well nourished, NAD HEENT:  Atraumatic, normocephalic, oropharynx clear NECK:  Supple without lymphadenopathy or thyromegaly ABDOMEN:  Soft, non-tender, no masses EXTREMITIES:  Moves all extremities well, without clubbing, cyanosis, or edema NEUROLOGIC:  Alert and oriented x 3, normal gait, CN II-XII grossly intact MENTAL STATUS:  appropriate BACK:  Non-tender to palpation, No CVAT SKIN:  Warm, dry, and intact  Results: U/A: dipstick 2+ blood

## 2021-08-29 ENCOUNTER — Other Ambulatory Visit (HOSPITAL_COMMUNITY): Payer: Self-pay

## 2021-09-01 ENCOUNTER — Other Ambulatory Visit: Payer: Self-pay | Admitting: Urology

## 2021-09-01 ENCOUNTER — Other Ambulatory Visit (HOSPITAL_COMMUNITY): Payer: Self-pay

## 2021-09-01 ENCOUNTER — Telehealth: Payer: Self-pay

## 2021-09-01 DIAGNOSIS — N201 Calculus of ureter: Secondary | ICD-10-CM

## 2021-09-01 MED ORDER — OXYCODONE-ACETAMINOPHEN 5-325 MG PO TABS
1.0000 | ORAL_TABLET | Freq: Four times a day (QID) | ORAL | 0 refills | Status: DC | PRN
  Filled 2021-09-01: qty 15, 4d supply, fill #0

## 2021-09-01 NOTE — Telephone Encounter (Signed)
Please see patient request.

## 2021-09-01 NOTE — Telephone Encounter (Signed)
Patient called wanting to know if pain medication could be called in. She advised she is still having pain due to kidney stones.      Pharmacy: [facility]     Thank you

## 2021-09-01 NOTE — Telephone Encounter (Signed)
Mychart message sent to patient.

## 2021-09-08 ENCOUNTER — Ambulatory Visit: Payer: No Typology Code available for payment source | Admitting: Urology

## 2021-09-12 ENCOUNTER — Other Ambulatory Visit: Payer: Self-pay | Admitting: Family Medicine

## 2021-09-12 ENCOUNTER — Other Ambulatory Visit (HOSPITAL_COMMUNITY): Payer: Self-pay

## 2021-09-12 ENCOUNTER — Encounter: Payer: Self-pay | Admitting: Family Medicine

## 2021-09-12 MED ORDER — WEGOVY 2.4 MG/0.75ML ~~LOC~~ SOAJ
2.4000 mg | SUBCUTANEOUS | 2 refills | Status: DC
  Filled 2021-09-12: qty 3, 28d supply, fill #0
  Filled 2021-10-11: qty 3, 28d supply, fill #1
  Filled 2021-11-07: qty 3, 28d supply, fill #2

## 2021-09-12 NOTE — Telephone Encounter (Signed)
Nurses-may have this +2 refills needs follow-up visit by this fall to check progress  Continue healthy eating Emphasizing vegetables fruits lean meats minimizing starchy foods staying away from sugary foods and sugary drinks  Fitting in walking on a regular basis

## 2021-10-11 ENCOUNTER — Other Ambulatory Visit (HOSPITAL_COMMUNITY): Payer: Self-pay

## 2021-10-12 ENCOUNTER — Ambulatory Visit: Payer: No Typology Code available for payment source | Admitting: Family Medicine

## 2021-10-14 ENCOUNTER — Other Ambulatory Visit: Payer: Self-pay | Admitting: *Deleted

## 2021-10-14 DIAGNOSIS — K862 Cyst of pancreas: Secondary | ICD-10-CM

## 2021-10-27 ENCOUNTER — Other Ambulatory Visit (HOSPITAL_COMMUNITY): Payer: Self-pay | Admitting: Family Medicine

## 2021-10-27 DIAGNOSIS — Z1231 Encounter for screening mammogram for malignant neoplasm of breast: Secondary | ICD-10-CM

## 2021-11-07 ENCOUNTER — Other Ambulatory Visit: Payer: Self-pay | Admitting: Family Medicine

## 2021-11-07 ENCOUNTER — Other Ambulatory Visit (HOSPITAL_COMMUNITY): Payer: Self-pay

## 2021-11-08 ENCOUNTER — Other Ambulatory Visit (HOSPITAL_COMMUNITY): Payer: Self-pay

## 2021-11-08 MED ORDER — OMEPRAZOLE 40 MG PO CPDR
40.0000 mg | DELAYED_RELEASE_CAPSULE | Freq: Every day | ORAL | 3 refills | Status: DC
  Filled 2021-11-08: qty 90, 90d supply, fill #0

## 2021-11-11 ENCOUNTER — Ambulatory Visit (HOSPITAL_COMMUNITY): Payer: No Typology Code available for payment source

## 2021-11-14 ENCOUNTER — Ambulatory Visit: Payer: No Typology Code available for payment source

## 2021-11-23 ENCOUNTER — Ambulatory Visit (HOSPITAL_COMMUNITY)
Admission: RE | Admit: 2021-11-23 | Discharge: 2021-11-23 | Disposition: A | Discharge status: Home / Self Care | Payer: No Typology Code available for payment source | Source: Ambulatory Visit | Attending: Family Medicine | Admitting: Family Medicine

## 2021-11-23 ENCOUNTER — Ambulatory Visit: Payer: No Typology Code available for payment source | Admitting: Family Medicine

## 2021-11-23 DIAGNOSIS — Z1231 Encounter for screening mammogram for malignant neoplasm of breast: Secondary | ICD-10-CM | POA: Diagnosis present

## 2021-11-25 ENCOUNTER — Ambulatory Visit (HOSPITAL_COMMUNITY): Payer: No Typology Code available for payment source

## 2021-11-28 ENCOUNTER — Other Ambulatory Visit (HOSPITAL_COMMUNITY): Payer: Self-pay

## 2021-11-29 ENCOUNTER — Ambulatory Visit: Payer: No Typology Code available for payment source | Admitting: Podiatry

## 2021-12-11 ENCOUNTER — Encounter: Payer: Self-pay | Admitting: Family Medicine

## 2021-12-15 NOTE — Telephone Encounter (Signed)
So currently this is a conundrum In other words it seems like the next best step in this process would be for Juvia to do a follow-up visit her convenience somewhere within the next 45 days and at that time we can get the ball rolling again to get MRI approved and covered

## 2022-04-03 ENCOUNTER — Other Ambulatory Visit (HOSPITAL_COMMUNITY): Payer: Self-pay

## 2022-04-03 ENCOUNTER — Encounter: Payer: Self-pay | Admitting: Family Medicine

## 2022-04-07 ENCOUNTER — Other Ambulatory Visit (HOSPITAL_COMMUNITY): Payer: Self-pay

## 2022-04-12 ENCOUNTER — Ambulatory Visit: Payer: BC Managed Care – PPO | Admitting: Family Medicine

## 2022-04-12 DIAGNOSIS — K219 Gastro-esophageal reflux disease without esophagitis: Secondary | ICD-10-CM | POA: Diagnosis not present

## 2022-04-12 DIAGNOSIS — R229 Localized swelling, mass and lump, unspecified: Secondary | ICD-10-CM

## 2022-04-12 DIAGNOSIS — Z13220 Encounter for screening for lipoid disorders: Secondary | ICD-10-CM | POA: Diagnosis not present

## 2022-04-12 DIAGNOSIS — F411 Generalized anxiety disorder: Secondary | ICD-10-CM

## 2022-04-12 DIAGNOSIS — E559 Vitamin D deficiency, unspecified: Secondary | ICD-10-CM

## 2022-04-12 MED ORDER — DULOXETINE HCL 60 MG PO CPEP: 60.0000 mg | ORAL_CAPSULE | Freq: Every day | ORAL | 5 refills | Status: DC

## 2022-04-12 MED ORDER — OMEPRAZOLE 40 MG PO CPDR: 40.0000 mg | DELAYED_RELEASE_CAPSULE | Freq: Every day | ORAL | 3 refills | Status: DC

## 2022-04-12 MED ORDER — BUSPIRONE HCL 10 MG PO TABS: ORAL_TABLET | ORAL | 4 refills | Status: DC

## 2022-04-12 MED ORDER — FAMOTIDINE 20 MG PO TABS: 20.0000 mg | ORAL_TABLET | Freq: Every evening | ORAL | 5 refills | Status: DC

## 2022-04-12 NOTE — Progress Notes (Signed)
   Subjective:    Patient ID: Brandi Dunn, female    DOB: 06/13/1980, 42 y.o.   MRN: [id number]  HPI Patient is her to discuss anxiety medication. Patient under a lot of stress Finds himself feeling anxious At times on edge worried.  Denies being depressed.  At times feels overwhelmed.  Was previously on Lexapro.  Currently on sertraline.  She does not feel that is helping we did discuss multiple medications including trying a different serotonin reuptake inhibitor and also a selective serotonin reuptake inhibitor We also discussed augmenting it with BuSpar and also with counseling  GERD related symptoms discussed what medicines can help she would like to resume these plus also minimize tomato-based products chocolates caffeine's denies any dysphagia or vomiting  Has history of hyperparathyroidism needs to have a follow-up lab work looking at her calcium and vitamin D  Lipid screening  Patient inquires about weight loss medication unlikely that GLP-1's are covered by her insurance based upon what I can find out that she will call her insurance company to get further details     Review of Systems     Objective:   Physical Exam General-in no acute distress Eyes-no discharge Lungs-respiratory rate normal, CTA CV-no murmurs,RRR Extremities skin warm dry no edema Neuro grossly normal Behavior normal, alert   25 minutes plus spent with patient as well as chart review and documentation greater than 30 minutes total     Assessment & Plan:  1. Hypocalcemia Patient with history of hypocalcemia.  She has had previous hyperparathyroidism surgery.  We need to check calcium to see if it is staying in a okay range  2. GAD (generalized anxiety disorder) Stress related issues partly related to how much she has going on between home, her parents, work.  Causing a significant mount of stress worry and concern.  She is already on medication but she does not feel that medicine is helping.   We discussed switching it she has already tried Lexapro in the past so therefore we will switch over to Cymbalta she will taper down on the sertraline then go to Cymbalta she will give [place] feedback electronically within 3 weeks and then we will do a follow-up visit within 6 to 8 weeks in addition to this will use BuSpar 1 taken twice daily as needed for anxiety  3. Gastroesophageal reflux disease without esophagitis Patient requests refills of her medicines and these were given  4. Screening, lipid Screening - Lipid panel - Comprehensive metabolic panel  5. Skin nodule Nodule near the corner of her eye patient states it burns and itches sometimes bleeds referral to dermatology may need surgery on it - Ambulatory referral to Dermatology  6. Vitamin D deficiency Check vitamin D level - Comprehensive metabolic panel - Vitamin D, 25-hydroxy  Morbid obesity-portion control regular physical activity unfortunately weight loss medicine is does not seem to be covered by her insurance  We did discuss possibility of counseling patient has already tried that once previously.  She is open to seeing a counselor we will try to help set that up  Follow-up 4 to 6 months

## 2022-04-18 NOTE — Addendum Note (Signed)
Addended by: Dairl Ponder on: 04/18/2022 09:21 AM   Modules accepted: Orders

## 2022-04-30 ENCOUNTER — Encounter: Payer: Self-pay | Admitting: Family Medicine

## 2022-05-01 NOTE — Telephone Encounter (Signed)
Nurses-she may try dicyclomine 10 mg tablet 1 taken 3 times daily as needed for IBS symptoms  Also think it is in her best interest for [place] to refer her to gastroenterology to get their input. In the meantime avoid excessive amount of fiber in the diet.  If she agrees lets go ahead with referral to gastroenterology to make sure that this is not inflammatory bowel disease or any other similar trouble thank you-Dr. Sallee Lange

## 2022-05-03 ENCOUNTER — Other Ambulatory Visit: Payer: Self-pay

## 2022-05-03 DIAGNOSIS — R197 Diarrhea, unspecified: Secondary | ICD-10-CM

## 2022-05-03 MED ORDER — DICYCLOMINE HCL 20 MG PO TABS: 20.0000 mg | ORAL_TABLET | Freq: Three times a day (TID) | ORAL | 0 refills | Status: DC | PRN

## 2022-05-03 NOTE — Telephone Encounter (Signed)
Spoke with patient and informed per drs notes and recommendations, she verbalized understanding

## 2022-05-04 ENCOUNTER — Encounter: Payer: Self-pay | Admitting: Gastroenterology

## 2022-05-22 NOTE — Progress Notes (Deleted)
Referring Provider: Babs Sciara, MD Primary Care Physician:  Babs Sciara, MD Primary Gastroenterologist:  Dr. Marletta Lor  No chief complaint on file.   HPI:   Brandi Dunn is a 42 y.o. female presenting today at the request of Luking, Jonna Coup, MD for frequent diarrhea.   Reviewed MyChart message 04/30/2022 with patient reporting urgent bowel movements after first meal of the day.  Dr. Gerda Diss recommended starting dicyclomine 10 mg 3 times daily as needed and refer to gastroenterology to rule out IBD/other.   Today:     History of pancreatic cyst.  MRI abdomen with and without contrast 04/21/2021 with stable 1.7 cm simple appearing cyst in the body of the pancreas.  Recommended continued followed up with MRI in 6 months. This has not been completed.   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   x2, [facility]   PARATHYROIDECTOMY Left 04/08/2020   Procedure: LEFT INFERIOR PARATHYROIDECTOMY;  Surgeon: Darnell Level, MD;  Location: [facility];  Service: General;  Laterality: Left;  LDOW ROOM 4  90 MIN   STONE EXTRACTION WITH BASKET Left 09/10/2012   Procedure: BALLOON DILATATION LEFT URETER; LEFT URETEROSCOPIC STONE EXTRACTION WITH BASKET;  Surgeon: Ky Barban, MD;  Location: [facility];  Service: Urology;  Laterality: Left;    Current Outpatient Medications  Medication Sig Dispense Refill   dicyclomine (BENTYL) 20 MG tablet Take 1 tablet (20 mg total)  by mouth 3 (three) times daily as needed (IBS symptoms). 90 tablet 0   busPIRone (BUSPAR) 10 MG tablet 1 bid prn 60 tablet 4   DULoxetine (CYMBALTA) 60 MG capsule Take 1 capsule (60 mg total) by mouth daily. 30 capsule 5   famotidine (PEPCID) 20 MG tablet Take 1 tablet (20 mg total) by mouth every evening. 30 tablet 5   fluticasone (FLONASE) 50 MCG/ACT nasal spray Place 2 sprays into both nostrils daily. (Patient not taking: Reported on 04/12/2022)     Insulin Pen Needle 31G X 5 MM MISC Use daily with saxenda 100 each 3   levonorgestrel (MIRENA) 20 MCG/24HR IUD 1 each by Intrauterine route once.     omeprazole (PRILOSEC) 40 MG capsule Take 1 capsule (40 mg total) by mouth daily 30 minutes before breakfast with water. 30 capsule 3   ondansetron (ZOFRAN) 4 MG tablet Take 1 tablet (4 mg total) by mouth every 6 (six) hours. 12 tablet 0   tamsulosin (FLOMAX) 0.4 MG CAPS capsule Take 1 capsule (0.4 mg total) by mouth daily after supper. 10 capsule 0   Vitamin D, Ergocalciferol, (DRISDOL) 1.25 MG (50000 UNIT) CAPS capsule TAKE 1 CAPSULE BY MOUTH EVERY 7 DAYS 4 capsule 0   No current facility-administered medications for this visit.    Allergies as of 05/24/2022   (No Known Allergies)    Family History  Problem Relation Age of Onset   Hypertension Father    Hypertension Mother  Hyperlipidemia Mother    Anesthesia problems Neg Hx    Hypotension Neg Hx    Malignant hyperthermia Neg Hx    Pseudochol deficiency Neg Hx     Social History   Socioeconomic History   Marital status: Single    Spouse name: Not on file   Number of children: Not on file   Years of education: Not on file   Highest education level: Not on file  Occupational History   Not on file  Tobacco Use   Smoking status: Never   Smokeless tobacco: Never  Vaping Use   Vaping Use: Never used  Substance and Sexual Activity   Alcohol use: No    Alcohol/week: 0.0 standard drinks of alcohol   Drug use: No   Sexual activity:  Yes    Birth control/protection: I.U.D.  Other Topics Concern   Not on file  Social History Narrative   Not on file   Social Determinants of Health   Financial Resource Strain: Low Risk  (06/23/2021)   Overall Financial Resource Strain (CARDIA)    Difficulty of Paying Living Expenses: Not hard at all  Food Insecurity: No Food Insecurity (06/23/2021)   Hunger Vital Sign    Worried About Running Out of Food in the Last Year: Never true    Ran Out of Food in the Last Year: Never true  Transportation Needs: No Transportation Needs (06/23/2021)   PRAPARE - Administrator, Civil Service (Medical): No    Lack of Transportation (Non-Medical): No  Physical Activity: Unknown (06/23/2021)   Exercise Vital Sign    Days of Exercise per Week: 3 days    Minutes of Exercise per Session: Not on file  Stress: Stress Concern Present (06/23/2021)   [employer] of Occupational Health - Occupational Stress Questionnaire    Feeling of Stress : To some extent  Social Connections: Socially Integrated (06/23/2021)   Social Connection and Isolation Panel [NHANES]    Frequency of Communication with Friends and Family: More than three times a week    Frequency of Social Gatherings with Friends and Family: Three times a week    Attends Religious Services: 1 to 4 times per year    Active Member of Clubs or Organizations: Yes    Attends Banker Meetings: More than 4 times per year    Marital Status: Married  Catering manager Violence: Not At Risk (06/23/2021)   Humiliation, Afraid, Rape, and Kick questionnaire    Fear of Current or Ex-Partner: No    Emotionally Abused: No    Physically Abused: No    Sexually Abused: No    Review of Systems: Gen: Denies any fever, chills, fatigue, weight loss, lack of appetite.  CV: Denies chest pain, heart palpitations, peripheral edema, syncope.  Resp: Denies shortness of breath at rest or with exertion. Denies wheezing or cough.  GI: Denies  dysphagia or odynophagia. Denies jaundice, hematemesis, fecal incontinence. GU : Denies urinary burning, urinary frequency, urinary hesitancy MS: Denies joint pain, muscle weakness, cramps, or limitation of movement.  Derm: Denies rash, itching, dry skin Psych: Denies depression, anxiety, memory loss, and confusion Heme: Denies bruising, bleeding, and enlarged lymph nodes.  Physical Exam: There were no vitals taken for this visit. General:   Alert and oriented. Pleasant and cooperative. Well-nourished and well-developed.  Head:  Normocephalic and atraumatic. Eyes:  Without icterus, sclera clear and conjunctiva pink.  Ears:  Normal auditory acuity. Lungs:  Clear to auscultation bilaterally. No wheezes, rales,  or rhonchi. No distress.  Heart:  S1, S2 present without murmurs appreciated.  Abdomen:  +BS, soft, non-tender and non-distended. No HSM noted. No guarding or rebound. No masses appreciated.  Rectal:  Deferred  Msk:  Symmetrical without gross deformities. Normal posture. Extremities:  Without edema. Neurologic:  Alert and  oriented x4;  grossly normal neurologically. Skin:  Intact without significant lesions or rashes. Psych:  Alert and cooperative. Normal mood and affect.    Assessment:     Plan:  ***   Ermalinda Memos, PA-C [facility] 05/24/2022

## 2022-05-23 DIAGNOSIS — L989 Disorder of the skin and subcutaneous tissue, unspecified: Secondary | ICD-10-CM | POA: Diagnosis not present

## 2022-05-23 DIAGNOSIS — D485 Neoplasm of uncertain behavior of skin: Secondary | ICD-10-CM | POA: Diagnosis not present

## 2022-05-24 ENCOUNTER — Ambulatory Visit: Payer: BC Managed Care – PPO | Admitting: Gastroenterology

## 2022-05-31 ENCOUNTER — Ambulatory Visit (INDEPENDENT_AMBULATORY_CARE_PROVIDER_SITE_OTHER): Payer: BC Managed Care – PPO | Admitting: Gastroenterology

## 2022-05-31 ENCOUNTER — Encounter: Payer: Self-pay | Admitting: Gastroenterology

## 2022-05-31 VITALS — BP 111/55 | HR 95 | Temp 97.1°F | Ht 64.0 in | Wt 259.3 lb

## 2022-05-31 DIAGNOSIS — K219 Gastro-esophageal reflux disease without esophagitis: Secondary | ICD-10-CM | POA: Diagnosis not present

## 2022-05-31 DIAGNOSIS — K529 Noninfective gastroenteritis and colitis, unspecified: Secondary | ICD-10-CM

## 2022-05-31 DIAGNOSIS — Z13220 Encounter for screening for lipoid disorders: Secondary | ICD-10-CM | POA: Diagnosis not present

## 2022-05-31 DIAGNOSIS — E559 Vitamin D deficiency, unspecified: Secondary | ICD-10-CM | POA: Diagnosis not present

## 2022-05-31 MED ORDER — PANTOPRAZOLE SODIUM 40 MG PO TBEC: 40.0000 mg | DELAYED_RELEASE_TABLET | Freq: Every day | ORAL | 3 refills | Status: DC

## 2022-05-31 NOTE — Patient Instructions (Signed)
Please have blood work done today.   I recommend taking 10 milligrams of dicyclomine (break tablet in half) before each meal up to three times a day. This can cause constipation, dry mouth, dizziness.   Let's stop omeprazole. Instead, start pantoprazole (Protonix) 30 minutes before breakfast daily. It can take up to 2 weeks to see full affect.   I would like to see you in 6 weeks. If no improvement with symptoms, we may need to do colonoscopy and/or endoscopy.  It was a pleasure to see you today. I want to create trusting relationships with patients and provide genuine, compassionate, and quality care. I truly value your feedback, so please be on the lookout for a survey regarding your visit with me today. I appreciate your time in completing this!    Gelene Mink, PhD, ANP-BC [facility]

## 2022-05-31 NOTE — Progress Notes (Signed)
Gastroenterology Office Note    Referring Provider: Babs Sciara, MD Primary Care Physician:  Babs Sciara, MD  Primary GI: Dr. Marletta Lor    Chief Complaint   Chief Complaint  Patient presents with   Diarrhea    Patient here today due to issues with diarrhea, Patient says she has to run to the rest room the first meal of the day. Patient says she is taking Bentyl 20 mg prn. Patient gerd is controlled with famotidine 20 mg daily, and omeprazole 40 mg daily.     History of Present Illness   Brandi Dunn is a 42 y.o. female presenting today at the request of Luking, Jonna Coup, MD due to chronic diarrhea.    First meal of day will cause postprandial urgency. Will have hot sweats. Will have to stop if driving on side of road. Abdominal cramping. Occurs daily. Present for over a year. BM normally watery. 2-3 stools a day. No rectal bleeding. No weight loss. No lack of appetite. Unable to pinpoint triggers. No abdominal bloating. Was recently prescribed dicyclomine to take as needed but feels too late to take prn.   Omeprazole 40 mg daily along with Pepcid 20 mg daily. Chronically on omeprazole. Will have nocturnal burning. Chronic GERD. Sometimes solid food dysphagia. No late night eating. Stays away from trigger foods for GERD like lasagna, pizza, sauces, spicy.    No FH colon cancer or polyps. No FH IBD.    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   x2, [facility]   PARATHYROIDECTOMY Left 04/08/2020   Procedure: LEFT INFERIOR PARATHYROIDECTOMY;  Surgeon: Darnell Level, MD;  Location: [facility];  Service: General;  Laterality: Left;  LDOW ROOM 4  90 MIN   STONE EXTRACTION WITH BASKET Left 09/10/2012   Procedure: BALLOON DILATATION LEFT URETER; LEFT URETEROSCOPIC STONE EXTRACTION WITH BASKET;  Surgeon: Ky Barban, MD;  Location: [facility];  Service: Urology;  Laterality: Left;    Current Outpatient Medications  Medication Sig Dispense Refill   busPIRone (BUSPAR) 10 MG tablet 1 bid prn 60 tablet 4   dicyclomine (BENTYL) 20 MG tablet Take 1 tablet (20 mg total) by mouth 3 (three) times daily as needed (IBS symptoms). 90 tablet 0   DULoxetine (CYMBALTA) 60 MG capsule Take 1 capsule (60 mg total) by mouth daily. 30 capsule 5   famotidine (PEPCID) 20 MG tablet Take 1 tablet (20 mg total) by mouth every evening. 30 tablet 5   fluticasone (FLONASE) 50 MCG/ACT nasal spray Place 2 sprays into both nostrils daily.     Insulin Pen Needle 31G X 5 MM MISC Use daily with saxenda 100 each 3   levonorgestrel (MIRENA) 20 MCG/24HR IUD 1 each by Intrauterine route once.     omeprazole (PRILOSEC) 40 MG capsule Take 1 capsule (40 mg total) by mouth daily 30 minutes before breakfast with water. 30  capsule 3   pantoprazole (PROTONIX) 40 MG tablet Take 1 tablet (40 mg total) by mouth daily. Take 30 minutes before breakfast 90 tablet 3   No current facility-administered medications for this visit.    Allergies as of 05/31/2022   (No Known Allergies)    Family History  Problem Relation Age of Onset   Hypertension Mother    Hyperlipidemia Mother    Hypertension Father    Anesthesia problems Neg Hx    Hypotension Neg Hx    Malignant hyperthermia Neg Hx    Pseudochol deficiency Neg Hx    Colon cancer Neg Hx    Colon polyps Neg Hx    Inflammatory bowel disease Neg Hx     Social History   Socioeconomic History   Marital status: Single     Spouse name: Not on file   Number of children: Not on file   Years of education: Not on file   Highest education level: Not on file  Occupational History   Occupation: [employer]  Tobacco Use   Smoking status: Never   Smokeless tobacco: Never  Vaping Use   Vaping Use: Never used  Substance and Sexual Activity   Alcohol use: No    Alcohol/week: 0.0 standard drinks of alcohol   Drug use: No   Sexual activity: Yes    Birth control/protection: I.U.D.  Other Topics Concern   Not on file  Social History Narrative   Not on file   Social Determinants of Health   Financial Resource Strain: Low Risk  (06/23/2021)   Overall Financial Resource Strain (CARDIA)    Difficulty of Paying Living Expenses: Not hard at all  Food Insecurity: No Food Insecurity (06/23/2021)   Hunger Vital Sign    Worried About Running Out of Food in the Last Year: Never true    Ran Out of Food in the Last Year: Never true  Transportation Needs: No Transportation Needs (06/23/2021)   PRAPARE - Administrator, Civil Service (Medical): No    Lack of Transportation (Non-Medical): No  Physical Activity: Unknown (06/23/2021)   Exercise Vital Sign    Days of Exercise per Week: 3 days    Minutes of Exercise per Session: Not on file  Stress: Stress Concern Present (06/23/2021)   [employer] of Occupational Health - Occupational Stress Questionnaire    Feeling of Stress : To some extent  Social Connections: Socially Integrated (06/23/2021)   Social Connection and Isolation Panel [NHANES]    Frequency of Communication with Friends and Family: More than three times a week    Frequency of Social Gatherings with Friends and Family: Three times a week    Attends Religious Services: 1 to 4 times per year    Active Member of Clubs or Organizations: Yes    Attends Banker Meetings: More than 4 times per year    Marital Status: Married  Catering manager Violence: Not At Risk (06/23/2021)    Humiliation, Afraid, Rape, and Kick questionnaire    Fear of Current or Ex-Partner: No    Emotionally Abused: No    Physically Abused: No    Sexually Abused: No     Review of Systems   Gen: Denies any fever, chills, fatigue, weight loss, lack of appetite.  CV: Denies chest pain, heart palpitations, peripheral edema, syncope.  Resp: Denies shortness of breath at rest or with exertion. Denies wheezing or cough.  GI: Denies dysphagia or odynophagia. Denies jaundice, hematemesis, fecal incontinence. GU :  Denies urinary burning, urinary frequency, urinary hesitancy MS: Denies joint pain, muscle weakness, cramps, or limitation of movement.  Derm: Denies rash, itching, dry skin Psych: Denies depression, anxiety, memory loss, and confusion Heme: Denies bruising, bleeding, and enlarged lymph nodes.   Physical Exam   BP (!) 111/55 (BP Location: Left Arm, Patient Position: Sitting, Cuff Size: Large)   Pulse 95   Temp (!) 97.1 F (36.2 C) (Temporal)   Ht  (1.626 m)   Wt 259 lb 4.8 oz (117.6 kg)   BMI 44.51 kg/m  General:   Alert and oriented. Pleasant and cooperative. Well-nourished and well-developed.  Head:  Normocephalic and atraumatic. Eyes:  Without icterus Ears:  Normal auditory acuity. Lungs:  Clear to auscultation bilaterally.  Heart:  S1, S2 present without murmurs appreciated.  Abdomen:  +BS, soft, non-tender and non-distended. No HSM noted. No guarding or rebound. No masses appreciated.  Rectal:  Deferred  Msk:  Symmetrical without gross deformities. Normal posture. Extremities:  Without edema. Neurologic:  Alert and  oriented x4;  grossly normal neurologically. Skin:  Intact without significant lesions or rashes. Psych:  Alert and cooperative. Normal mood and affect.   Assessment   LITHA LAMARTINA is a 42 y.o. female presenting today with 42 y.o. female presenting today at the request of Luking, Jonna Coup, MD due to chronic diarrhea.   Diarrhea: likely related to  IBS-D. However, we will check routine labs including celiac serologies. As no alarm features, will trial dicyclomine 10 mg scheduled before meals instead of prn. If no improvement, would favor colonoscopy.   GERD: chronically on omeprazole. Not ideally controlled. Vague solid food dysphagia. Very well may need EGD/dilation in near future. Change to pantoprazole daily. She is to update with progress report. Continue diet/behavior modifications.      PLAN   Dicyclomine 10 mg before meals TID  Stop omeprazole, start pantoprazole once daily. Pepcid prn. Diet/behavior modifications  Message if no improvement and will go ahead and arrange colonoscopy +/- EGD/dilation  6 week close follow-up  Labs today including celiac serologies  Gelene Mink, PhD, ANP-BC [facility]

## 2022-06-01 ENCOUNTER — Encounter: Payer: Self-pay | Admitting: Gastroenterology

## 2022-06-01 LAB — COMPREHENSIVE METABOLIC PANEL
ALT: 15 IU/L (ref 0–32)
AST: 14 IU/L (ref 0–40)
Albumin/Globulin Ratio: 1.5 (ref 1.2–2.2)
Albumin: 4.3 g/dL (ref 3.9–4.9)
Alkaline Phosphatase: 55 IU/L (ref 44–121)
BUN/Creatinine Ratio: 8 — ABNORMAL LOW (ref 9–23)
BUN: 7 mg/dL (ref 6–24)
Bilirubin Total: 0.5 mg/dL (ref 0.0–1.2)
CO2: 25 mmol/L (ref 20–29)
Calcium: 9.4 mg/dL (ref 8.7–10.2)
Chloride: 100 mmol/L (ref 96–106)
Creatinine, Ser: 0.9 mg/dL (ref 0.57–1.00)
Globulin, Total: 2.8 g/dL (ref 1.5–4.5)
Glucose: 95 mg/dL (ref 70–99)
Potassium: 4.3 mmol/L (ref 3.5–5.2)
Sodium: 140 mmol/L (ref 134–144)
Total Protein: 7.1 g/dL (ref 6.0–8.5)
eGFR: 82 mL/min/{1.73_m2} (ref >59)

## 2022-06-01 LAB — LIPID PANEL
Chol/HDL Ratio: 3.3 ratio (ref 0.0–4.4)
Cholesterol, Total: 190 mg/dL (ref 100–199)
HDL: 58 mg/dL (ref >39)
LDL Chol Calc (NIH): 113 mg/dL — ABNORMAL HIGH (ref 0–99)
Triglycerides: 109 mg/dL (ref 0–149)
VLDL Cholesterol Cal: 19 mg/dL (ref 5–40)

## 2022-06-01 LAB — VITAMIN D 25 HYDROXY (VIT D DEFICIENCY, FRACTURES): Vit D, 25-Hydroxy: 10.7 ng/mL — ABNORMAL LOW (ref 30.0–100.0)

## 2022-06-02 ENCOUNTER — Encounter: Payer: Self-pay | Admitting: Family Medicine

## 2022-06-03 LAB — TSH: TSH: 1.8 u[IU]/mL (ref 0.450–4.500)

## 2022-06-03 LAB — CBC WITH DIFFERENTIAL/PLATELET
Basophils Absolute: 0 10*3/uL (ref 0.0–0.2)
Basos: 1 %
EOS (ABSOLUTE): 0.1 10*3/uL (ref 0.0–0.4)
Eos: 1 %
Hematocrit: 43.2 % (ref 34.0–46.6)
Hemoglobin: 13.7 g/dL (ref 11.1–15.9)
Immature Grans (Abs): 0 10*3/uL (ref 0.0–0.1)
Immature Granulocytes: 0 %
Lymphocytes Absolute: 2.4 10*3/uL (ref 0.7–3.1)
Lymphs: 39 %
MCH: 28 pg (ref 26.6–33.0)
MCHC: 31.7 g/dL (ref 31.5–35.7)
MCV: 88 fL (ref 79–97)
Monocytes Absolute: 0.3 10*3/uL (ref 0.1–0.9)
Monocytes: 5 %
Neutrophils Absolute: 3.2 10*3/uL (ref 1.4–7.0)
Neutrophils: 54 %
Platelets: 255 10*3/uL (ref 150–450)
RBC: 4.9 x10E6/uL (ref 3.77–5.28)
RDW: 13.3 % (ref 11.7–15.4)
WBC: 6 10*3/uL (ref 3.4–10.8)

## 2022-06-03 LAB — TISSUE TRANSGLUTAMINASE, IGA: Transglutaminase IgA: <2 U/mL (ref 0–3)

## 2022-06-03 LAB — IGA: IgA/Immunoglobulin A, Serum: 190 mg/dL (ref 87–352)

## 2022-06-05 ENCOUNTER — Other Ambulatory Visit: Payer: Self-pay

## 2022-06-05 DIAGNOSIS — R7989 Other specified abnormal findings of blood chemistry: Secondary | ICD-10-CM

## 2022-06-05 MED ORDER — VITAMIN D (ERGOCALCIFEROL) 1.25 MG (50000 UNIT) PO CAPS: 50000.0000 [IU] | ORAL_CAPSULE | ORAL | 2 refills | Status: DC

## 2022-06-19 ENCOUNTER — Encounter: Payer: Self-pay | Admitting: Family Medicine

## 2022-06-20 ENCOUNTER — Telehealth: Payer: Self-pay | Admitting: Gastroenterology

## 2022-06-20 NOTE — Telephone Encounter (Signed)
Nurses-given her documentation-it is reasonable to start Wegovy 0.25 mg once weekly she has taken it before 1 month supply with 1 refill as she gets used to the starting dose she can message [place] and we will send then the next dose  There are manufacturing shortages going on which may make getting the supply difficult  We do recommend a follow-up visit in approximately 3 months  It is important for her to keep up with weighing herself either weekly or every 2 weeks for documentation purposes  If any problems to notify us  It would be fine to forward a copy of this message to the patient as well  Thanks-Dr. Lorin Picket

## 2022-06-20 NOTE — Telephone Encounter (Signed)
Please arrange colonoscopy, endoscopy/dilation with Dr. Marletta Lor due to diarrhea, GERD, and dysphagia.   ASA 3. She may be starting Wegovy. Needs to hold this one week prior.

## 2022-06-21 MED ORDER — PEG 3350-KCL-NA BICARB-NACL 420 G PO SOLR: 4000.0000 mL | Freq: Once | ORAL | 0 refills | Status: AC

## 2022-06-21 NOTE — Addendum Note (Signed)
Addended by: Armstead Peaks on: 06/21/2022 11:21 AM   Modules accepted: Orders

## 2022-06-21 NOTE — Telephone Encounter (Signed)
Spoke with pt. Scheduled with Dr. Marletta Lor 5/29. Aware will send instructions via mychart with pre-op appt. She has not started wegovy. Advised if she did start prior it will have to be held x 7 days or it may be best to wait until after procedure to start. Advised will send rx to pharmacy for prep. She voiced understanding.

## 2022-06-23 ENCOUNTER — Encounter: Payer: Self-pay | Admitting: *Deleted

## 2022-06-26 ENCOUNTER — Other Ambulatory Visit: Payer: Self-pay | Admitting: Family Medicine

## 2022-06-26 MED ORDER — WEGOVY 0.25 MG/0.5ML ~~LOC~~ SOAJ: SUBCUTANEOUS | 2 refills | Status: DC

## 2022-06-26 NOTE — Telephone Encounter (Signed)
Dr Lorin Picket Pt is calling back for a follow up

## 2022-06-29 NOTE — Patient Instructions (Signed)
Brandi Dunn  06/29/2022     @PREFPERIOPPHARMACY @   Your procedure is scheduled on  07/05/2022.   Report to [facility] at  0600  A.M.   Call this number if you have problems the morning of surgery:  [phone]  If you experience any cold or flu symptoms such as cough, fever, chills, shortness of breath, etc. between now and your scheduled surgery, please notify us at the above number.   Remember:  Follow the diet and prep instructions given to you by the office.      Your last dose of wegovy should have been on 5/21.     Take these medicines the morning of surgery with A SIP OF WATER                   buspar, cymbalta, pantoprazole.     Do not wear jewelry, make-up or nail polish, including gel polish,  artificial nails, or any other type of covering on natural nails (fingers and  toes).   Do not wear lotions, powders, or perfumes, or deodorant.  Do not shave 48 hours prior to surgery.  Men may shave face and neck.  Do not bring valuables to the hospital.  [facility] is not responsible for any belongings or valuables.  Contacts, dentures or bridgework may not be worn into surgery.  Leave your suitcase in the car.  After surgery it may be brought to your room.  For patients admitted to the hospital, discharge time will be determined by your treatment team.  Patients discharged the day of surgery will not be allowed to drive home and must have someone with them for 24 hours.    Special instructions:   DO NOT smoke tobacco or vape for 24 hours before your procedure.  Please read over the following fact sheets that you were given. Anesthesia Post-op Instructions and Care and Recovery After Surgery      Upper Endoscopy, Adult, Care After After the procedure, it is common to have a sore throat. It is also common to have: Mild stomach pain or discomfort. Bloating. Nausea. Follow these instructions at home: The instructions below may help you  care for yourself at home. Your health care provider may give you more instructions. If you have questions, ask your health care provider. If you were given a sedative during the procedure, it can affect you for several hours. Do not drive or operate machinery until your health care provider says that it is safe. If you will be going home right after the procedure, plan to have a responsible adult: Take you home from the hospital or clinic. You will not be allowed to drive. Care for you for the time you are told. Follow instructions from your health care provider about what you may eat and drink. Return to your normal activities as told by your health care provider. Ask your health care provider what activities are safe for you. Take over-the-counter and prescription medicines only as told by your health care provider. Contact a health care provider if you: Have a sore throat that lasts longer than one day. Have trouble swallowing. Have a fever. Get help right away if you: Vomit blood or your vomit looks like coffee grounds. Have bloody, black, or tarry stools. Have a very bad sore throat or you cannot swallow. Have difficulty breathing or very bad pain in your chest or abdomen. These symptoms may be an  emergency. Get help right away. Call 911. Do not wait to see if the symptoms will go away. Do not drive yourself to the hospital. Summary After the procedure, it is common to have a sore throat, mild stomach discomfort, bloating, and nausea. If you were given a sedative during the procedure, it can affect you for several hours. Do not drive until your health care provider says that it is safe. Follow instructions from your health care provider about what you may eat and drink. Return to your normal activities as told by your health care provider. This information is not intended to replace advice given to you by your health care provider. Make sure you discuss any questions you have with your  health care provider. Document Revised: 05/04/2021 Document Reviewed: 05/04/2021 Elsevier Patient Education  2023 [employer] Esophageal Dilatation Esophageal dilatation, also called esophageal dilation, is a procedure to widen or open a blocked or narrowed part of the esophagus. The esophagus is the part of the body that moves food and liquid from the mouth to the stomach. You may need this procedure if: You have a buildup of scar tissue in your esophagus that makes it difficult, painful, or impossible to swallow. This can be caused by gastroesophageal reflux disease (GERD). You have cancer of the esophagus. There is a problem with how food moves through your esophagus. In some cases, you may need this procedure repeated at a later time to dilate the esophagus gradually. Tell a health care provider about: Any allergies you have. All medicines you are taking, including vitamins, herbs, eye drops, creams, and over-the-counter medicines. Any problems you or family members have had with anesthetic medicines. Any blood disorders you have. Any surgeries you have had. Any medical conditions you have. Any antibiotic medicines you are required to take before dental procedures. Whether you are pregnant or may be pregnant. What are the risks? Generally, this is a safe procedure. However, problems may occur, including: Bleeding due to a tear in the lining of the esophagus. A hole, or perforation, in the esophagus. What happens before the procedure? Ask your health care provider about: Changing or stopping your regular medicines. This is especially important if you are taking diabetes medicines or blood thinners. Taking medicines such as aspirin and ibuprofen. These medicines can thin your blood. Do not take these medicines unless your health care provider tells you to take them. Taking over-the-counter medicines, vitamins, herbs, and supplements. Follow instructions from your health care provider  about eating or drinking restrictions. Plan to have a responsible adult take you home from the hospital or clinic. Plan to have a responsible adult care for you for the time you are told after you leave the hospital or clinic. This is important. What happens during the procedure? You may be given a medicine to help you relax (sedative). A numbing medicine may be sprayed into the back of your throat, or you may gargle the medicine. Your health care provider may perform the dilatation using various surgical instruments, such as: Simple dilators. This instrument is carefully placed in the esophagus to stretch it. Guided wire bougies. This involves using an endoscope to insert a wire into the esophagus. A dilator is passed over this wire to enlarge the esophagus. Then the wire is removed. Balloon dilators. An endoscope with a small balloon is inserted into the esophagus. The balloon is inflated to stretch the esophagus and open it up. The procedure may vary among health care providers and hospitals. What can  I expect after the procedure? Your blood pressure, heart rate, breathing rate, and blood oxygen level will be monitored until you leave the hospital or clinic. Your throat may feel slightly sore and numb. This will get better over time. You will not be allowed to eat or drink until your throat is no longer numb. When you are able to drink, urinate, and sit on the edge of the bed without nausea or dizziness, you may be able to return home. Follow these instructions at home: Take over-the-counter and prescription medicines only as told by your health care provider. If you were given a sedative during the procedure, it can affect you for several hours. Do not drive or operate machinery until your health care provider says that it is safe. Plan to have a responsible adult care for you for the time you are told. This is important. Follow instructions from your health care provider about any eating or  drinking restrictions. Do not use any products that contain nicotine or tobacco, such as cigarettes, e-cigarettes, and chewing tobacco. If you need help quitting, ask your health care provider. Keep all follow-up visits. This is important. Contact a health care provider if: You have a fever. You have pain that is not relieved by medicine. Get help right away if: You have chest pain. You have trouble breathing. You have trouble swallowing. You vomit blood. You have black, tarry, or bloody stools. These symptoms may represent a serious problem that is an emergency. Do not wait to see if the symptoms will go away. Get medical help right away. Call your local emergency services (911 in the [place] Do not drive yourself to the hospital. Summary Esophageal dilatation, also called esophageal dilation, is a procedure to widen or open a blocked or narrowed part of the esophagus. Plan to have a responsible adult take you home from the hospital or clinic. For this procedure, a numbing medicine may be sprayed into the back of your throat, or you may gargle the medicine. Do not drive or operate machinery until your health care provider says that it is safe. This information is not intended to replace advice given to you by your health care provider. Make sure you discuss any questions you have with your health care provider. Document Revised: 06/11/2019 Document Reviewed: 06/11/2019 Elsevier Patient Education  2023 [employer] Colonoscopy, Adult, Care After The following information offers guidance on how to care for yourself after your procedure. Your health care provider may also give you more specific instructions. If you have problems or questions, contact your health care provider. What can I expect after the procedure? After the procedure, it is common to have: A small amount of blood in your stool for 24 hours after the procedure. Some gas. Mild cramping or bloating of your abdomen. Follow  these instructions at home: Eating and drinking  Drink enough fluid to keep your urine pale yellow. Follow instructions from your health care provider about eating or drinking restrictions. Resume your normal diet as told by your health care provider. Avoid heavy or fried foods that are hard to digest. Activity Rest as told by your health care provider. Avoid sitting for a long time without moving. Get up to take short walks every 1-2 hours. This is important to improve blood flow and breathing. Ask for help if you feel weak or unsteady. Return to your normal activities as told by your health care provider. Ask your health care provider what activities are safe for you. Managing  cramping and bloating  Try walking around when you have cramps or feel bloated. If directed, apply heat to your abdomen as told by your health care provider. Use the heat source that your health care provider recommends, such as a moist heat pack or a heating pad. Place a towel between your skin and the heat source. Leave the heat on for 20-30 minutes. Remove the heat if your skin turns bright red. This is especially important if you are unable to feel pain, heat, or cold. You have a greater risk of getting burned. General instructions If you were given a sedative during the procedure, it can affect you for several hours. Do not drive or operate machinery until your health care provider says that it is safe. For the first 24 hours after the procedure: Do not sign important documents. Do not drink alcohol. Do your regular daily activities at a slower pace than normal. Eat soft foods that are easy to digest. Take over-the-counter and prescription medicines only as told by your health care provider. Keep all follow-up visits. This is important. Contact a health care provider if: You have blood in your stool 2-3 days after the procedure. Get help right away if: You have more than a small spotting of blood in your  stool. You have large blood clots in your stool. You have swelling of your abdomen. You have nausea or vomiting. You have a fever. You have increasing pain in your abdomen that is not relieved with medicine. These symptoms may be an emergency. Get help right away. Call 911. Do not wait to see if the symptoms will go away. Do not drive yourself to the hospital. Summary After the procedure, it is common to have a small amount of blood in your stool. You may also have mild cramping and bloating of your abdomen. If you were given a sedative during the procedure, it can affect you for several hours. Do not drive or operate machinery until your health care provider says that it is safe. Get help right away if you have a lot of blood in your stool, nausea or vomiting, a fever, or increased pain in your abdomen. This information is not intended to replace advice given to you by your health care provider. Make sure you discuss any questions you have with your health care provider. Document Revised: 09/15/2020 Document Reviewed: 09/15/2020 Elsevier Patient Education  2023 [employer] Monitored Anesthesia Care, Care After The following information offers guidance on how to care for yourself after your procedure. Your health care provider may also give you more specific instructions. If you have problems or questions, contact your health care provider. What can I expect after the procedure? After the procedure, it is common to have: Tiredness. Little or no memory about what happened during or after the procedure. Impaired judgment when it comes to making decisions. Nausea or vomiting. Some trouble with balance. Follow these instructions at home: For the time period you were told by your health care provider:  Rest. Do not participate in activities where you could fall or become injured. Do not drive or use machinery. Do not drink alcohol. Do not take sleeping pills or medicines that cause  drowsiness. Do not make important decisions or sign legal documents. Do not take care of children on your own. Medicines Take over-the-counter and prescription medicines only as told by your health care provider. If you were prescribed antibiotics, take them as told by your health care provider. Do not stop using  the antibiotic even if you start to feel better. Eating and drinking Follow instructions from your health care provider about what you may eat and drink. Drink enough fluid to keep your urine pale yellow. If you vomit: Drink clear fluids slowly and in small amounts as you are able. Clear fluids include water, ice chips, low-calorie sports drinks, and fruit juice that has water added to it (diluted fruit juice). Eat light and bland foods in small amounts as you are able. These foods include bananas, applesauce, rice, lean meats, toast, and crackers. General instructions  Have a responsible adult stay with you for the time you are told. It is important to have someone help care for you until you are awake and alert. If you have sleep apnea, surgery and some medicines can increase your risk for breathing problems. Follow instructions from your health care provider about wearing your sleep device: When you are sleeping. This includes during daytime naps. While taking prescription pain medicines, sleeping medicines, or medicines that make you drowsy. Do not use any products that contain nicotine or tobacco. These products include cigarettes, chewing tobacco, and vaping devices, such as e-cigarettes. If you need help quitting, ask your health care provider. Contact a health care provider if: You feel nauseous or vomit every time you eat or drink. You feel light-headed. You are still sleepy or having trouble with balance after 24 hours. You get a rash. You have a fever. You have redness or swelling around the IV site. Get help right away if: You have trouble breathing. You have new  confusion after you get home. These symptoms may be an emergency. Get help right away. Call 911. Do not wait to see if the symptoms will go away. Do not drive yourself to the hospital. This information is not intended to replace advice given to you by your health care provider. Make sure you discuss any questions you have with your health care provider. Document Revised: 06/20/2021 Document Reviewed: 06/20/2021 Elsevier Patient Education  2023 [employer].

## 2022-06-30 ENCOUNTER — Encounter (HOSPITAL_COMMUNITY): Payer: BC Managed Care – PPO

## 2022-06-30 ENCOUNTER — Encounter (HOSPITAL_COMMUNITY): Payer: Self-pay

## 2022-06-30 ENCOUNTER — Encounter (HOSPITAL_COMMUNITY)
Admission: RE | Admit: 2022-06-30 | Discharge: 2022-06-30 | Disposition: A | Discharge status: Home / Self Care | Payer: BC Managed Care – PPO | Source: Ambulatory Visit | Attending: Internal Medicine | Admitting: Internal Medicine

## 2022-06-30 VITALS — BP 105/53 | HR 92 | Temp 97.6°F | Resp 18 | Ht 64.0 in | Wt 259.3 lb

## 2022-06-30 DIAGNOSIS — Z01818 Encounter for other preprocedural examination: Secondary | ICD-10-CM | POA: Insufficient documentation

## 2022-06-30 HISTORY — DX: Anxiety disorder, unspecified: F41.9

## 2022-06-30 LAB — POCT PREGNANCY, URINE: Preg Test, Ur: NEGATIVE

## 2022-07-05 ENCOUNTER — Ambulatory Visit (HOSPITAL_COMMUNITY)
Admission: RE | Admit: 2022-07-05 | Discharge: 2022-07-05 | Disposition: A | Discharge status: Home / Self Care | Payer: BC Managed Care – PPO | Source: Ambulatory Visit | Attending: Internal Medicine | Admitting: Internal Medicine

## 2022-07-05 ENCOUNTER — Encounter (HOSPITAL_COMMUNITY): Payer: Self-pay

## 2022-07-05 ENCOUNTER — Ambulatory Visit (HOSPITAL_COMMUNITY): Payer: BC Managed Care – PPO | Admitting: Certified Registered"

## 2022-07-05 ENCOUNTER — Encounter (HOSPITAL_COMMUNITY)
Admission: RE | Disposition: A | Discharge status: Home / Self Care | Payer: Self-pay | Source: Ambulatory Visit | Attending: Internal Medicine

## 2022-07-05 DIAGNOSIS — K529 Noninfective gastroenteritis and colitis, unspecified: Secondary | ICD-10-CM

## 2022-07-05 DIAGNOSIS — Z6841 Body Mass Index (BMI) 40.0 and over, adult: Secondary | ICD-10-CM | POA: Insufficient documentation

## 2022-07-05 DIAGNOSIS — K449 Diaphragmatic hernia without obstruction or gangrene: Secondary | ICD-10-CM | POA: Diagnosis not present

## 2022-07-05 DIAGNOSIS — B9681 Helicobacter pylori [H. pylori] as the cause of diseases classified elsewhere: Secondary | ICD-10-CM | POA: Diagnosis not present

## 2022-07-05 DIAGNOSIS — R197 Diarrhea, unspecified: Secondary | ICD-10-CM | POA: Diagnosis not present

## 2022-07-05 DIAGNOSIS — K648 Other hemorrhoids: Secondary | ICD-10-CM | POA: Diagnosis not present

## 2022-07-05 DIAGNOSIS — K2970 Gastritis, unspecified, without bleeding: Secondary | ICD-10-CM | POA: Diagnosis not present

## 2022-07-05 DIAGNOSIS — K649 Unspecified hemorrhoids: Secondary | ICD-10-CM | POA: Diagnosis not present

## 2022-07-05 DIAGNOSIS — R1310 Dysphagia, unspecified: Secondary | ICD-10-CM | POA: Diagnosis not present

## 2022-07-05 DIAGNOSIS — K2960 Other gastritis without bleeding: Secondary | ICD-10-CM | POA: Diagnosis not present

## 2022-07-05 DIAGNOSIS — K219 Gastro-esophageal reflux disease without esophagitis: Secondary | ICD-10-CM | POA: Diagnosis not present

## 2022-07-05 HISTORY — PX: BIOPSY: SHX5522

## 2022-07-05 HISTORY — PX: COLONOSCOPY WITH PROPOFOL: SHX5780

## 2022-07-05 HISTORY — PX: ESOPHAGOGASTRODUODENOSCOPY (EGD) WITH PROPOFOL: SHX5813

## 2022-07-05 SURGERY — COLONOSCOPY WITH PROPOFOL
Anesthesia: General

## 2022-07-05 MED ORDER — LIDOCAINE 2% (20 MG/ML) 5 ML SYRINGE
INTRAMUSCULAR | Status: DC | PRN
  Administered 2022-07-05: 100 mg via INTRAVENOUS

## 2022-07-05 MED ORDER — PROPOFOL 10 MG/ML IV BOLUS
INTRAVENOUS | Status: AC
  Filled 2022-07-05: qty 20

## 2022-07-05 MED ORDER — PROPOFOL 500 MG/50ML IV EMUL
INTRAVENOUS | Status: AC
  Filled 2022-07-05: qty 50

## 2022-07-05 MED ORDER — STERILE WATER FOR IRRIGATION IR SOLN
Status: DC | PRN
  Administered 2022-07-05: 100 mL

## 2022-07-05 MED ORDER — PANTOPRAZOLE SODIUM 40 MG PO TBEC: 40.0000 mg | DELAYED_RELEASE_TABLET | Freq: Two times a day (BID) | ORAL | 11 refills | Status: DC

## 2022-07-05 MED ORDER — LACTATED RINGERS IV SOLN: INTRAVENOUS | Status: DC

## 2022-07-05 MED ORDER — LIDOCAINE HCL (PF) 2 % IJ SOLN
INTRAMUSCULAR | Status: AC
  Filled 2022-07-05: qty 5

## 2022-07-05 MED ORDER — PROPOFOL 10 MG/ML IV BOLUS
INTRAVENOUS | Status: DC | PRN
  Administered 2022-07-05: 100 mg via INTRAVENOUS
  Administered 2022-07-05: 200 ug/kg/min via INTRAVENOUS

## 2022-07-05 NOTE — Transfer of Care (Signed)
Immediate Anesthesia Transfer of Care Note  Patient: Brandi Dunn  Procedure(s) Performed: COLONOSCOPY WITH PROPOFOL ESOPHAGOGASTRODUODENOSCOPY (EGD) WITH PROPOFOL BIOPSY  Patient Location: PACU  Anesthesia Type:General  Level of Consciousness: drowsy  Airway & Oxygen Therapy: Patient Spontanous Breathing and Patient connected to face mask oxygen  Post-op Assessment: Report given to RN and Post -op Vital signs reviewed and stable  Post vital signs: Reviewed and stable  Last Vitals:  Vitals Value Taken Time  BP 107/68 07/05/22 0804  Temp 36.5 C 07/05/22 0804  Pulse 85 07/05/22 0804  Resp 15 07/05/22 0804  SpO2 100 % 07/05/22 0804    Last Pain:  Vitals:   07/05/22 0807  TempSrc:   PainSc: 0-No pain      Patients Stated Pain Goal: 2 (07/05/22 0710)  Complications: No notable events documented.

## 2022-07-05 NOTE — H&P (Signed)
Primary Care Physician:  Babs Sciara, MD Primary Gastroenterologist:  Dr. Marletta Lor  Pre-Procedure History & Physical: HPI:  Brandi Dunn is a 42 y.o. 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   x2, [facility]   PARATHYROIDECTOMY Left 04/08/2020   Procedure: LEFT INFERIOR PARATHYROIDECTOMY;  Surgeon: Darnell Level, MD;  Location: [facility];  Service: General;  Laterality: Left;  LDOW ROOM 4  90 MIN   STONE EXTRACTION WITH BASKET Left 09/10/2012   Procedure: BALLOON DILATATION LEFT URETER; LEFT URETEROSCOPIC STONE EXTRACTION WITH BASKET;  Surgeon: Ky Barban, MD;  Location: [facility];  Service: Urology;  Laterality: Left;    Prior to Admission medications   Medication Sig Start Date End Date Taking? Authorizing Provider  aspirin-acetaminophen-caffeine (EXCEDRIN MIGRAINE) [phone] MG tablet Take 2 tablets by mouth 2 (two) times daily as needed for headache or migraine.   Yes [provider]  busPIRone (BUSPAR) 10 MG tablet 1 bid prn 04/12/22  Yes Luking, Jonna Coup, MD  dicyclomine (BENTYL) 20 MG tablet Take 1 tablet (20 mg total) by mouth 3 (three) times daily as needed (IBS symptoms). Patient taking differently: Take 10 mg by mouth in the morning, at noon, in the evening, and at bedtime. 05/03/22  Yes Luking, Jonna Coup, MD  DULoxetine  (CYMBALTA) 60 MG capsule Take 1 capsule (60 mg total) by mouth daily. 04/12/22  Yes Babs Sciara, MD  famotidine (PEPCID) 20 MG tablet Take 1 tablet (20 mg total) by mouth every evening. 04/12/22  Yes Luking, Jonna Coup, MD  fluticasone (FLONASE) 50 MCG/ACT nasal spray Place 2 sprays into both nostrils daily as needed for allergies. 09/21/20  Yes [provider]  levonorgestrel (MIRENA) 20 MCG/24HR IUD 1 each by Intrauterine route once.   Yes [provider]  pantoprazole (PROTONIX) 40 MG tablet Take 1 tablet (40 mg total) by mouth daily. Take 30 minutes before breakfast 05/31/22  Yes Gelene Mink, NP  Semaglutide-Weight Management [facility]) 0.25 MG/0.5ML SOAJ Once weekly subcutaneous 06/26/22   Babs Sciara, MD  Vitamin D, Ergocalciferol, (DRISDOL) 1.25 MG (50000 UNIT) CAPS capsule Take 1 capsule (50,000 Units total) by mouth every 7 (seven) days. Patient taking differently: Take 50,000 Units by mouth every Tuesday. 06/05/22  Yes Babs Sciara, MD  Insulin Pen Needle 31G X 5 MM MISC Use daily with saxenda 06/15/21   Babs Sciara, MD  omeprazole (PRILOSEC) 40 MG capsule Take 1 capsule (40 mg total) by mouth daily 30 minutes before breakfast with water. Patient not taking: Reported on 06/27/2022 04/12/22   Babs Sciara, MD  polyethylene glycol-electrolytes (NULYTELY) 420 g solution Take 4,000 mLs by mouth once. 06/21/22   [provider]    Allergies as of 06/21/2022   (No Known Allergies)    Family History  Problem Relation Age of Onset   Hypertension Mother    Hyperlipidemia Mother    Hypertension Father    Anesthesia problems Neg Hx    Hypotension Neg Hx    Malignant hyperthermia Neg Hx    Pseudochol deficiency Neg Hx    Colon cancer Neg Hx    Colon polyps Neg Hx    Inflammatory bowel disease Neg Hx     Social History   Socioeconomic History   Marital status: Married    Spouse name: Not on file   Number of children: Not on file   Years of education:  Not on file   Highest education level: Not on file  Occupational History   Occupation: [employer]  Tobacco Use   Smoking status: Never   Smokeless tobacco: Never  Vaping Use   Vaping Use: Never used  Substance and Sexual Activity   Alcohol use: No    Alcohol/week: 0.0 standard drinks of alcohol   Drug use: No   Sexual activity: Yes    Birth control/protection: I.U.D.  Other Topics Concern   Not on file  Social History Narrative   Not on file   Social Determinants of Health   Financial Resource Strain: Low Risk  (06/23/2021)   Overall Financial Resource Strain (CARDIA)    Difficulty of Paying Living Expenses: Not hard at all  Food Insecurity: No Food Insecurity (06/23/2021)   Hunger Vital Sign    Worried About Running Out of Food in the Last Year: Never true    Ran Out of Food in the Last Year: Never true  Transportation Needs: No Transportation Needs (06/23/2021)   PRAPARE - Administrator, Civil Service (Medical): No    Lack of Transportation (Non-Medical): No  Physical Activity: Unknown (06/23/2021)   Exercise Vital Sign    Days of Exercise per Week: 3 days    Minutes of Exercise per Session: Not on file  Stress: Stress Concern Present (06/23/2021)   [employer] of Occupational Health - Occupational Stress Questionnaire    Feeling of Stress : To some extent  Social Connections: Socially Integrated (06/23/2021)   Social Connection and Isolation Panel [NHANES]    Frequency of Communication with Friends and Family: More than three times a week    Frequency of Social Gatherings with Friends and Family: Three times a week    Attends Religious Services: 1 to 4 times per year    Active Member of Clubs or Organizations: Yes    Attends Banker Meetings: More than 4 times per year    Marital Status: Married  Catering manager Violence: Not At Risk (06/23/2021)   Humiliation, Afraid, Rape, and Kick questionnaire    Fear of Current or Ex-Partner: No     Emotionally Abused: No    Physically Abused: No    Sexually Abused: No    Review of Systems: General: Negative for fever, chills, fatigue, weakness. Eyes: Negative for vision changes.  ENT: Negative for hoarseness, difficulty swallowing , nasal congestion. CV: Negative for chest pain, angina, palpitations, dyspnea on exertion, peripheral edema.  Respiratory: Negative for dyspnea at rest, dyspnea on exertion, cough, sputum, wheezing.  GI: See history of present illness. GU:  Negative for dysuria, hematuria, urinary incontinence, urinary frequency, nocturnal urination.  MS: Negative for joint pain, low back pain.  Derm: Negative for rash or itching.  Neuro: Negative for weakness, abnormal sensation, seizure, frequent headaches, memory loss, confusion.  Psych: Negative for anxiety, depression Endo: Negative for  unusual weight change.  Heme: Negative for bruising or bleeding. Allergy: Negative for rash or hives.  Physical Exam: Vital signs in last 24 hours: Temp:  [97.9 F (36.6 C)] 97.9 F (36.6 C) (05/29 0710) Pulse Rate:  [73] 73 (05/29 0710) Resp:  [12] 12 (05/29 0710) BP: (139)/(92) 139/92 (05/29 0710) SpO2:  [100 %] 100 % (05/29 0710) Weight:  [117.6 kg] 117.6 kg (05/29 0710)   General:   Alert,  Well-developed, well-nourished, pleasant and cooperative in NAD Head:  Normocephalic and atraumatic. Eyes:  Sclera clear, no icterus.   Conjunctiva pink. Ears:  Normal auditory acuity. Nose:  No deformity, discharge,  or lesions. Msk:  Symmetrical without gross deformities. Normal posture. Extremities:  Without clubbing or edema. Neurologic:  Alert and  oriented x4;  grossly normal neurologically. Skin:  Intact without significant lesions or rashes. Psych:  Alert and cooperative. Normal mood and affect.   Impression/Plan: Brandi Dunn is here for an EGD with possible dilation to be performed for dysphagia/GERD and colonoscopy for chronic diarrhea  Risks, benefits,  limitations, imponderables and alternatives regarding EGD have been reviewed with the patient. Questions have been answered. All parties agreeable.

## 2022-07-05 NOTE — Op Note (Signed)
[facility] Patient Name: Brandi Dunn Procedure Date: 07/05/2022 7:00 AM MRN: [id number] Date of Birth: August 22, 1980 Attending MD: Hennie Duos. Marletta Lor , [place], [id number] CSN: [id number] Age: 42 Admit Type: Outpatient Procedure:                Colonoscopy Indications:              Chronic diarrhea Providers:                Hennie Duos. Marletta Lor, DO, Sheran Fava, Angela A.                            Thomasena Edis RN, RN, Pandora Leiter, Technician, Dyann Ruddle Referring MD:              Medicines:                See the Anesthesia note for documentation of the                            administered medications Complications:            No immediate complications. Estimated Blood Loss:     Estimated blood loss was minimal. Procedure:                Pre-Anesthesia Assessment:                           - The anesthesia plan was to use monitored                            anesthesia care (MAC).                           After obtaining informed consent, the colonoscope                            was passed under direct vision. Throughout the                            procedure, the patient's blood pressure, pulse, and                            oxygen saturations were monitored continuously. The                            [id number]-HQ190L ([id number]) scope was introduced through                            the anus and advanced to the the cecum, identified                            by appendiceal orifice and ileocecal valve. The                            colonoscopy was performed without difficulty. The  patient tolerated the procedure well. The quality                            of the bowel preparation was evaluated using the                            BBPS [facility] Bowel Preparation Scale) with scores                            of: Right Colon = 3, Transverse Colon = 3 and Left                            Colon = 3 (entire mucosa seen well  with no residual                            staining, small fragments of stool or opaque                            liquid). The total BBPS score equals 9. Scope In: 7:49:26 AM Scope Out: 8:01:19 AM Scope Withdrawal Time: 0 hours 9 minutes 53 seconds  Total Procedure Duration: 0 hours 11 minutes 53 seconds  Findings:      Non-bleeding internal hemorrhoids were found during endoscopy.      The terminal ileum appeared normal.      The exam was otherwise without abnormality.      Biopsies for histology were taken with a cold forceps from the ascending       colon, transverse colon and descending colon for evaluation of       microscopic colitis. Impression:               - Non-bleeding internal hemorrhoids.                           - The examined portion of the ileum was normal.                           - The examination was otherwise normal.                           - Biopsies were taken with a cold forceps from the                            ascending colon, transverse colon and descending                            colon for evaluation of microscopic colitis. Moderate Sedation:      Per Anesthesia Care Recommendation:           - Patient has a contact number available for                            emergencies. The signs and symptoms of potential  delayed complications were discussed with the                            patient. Return to normal activities tomorrow.                            Written discharge instructions were provided to the                            patient.                           - Resume previous diet.                           - Continue present medications.                           - Await pathology results.                           - Repeat colonoscopy in 10 years for screening                            purposes.                           - Return to GI clinic in 6 weeks. Procedure Code(s):        --- Professional ---                            [phone], Colonoscopy, flexible; with biopsy, single                            or multiple Diagnosis Code(s):        --- Professional ---                           K64.8, Other hemorrhoids                           K52.9, Noninfective gastroenteritis and colitis,                            unspecified CPT copyright 2022 American Medical Association. All rights reserved. The codes documented in this report are preliminary and upon coder review may  be revised to meet current compliance requirements. Hennie Duos. Marletta Lor, DO Hennie Duos. Marletta Lor, DO 07/05/2022 8:05:20 AM This report has been signed electronically. Number of Addenda: 0

## 2022-07-05 NOTE — Discharge Instructions (Addendum)
EGD Discharge instructions Please read the instructions outlined below and refer to this sheet in the next few weeks. These discharge instructions provide you with general information on caring for yourself after you leave the hospital. Your doctor may also give you specific instructions. While your treatment has been planned according to the most current medical practices available, unavoidable complications occasionally occur. If you have any problems or questions after discharge, please call your doctor. ACTIVITY You may resume your regular activity but move at a slower pace for the next 24 hours.  Take frequent rest periods for the next 24 hours.  Walking will help expel (get rid of) the air and reduce the bloated feeling in your abdomen.  No driving for 24 hours (because of the anesthesia (medicine) used during the test).  You may shower.  Do not sign any important legal documents or operate any machinery for 24 hours (because of the anesthesia used during the test).  NUTRITION Drink plenty of fluids.  You may resume your normal diet.  Begin with a light meal and progress to your normal diet.  Avoid alcoholic beverages for 24 hours or as instructed by your caregiver.  MEDICATIONS You may resume your normal medications unless your caregiver tells you otherwise.  WHAT YOU CAN EXPECT TODAY You may experience abdominal discomfort such as a feeling of fullness or "gas" pains.  FOLLOW-UP Your doctor will discuss the results of your test with you.  SEEK IMMEDIATE MEDICAL ATTENTION IF ANY OF THE FOLLOWING OCCUR: Excessive nausea (feeling sick to your stomach) and/or vomiting.  Severe abdominal pain and distention (swelling).  Trouble swallowing.  Temperature over 101 F (37.8 C).  Rectal bleeding or vomiting of blood.     Colonoscopy Discharge Instructions  Read the instructions outlined below and refer to this sheet in the next few weeks. These discharge instructions provide you with  general information on caring for yourself after you leave the hospital. Your doctor may also give you specific instructions. While your treatment has been planned according to the most current medical practices available, unavoidable complications occasionally occur.   ACTIVITY You may resume your regular activity, but move at a slower pace for the next 24 hours.  Take frequent rest periods for the next 24 hours.  Walking will help get rid of the air and reduce the bloated feeling in your belly (abdomen).  No driving for 24 hours (because of the medicine (anesthesia) used during the test).   Do not sign any important legal documents or operate any machinery for 24 hours (because of the anesthesia used during the test).  NUTRITION Drink plenty of fluids.  You may resume your normal diet as instructed by your doctor.  Begin with a light meal and progress to your normal diet. Heavy or fried foods are harder to digest and may make you feel sick to your stomach (nauseated).  Avoid alcoholic beverages for 24 hours or as instructed.  MEDICATIONS You may resume your normal medications unless your doctor tells you otherwise.  WHAT YOU CAN EXPECT TODAY Some feelings of bloating in the abdomen.  Passage of more gas than usual.  Spotting of blood in your stool or on the toilet paper.  IF YOU HAD POLYPS REMOVED DURING THE COLONOSCOPY: No aspirin products for 7 days or as instructed.  No alcohol for 7 days or as instructed.  Eat a soft diet for the next 24 hours.  FINDING OUT THE RESULTS OF YOUR TEST Not all test results are  available during your visit. If your test results are not back during the visit, make an appointment with your caregiver to find out the results. Do not assume everything is normal if you have not heard from your caregiver or the medical facility. It is important for you to follow up on all of your test results.  SEEK IMMEDIATE MEDICAL ATTENTION IF: You have more than a spotting of  blood in your stool.  Your belly is swollen (abdominal distention).  You are nauseated or vomiting.  You have a temperature over 101.  You have abdominal pain or discomfort that is severe or gets worse throughout the day.   Your EGD revealed mild amount inflammation in your stomach.  I took biopsies of this to rule out infection with a bacteria called H. pylori.   Esophagus was wide open, no tightenings or strictures. I did not need to dilate today. You do have a small hiatal hernia.  Small bowel appeared normal.  I am going to increase your pantoprazole to 40 mg twice daily for the next 12 weeks at which point you can decrease back down to once daily.  Overall your colon looked very healthy.  I did not find any polyps or evidence of colon cancer.  I did not see any inflammation in your entire colon indicative of underlying inflammatory bowel disease.  I did take samples of your entire colon to rule out a condition called microscopic colitis which can cause chronically loose stools.  Repeat colonoscopy in 10 years for colon cancer screening purposes.   Await pathology results, my office will contact you.  Follow-up in GI office in 6 weeks.   I hope you have a great rest of your week!  Hennie Duos. Marletta Lor, D.O. Gastroenterology and Hepatology [facility]

## 2022-07-05 NOTE — Op Note (Signed)
[facility] Patient Name: Brandi Dunn Procedure Date: 07/05/2022 7:02 AM MRN: [id number] Date of Birth: December 13, 1980 Attending MD: Hennie Duos. Marletta Lor , [place], [phone] CSN: [id number] Age: 42 Admit Type: Outpatient Procedure:                Upper GI endoscopy Indications:              Dysphagia, Heartburn Providers:                Hennie Duos. Marletta Lor, DO, Sheran Fava, Angela A.                            Thomasena Edis RN, RN, Pandora Leiter, Technician, Dyann Ruddle Referring MD:              Medicines:                See the Anesthesia note for documentation of the                            administered medications Complications:            No immediate complications. Estimated Blood Loss:     Estimated blood loss was minimal. Procedure:                Pre-Anesthesia Assessment:                           - The anesthesia plan was to use monitored                            anesthesia care (MAC).                           After obtaining informed consent, the endoscope was                            passed under direct vision. Throughout the                            procedure, the patient's blood pressure, pulse, and                            oxygen saturations were monitored continuously. The                            GIF-H190 ([id number]) scope was introduced through the                            mouth, and advanced to the second part of duodenum.                            The upper GI endoscopy was accomplished without                            difficulty. The patient tolerated the  procedure                            well. Scope In: 7:42:07 AM Scope Out: 7:45:49 AM Total Procedure Duration: 0 hours 3 minutes 42 seconds  Findings:      A small hiatal hernia was present.      There is no endoscopic evidence of stenosis or stricture in the entire       esophagus.      Patchy mild inflammation characterized by erythema was found in the        stomach. Biopsies were taken with a cold forceps for Helicobacter pylori       testing.      The duodenal bulb, first portion of the duodenum and second portion of       the duodenum were normal. Impression:               - Small hiatal hernia.                           - Gastritis. Biopsied.                           - Normal duodenal bulb, first portion of the                            duodenum and second portion of the duodenum. Moderate Sedation:      Per Anesthesia Care Recommendation:           - Patient has a contact number available for                            emergencies. The signs and symptoms of potential                            delayed complications were discussed with the                            patient. Return to normal activities tomorrow.                            Written discharge instructions were provided to the                            patient.                           - Resume previous diet.                           - Continue present medications.                           - Await pathology results.                           - Use Protonix (pantoprazole) 40 mg PO BID for 12  weeks.                           - Return to GI clinic in 6 weeks. Procedure Code(s):        --- Professional ---                           [phone], Esophagogastroduodenoscopy, flexible,                            transoral; with biopsy, single or multiple Diagnosis Code(s):        --- Professional ---                           K44.9, Diaphragmatic hernia without obstruction or                            gangrene                           K29.70, Gastritis, unspecified, without bleeding                           R13.10, Dysphagia, unspecified                           R12, Heartburn CPT copyright 2022 American Medical Association. All rights reserved. The codes documented in this report are preliminary and upon coder review may  be revised to meet  current compliance requirements. Hennie Duos. Marletta Lor, DO Hennie Duos. Marletta Lor, DO 07/05/2022 7:47:45 AM This report has been signed electronically. Number of Addenda: 0

## 2022-07-05 NOTE — Anesthesia Preprocedure Evaluation (Signed)
Anesthesia Evaluation  Patient identified by MRN, date of birth, ID band Patient awake    Reviewed: Allergy & Precautions, H&P , NPO status , Patient's Chart, lab work & pertinent test results, reviewed documented beta blocker date and time   Airway Mallampati: II  TM Distance: >3 FB Neck ROM: full    Dental no notable dental hx.    Pulmonary neg pulmonary ROS   Pulmonary exam normal breath sounds clear to auscultation       Cardiovascular Exercise Tolerance: Good negative cardio ROS  Rhythm:regular Rate:Normal     Neuro/Psych  Headaches PSYCHIATRIC DISORDERS Anxiety     negative neurological ROS  negative psych ROS   GI/Hepatic negative GI ROS, Neg liver ROS,GERD  ,,  Endo/Other    Morbid obesity  Renal/GU Renal diseasenegative Renal ROS  negative genitourinary   Musculoskeletal   Abdominal   Peds  Hematology negative hematology ROS (+)   Anesthesia Other Findings   Reproductive/Obstetrics negative OB ROS                             Anesthesia Physical Anesthesia Plan  ASA: 3  Anesthesia Plan: General   Post-op Pain Management:    Induction:   PONV Risk Score and Plan: Propofol infusion  Airway Management Planned:   Additional Equipment:   Intra-op Plan:   Post-operative Plan:   Informed Consent: I have reviewed the patients History and Physical, chart, labs and discussed the procedure including the risks, benefits and alternatives for the proposed anesthesia with the patient or authorized representative who has indicated his/her understanding and acceptance.     Dental Advisory Given  Plan Discussed with: CRNA  Anesthesia Plan Comments:        Anesthesia Quick Evaluation

## 2022-07-06 LAB — SURGICAL PATHOLOGY

## 2022-07-07 NOTE — Telephone Encounter (Signed)
PA form completed and awaiting provider's signature

## 2022-07-09 ENCOUNTER — Telehealth: Payer: Self-pay | Admitting: Internal Medicine

## 2022-07-09 MED ORDER — METRONIDAZOLE 500 MG PO TABS: 500.0000 mg | ORAL_TABLET | Freq: Three times a day (TID) | ORAL | 0 refills | Status: AC

## 2022-07-09 MED ORDER — BISMUTH 262 MG PO CHEW: 2.0000 | CHEWABLE_TABLET | Freq: Four times a day (QID) | ORAL | 0 refills | Status: AC

## 2022-07-09 MED ORDER — TETRACYCLINE HCL 500 MG PO CAPS: 500.0000 mg | ORAL_CAPSULE | Freq: Four times a day (QID) | ORAL | 0 refills | Status: AC

## 2022-07-09 NOTE — Telephone Encounter (Signed)
Patient has H. pylori gastritis.  I will send in 14-day course of tetracycline 500 mg 4 times daily, metronidazole 500 mg 3 times daily.   Continue pantoprazole 40 mg twice daily.   I will also send in bismuth to take 4 times daily as well.    Follow-up as previously scheduled to discuss eradication testing.  Thank you  

## 2022-07-10 NOTE — Telephone Encounter (Signed)
Phoned the pt and phone call could not be completed at this time (pt's phone may ne turned off for now)

## 2022-07-10 NOTE — Telephone Encounter (Signed)
Phone the pt, bad phone connection will call back

## 2022-07-11 ENCOUNTER — Encounter (HOSPITAL_COMMUNITY): Payer: Self-pay | Admitting: Internal Medicine

## 2022-07-11 NOTE — Telephone Encounter (Signed)
Phoned and spoke with the pt and advised of her result note, medications with instructions. Also sent to her in her MyChart so she could refer back to everything. Pt expressed understanding and she also agreed to me sending it to her in the MyChart

## 2022-07-12 NOTE — Anesthesia Postprocedure Evaluation (Signed)
Anesthesia Post Note  Patient: Brandi Dunn  Procedure(s) Performed: COLONOSCOPY WITH PROPOFOL ESOPHAGOGASTRODUODENOSCOPY (EGD) WITH PROPOFOL BIOPSY  Patient location during evaluation: Phase II Anesthesia Type: General Level of consciousness: awake Pain management: pain level controlled Vital Signs Assessment: post-procedure vital signs reviewed and stable Respiratory status: spontaneous breathing and respiratory function stable Cardiovascular status: blood pressure returned to baseline and stable Postop Assessment: no headache and no apparent nausea or vomiting Anesthetic complications: no Comments: Late entry   No notable events documented.   Last Vitals:  Vitals:   07/05/22 0710 07/05/22 0804  BP: (!) 139/92 107/68  Pulse: 73 85  Resp: 12 15  Temp: 36.6 C 36.5 C  SpO2: 100% 100%    Last Pain:  Vitals:   07/05/22 0807  TempSrc:   PainSc: 0-No pain                 Windell Norfolk

## 2022-07-13 ENCOUNTER — Ambulatory Visit: Payer: BC Managed Care – PPO | Admitting: Gastroenterology

## 2022-08-14 ENCOUNTER — Encounter: Payer: Self-pay | Admitting: Family Medicine

## 2022-08-14 ENCOUNTER — Ambulatory Visit: Payer: BC Managed Care – PPO | Admitting: Family Medicine

## 2022-08-15 ENCOUNTER — Encounter: Payer: Self-pay | Admitting: Family Medicine

## 2022-08-22 NOTE — Telephone Encounter (Signed)
Nurses-May go with Wegovy 0.5 mg once weekly, send in 1 month with 2 refills  Brandi Dunn can send [place] an update in several weeks if she is tolerating that well she can request the next dose which would be 1.0  If she is having any setbacks she needs to let us know She is also due for his standard follow-up office visit to help document the progress that she is making with this medication-I would encourage her to go ahead and get this scheduled up for August or early September at the latest  Thanks-Dr. Lorin Picket

## 2022-08-24 MED ORDER — SEMAGLUTIDE-WEIGHT MANAGEMENT 0.5 MG/0.5ML ~~LOC~~ SOAJ: 0.5000 mg | SUBCUTANEOUS | 2 refills | Status: DC

## 2022-08-24 NOTE — Telephone Encounter (Signed)
Mychart message sent and script sent to pharmacy per Dr Lorin Picket.

## 2022-08-30 ENCOUNTER — Ambulatory Visit: Payer: BC Managed Care – PPO | Admitting: Family Medicine

## 2022-08-30 VITALS — BP 117/77 | HR 85 | Wt 269.8 lb

## 2022-08-30 DIAGNOSIS — R3989 Other symptoms and signs involving the genitourinary system: Secondary | ICD-10-CM | POA: Diagnosis not present

## 2022-08-30 DIAGNOSIS — R351 Nocturia: Secondary | ICD-10-CM

## 2022-08-30 DIAGNOSIS — R635 Abnormal weight gain: Secondary | ICD-10-CM

## 2022-08-30 NOTE — Progress Notes (Signed)
   Subjective:    Patient ID: Brandi Dunn, female    DOB: 09-Jun-1980, 42 y.o.   MRN: [id number]  HPI Patient arrives today with issues with her bladder. Patient states she is having some issues with holding her urine and cannot tell if she has has to go. Patient states this has been going on for months. Patient denies pain with urination.  Nocturia - Plan: Prolactin, Ambulatory referral to Urogynecology, Urine Culture  Weight gain - Plan: TSH + free T4  Morbid obesity (HCC) - Plan: Basic Metabolic Panel (7), Hemoglobin A1c  Suspected UTI - Plan: Ambulatory referral to Urogynecology, Urine Culture, Urinalysis, microscopic only  Patient dealing with morbid obesity trying to lose weight.  Unfortunately there was noted that she got she cannot get on a regular basis because of production issues in addition to this have not urinary frequency increased thirst no dysuria.  Possibility of diabetes exists.  Patient very discouraged.  Review of Systems     Objective:   Physical Exam  General-in no acute distress Eyes-no discharge Lungs-respiratory rate normal, CTA CV-no murmurs,RRR Extremities skin warm dry no edema Neuro grossly normal Behavior normal, alert       Assessment & Plan:  1. Nocturia Recommend consultation urogynecology try to make sure that she is not developing any type of diabetes check lab work await the results. - Prolactin - Ambulatory referral to Urogynecology - Urine Culture  2. Weight gain Significant weight gain will check lab work portion control regular activity Wegovy would help but also gastric sleeve and bypass she will consider these and let us know which ones he chooses unfortunately Wegovy and short supply currently - TSH + free T4  3. Morbid obesity (HCC) Portion control regular physical activity - Basic Metabolic Panel (7) - Hemoglobin A1c  4. Suspected UTI Urine culture recommended - Ambulatory referral to Urogynecology - Urine Culture -  Urinalysis, microscopic only

## 2022-09-01 DIAGNOSIS — R3989 Other symptoms and signs involving the genitourinary system: Secondary | ICD-10-CM | POA: Diagnosis not present

## 2022-09-01 DIAGNOSIS — R351 Nocturia: Secondary | ICD-10-CM | POA: Diagnosis not present

## 2022-09-01 DIAGNOSIS — N390 Urinary tract infection, site not specified: Secondary | ICD-10-CM | POA: Diagnosis not present

## 2022-09-01 DIAGNOSIS — R635 Abnormal weight gain: Secondary | ICD-10-CM | POA: Diagnosis not present

## 2022-09-05 ENCOUNTER — Other Ambulatory Visit: Payer: Self-pay | Admitting: Family Medicine

## 2022-09-05 MED ORDER — CEPHALEXIN 500 MG PO CAPS: 500.0000 mg | ORAL_CAPSULE | Freq: Four times a day (QID) | ORAL | 0 refills | Status: DC

## 2022-09-07 NOTE — Progress Notes (Signed)
Left message to return call 

## 2022-09-27 MED ORDER — OMEPRAZOLE 40 MG PO CPDR
40.0000 mg | DELAYED_RELEASE_CAPSULE | Freq: Every day | ORAL | 3 refills | Status: DC
Start: 2022-09-27 — End: 2023-09-13

## 2022-10-03 ENCOUNTER — Other Ambulatory Visit: Payer: Self-pay | Admitting: Family Medicine

## 2022-10-03 DIAGNOSIS — R7989 Other specified abnormal findings of blood chemistry: Secondary | ICD-10-CM

## 2022-10-31 ENCOUNTER — Encounter: Payer: Self-pay | Admitting: Urology

## 2022-10-31 ENCOUNTER — Ambulatory Visit: Payer: BC Managed Care – PPO | Admitting: Urology

## 2022-10-31 ENCOUNTER — Ambulatory Visit (HOSPITAL_COMMUNITY): Payer: BC Managed Care – PPO

## 2022-10-31 VITALS — BP 126/84 | HR 105

## 2022-10-31 DIAGNOSIS — R1031 Right lower quadrant pain: Secondary | ICD-10-CM | POA: Diagnosis not present

## 2022-10-31 DIAGNOSIS — Z87442 Personal history of urinary calculi: Secondary | ICD-10-CM

## 2022-10-31 DIAGNOSIS — R351 Nocturia: Secondary | ICD-10-CM

## 2022-10-31 DIAGNOSIS — N200 Calculus of kidney: Secondary | ICD-10-CM

## 2022-10-31 DIAGNOSIS — R3129 Other microscopic hematuria: Secondary | ICD-10-CM

## 2022-10-31 LAB — URINALYSIS, ROUTINE W REFLEX MICROSCOPIC
Bilirubin, UA: NEGATIVE
Glucose, UA: NEGATIVE
Ketones, UA: NEGATIVE
Nitrite, UA: NEGATIVE
Specific Gravity, UA: 1.025 (ref 1.005–1.030)
Urobilinogen, Ur: 1 mg/dL (ref 0.2–1.0)
pH, UA: 7 (ref 5.0–7.5)

## 2022-10-31 LAB — MICROSCOPIC EXAMINATION
RBC, Urine: >30 /hpf — AB (ref 0–2)
WBC, UA: >30 /hpf — AB (ref 0–5)

## 2022-10-31 MED ORDER — OXYCODONE HCL 5 MG PO TABS
5.0000 mg | ORAL_TABLET | Freq: Four times a day (QID) | ORAL | 0 refills | Status: DC | PRN
Start: 2022-10-31 — End: 2023-04-23

## 2022-10-31 MED ORDER — KETOROLAC TROMETHAMINE 60 MG/2ML IM SOLN
60.0000 mg | Freq: Once | INTRAMUSCULAR | Status: AC
Start: 2022-10-31 — End: 2022-10-31
  Administered 2022-10-31: 60 mg via INTRAMUSCULAR

## 2022-10-31 NOTE — Progress Notes (Signed)
IM injection Medication: Toradol Dose: 60 mg Location: right Lot: [id number] Exp: 09/2023  Patient tolerated well, no complications were noted  Performed by: Guss Bunde, CMA

## 2022-10-31 NOTE — Progress Notes (Addendum)
Patient is made aware her CT is pending authorization and it could take about 2 days for the authorization. Patient voiced understanding.

## 2022-10-31 NOTE — Progress Notes (Signed)
H&P  Chief Complaint: Right flank pain  History of Present Illness: Brandi Dunn is a 42 y.o. year old female urgently worked in for possible kidney stone.  She had a right ureteral stone passed about a year ago.  At that time, CT scan revealed a remaining small right renal stone.  Began having right flank pain, colicky in nature about a week ago, became worse 2 days ago.  No gross hematuria.  Finishing up her period.  She is not having fever or chills but has had some nausea and vomiting.  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    Past Surgical History:  Procedure Laterality Date   BIOPSY  07/05/2022   Procedure: BIOPSY;  Surgeon: Lanelle Bal, DO;  Location: [facility];  Service: Endoscopy;;   CHOLECYSTECTOMY  04/12/2011   COLONOSCOPY WITH PROPOFOL N/A 07/05/2022   Procedure: COLONOSCOPY WITH PROPOFOL;  Surgeon: Lanelle Bal, DO;  Location: [facility];  Service: Endoscopy;  Laterality: N/A;  730am, asa 3   CYSTOSCOPY W/ URETERAL STENT PLACEMENT Left 09/10/2012   Procedure: CYSTOSCOPY WITH LEFT RETROGRADE PYELOGRAM; LEFT URETERAL STENT PLACEMENT;  Surgeon: Ky Barban, MD;  Location: [facility];  Service: Urology;  Laterality: Left;   DILATION AND CURETTAGE OF UTERUS  2000?   x2, [facility]   ESOPHAGOGASTRODUODENOSCOPY (EGD) WITH PROPOFOL N/A 07/05/2022   Procedure: ESOPHAGOGASTRODUODENOSCOPY (EGD) WITH PROPOFOL;  Surgeon: Lanelle Bal, DO;  Location: [facility];  Service: Endoscopy;  Laterality: N/A;   PARATHYROIDECTOMY Left 04/08/2020   Procedure: LEFT INFERIOR PARATHYROIDECTOMY;  Surgeon: Darnell Level, MD;  Location: [facility];  Service: General;  Laterality: Left;  LDOW ROOM 4  90 MIN   STONE EXTRACTION WITH BASKET Left 09/10/2012   Procedure: BALLOON DILATATION LEFT URETER; LEFT URETEROSCOPIC  STONE EXTRACTION WITH BASKET;  Surgeon: Ky Barban, MD;  Location: [facility];  Service: Urology;  Laterality: Left;    Home Medications:  (Not in a hospital admission)   Allergies: No Known Allergies  Family History  Problem Relation Age of Onset   Hypertension Mother    Hyperlipidemia Mother    Hypertension Father    Anesthesia problems Neg Hx    Hypotension Neg Hx    Malignant hyperthermia Neg Hx    Pseudochol deficiency Neg Hx    Colon cancer Neg Hx    Colon polyps Neg Hx    Inflammatory bowel disease Neg Hx     Social History:  reports that she has never smoked. She has never used smokeless tobacco. She reports that she does not drink alcohol and does not use drugs.  ROS: A complete review of systems was performed.  All systems are negative except for pertinent findings as noted.  Physical Exam:  Vital signs in last 24 hours: @VSRANGES @ General:  Alert and oriented, No acute distress HEENT: Normocephalic, atraumatic Neck: No JVD or lymphadenopathy Cardiovascular: Regular rate  Lungs: Normal inspiratory/expiratory excursion Abdomen: Soft, nontender, nondistended, no abdominal masses.  She does have right CVA tenderness.  Abdomen is obese. Extremities: No edema Neurologic: Grossly intact  I have reviewed prior pt notes  I have reviewed notes from referring/previous physicians--Dr. Luking's most recent note  I have reviewed urinalysis results  I have independently reviewed prior imaging--CT scan  images from June 2024 reviewed  I have reviewed prior urine culture   Impression/Assessment:  Possible right ureteral stone  Hematuria, microscopic the patient on her menstrual period  Plan:  I will culture her urine  She was given Toradol today  I will give her a prescription for oxycodone  I will also send her for stone protocol CT.  Bertram Millard Antoinetta Berrones 10/31/2022, 9:08 AM  Bertram Millard. Clarke Amburn MD

## 2022-11-03 ENCOUNTER — Emergency Department (HOSPITAL_COMMUNITY): Payer: BC Managed Care – PPO

## 2022-11-03 ENCOUNTER — Encounter (HOSPITAL_COMMUNITY): Payer: Self-pay | Admitting: *Deleted

## 2022-11-03 ENCOUNTER — Emergency Department (HOSPITAL_COMMUNITY)
Admission: EM | Admit: 2022-11-03 | Discharge: 2022-11-03 | Disposition: A | Discharge status: Home / Self Care | Payer: BC Managed Care – PPO

## 2022-11-03 ENCOUNTER — Other Ambulatory Visit: Payer: Self-pay

## 2022-11-03 DIAGNOSIS — K429 Umbilical hernia without obstruction or gangrene: Secondary | ICD-10-CM | POA: Diagnosis not present

## 2022-11-03 DIAGNOSIS — R109 Unspecified abdominal pain: Secondary | ICD-10-CM | POA: Diagnosis not present

## 2022-11-03 DIAGNOSIS — N12 Tubulo-interstitial nephritis, not specified as acute or chronic: Secondary | ICD-10-CM | POA: Insufficient documentation

## 2022-11-03 DIAGNOSIS — Z794 Long term (current) use of insulin: Secondary | ICD-10-CM | POA: Diagnosis not present

## 2022-11-03 DIAGNOSIS — N1330 Unspecified hydronephrosis: Secondary | ICD-10-CM | POA: Diagnosis not present

## 2022-11-03 DIAGNOSIS — N134 Hydroureter: Secondary | ICD-10-CM | POA: Diagnosis not present

## 2022-11-03 LAB — URINALYSIS, ROUTINE W REFLEX MICROSCOPIC
Bilirubin Urine: NEGATIVE
Glucose, UA: NEGATIVE mg/dL
Ketones, ur: 5 mg/dL — AB
Nitrite: NEGATIVE
Protein, ur: NEGATIVE mg/dL
Specific Gravity, Urine: 1.012 (ref 1.005–1.030)
WBC, UA: >50 WBC/hpf (ref 0–5)
pH: 6 (ref 5.0–8.0)

## 2022-11-03 LAB — BASIC METABOLIC PANEL
Anion gap: 10 (ref 5–15)
BUN: 8 mg/dL (ref 6–20)
CO2: 26 mmol/L (ref 22–32)
Calcium: 8.4 mg/dL — ABNORMAL LOW (ref 8.9–10.3)
Chloride: 99 mmol/L (ref 98–111)
Creatinine, Ser: 0.95 mg/dL (ref 0.44–1.00)
GFR, Estimated: >60 mL/min (ref >60)
Glucose, Bld: 111 mg/dL — ABNORMAL HIGH (ref 70–99)
Potassium: 3.6 mmol/L (ref 3.5–5.1)
Sodium: 135 mmol/L (ref 135–145)

## 2022-11-03 LAB — CBC
HCT: 37.7 % (ref 36.0–46.0)
Hemoglobin: 12.2 g/dL (ref 12.0–15.0)
MCH: 28.1 pg (ref 26.0–34.0)
MCHC: 32.4 g/dL (ref 30.0–36.0)
MCV: 86.9 fL (ref 80.0–100.0)
Platelets: 222 10*3/uL (ref 150–400)
RBC: 4.34 MIL/uL (ref 3.87–5.11)
RDW: 13 % (ref 11.5–15.5)
WBC: 8.7 10*3/uL (ref 4.0–10.5)
nRBC: 0 % (ref 0.0–0.2)

## 2022-11-03 LAB — POC URINE PREG, ED: Preg Test, Ur: NEGATIVE

## 2022-11-03 MED ORDER — KETOROLAC TROMETHAMINE 15 MG/ML IJ SOLN
15.0000 mg | Freq: Once | INTRAMUSCULAR | Status: AC
  Administered 2022-11-03: 15 mg via INTRAVENOUS
  Filled 2022-11-03: qty 1

## 2022-11-03 MED ORDER — LACTATED RINGERS IV BOLUS
1000.0000 mL | Freq: Once | INTRAVENOUS | Status: AC
  Administered 2022-11-03: 1000 mL via INTRAVENOUS

## 2022-11-03 MED ORDER — SODIUM CHLORIDE 0.9 % IV SOLN
1.0000 g | Freq: Once | INTRAVENOUS | Status: AC
  Administered 2022-11-03: 1 g via INTRAVENOUS
  Filled 2022-11-03: qty 10

## 2022-11-03 MED ORDER — OXYCODONE-ACETAMINOPHEN 5-325 MG PO TABS
1.0000 | ORAL_TABLET | Freq: Four times a day (QID) | ORAL | 0 refills | Status: AC | PRN
Start: 2022-11-03 — End: 2022-11-06

## 2022-11-03 MED ORDER — ONDANSETRON HCL 4 MG PO TABS: 4.0000 mg | ORAL_TABLET | Freq: Four times a day (QID) | ORAL | 0 refills | Status: DC

## 2022-11-03 MED ORDER — CEPHALEXIN 500 MG PO CAPS: 500.0000 mg | ORAL_CAPSULE | Freq: Four times a day (QID) | ORAL | 0 refills | Status: AC

## 2022-11-03 MED ORDER — OXYCODONE-ACETAMINOPHEN 5-325 MG PO TABS
1.0000 | ORAL_TABLET | Freq: Once | ORAL | Status: AC
  Administered 2022-11-03: 1 via ORAL
  Filled 2022-11-03: qty 1

## 2022-11-03 NOTE — Discharge Instructions (Addendum)
Please follow-up with your primary doctor.  Return immediately if develop fevers, chills, uncontrolled nausea vomiting, severe pain, stop urinating or you develop any new or worsening symptoms that are concerning.

## 2022-11-03 NOTE — ED Triage Notes (Signed)
Pt c/o right flank pain that started x 4 days ago; pt has some nausea

## 2022-11-03 NOTE — ED Provider Notes (Signed)
[place] EMERGENCY DEPARTMENT AT [facility] Provider Note   CSN: [id number] Arrival date & time: 11/03/22  7829     History  Chief Complaint  Patient presents with   Flank Pain    Brandi Dunn is a 42 y.o. female.  42 year old female with history of prior kidney stones presented to the emergency department for right flank pain.  Symptoms x 4 days.  With nausea, no vomiting.  Feels similar to prior stones.  Symptoms of not resolved with his wife she presented today.  No urinary symptoms.  No fevers.   Flank Pain       Home Medications Prior to Admission medications   Medication Sig Start Date End Date Taking? Authorizing Provider  cephALEXin (KEFLEX) 500 MG capsule Take 1 capsule (500 mg total) by mouth 4 (four) times daily for 10 days. 11/03/22 11/13/22 Yes Kristof Nadeem J, DO  ondansetron (ZOFRAN) 4 MG tablet Take 1 tablet (4 mg total) by mouth every 6 (six) hours. 11/03/22  Yes Coral Spikes, DO  oxyCODONE-acetaminophen (PERCOCET/ROXICET) 5-325 MG tablet Take 1 tablet by mouth every 6 (six) hours as needed for up to 3 days for severe pain. 11/03/22 11/06/22 Yes Coral Spikes, DO  aspirin-acetaminophen-caffeine (EXCEDRIN MIGRAINE) [id number] MG tablet Take 2 tablets by mouth 2 (two) times daily as needed for headache or migraine.    [provider]  busPIRone (BUSPAR) 10 MG tablet 1 bid prn 04/12/22   Babs Sciara, MD  dicyclomine (BENTYL) 20 MG tablet Take 1 tablet (20 mg total) by mouth 3 (three) times daily as needed (IBS symptoms). Patient taking differently: Take 10 mg by mouth in the morning, at noon, in the evening, and at bedtime. 05/03/22   Babs Sciara, MD  DULoxetine (CYMBALTA) 60 MG capsule Take 1 capsule (60 mg total) by mouth daily. 04/12/22   Babs Sciara, MD  famotidine (PEPCID) 20 MG tablet Take 1 tablet (20 mg total) by mouth every evening. 04/12/22   Babs Sciara, MD  fluticasone (FLONASE) 50 MCG/ACT nasal spray Place 2 sprays  into both nostrils daily as needed for allergies. 09/21/20   [provider]  Insulin Pen Needle 31G X 5 MM MISC Use daily with saxenda 06/15/21   Babs Sciara, MD  levonorgestrel (MIRENA) 20 MCG/24HR IUD 1 each by Intrauterine route once.    [provider]  omeprazole (PRILOSEC) 40 MG capsule Take 1 capsule (40 mg total) by mouth daily. 09/27/22   Gelene Mink, NP  oxyCODONE (ROXICODONE) 5 MG immediate release tablet Take 1 tablet (5 mg total) by mouth every 6 (six) hours as needed for severe pain. 10/31/22   Marcine Matar, MD  pantoprazole (PROTONIX) 40 MG tablet Take 1 tablet (40 mg total) by mouth 2 (two) times daily. Take 30 minutes before breakfast 07/05/22 07/05/23  Lanelle Bal, DO  Semaglutide-Weight Management 0.5 MG/0.5ML SOAJ Inject 0.5 mg into the skin once a week. 08/24/22   Babs Sciara, MD  Vitamin D, Ergocalciferol, (DRISDOL) 1.25 MG (50000 UNIT) CAPS capsule Take 1 capsule (50,000 Units total) by mouth every 7 (seven) days. Patient taking differently: Take 50,000 Units by mouth every Tuesday. 06/05/22   Babs Sciara, MD      Allergies    Patient has no known allergies.    Review of Systems   Review of Systems  Genitourinary:  Positive for flank pain.    Physical Exam Updated Vital Signs BP 111/65  Pulse 89   Temp (!) 97.2 F (36.2 C) (Oral)   Resp 16   Ht 5\' 4"  (1.626 m)   Wt 113.4 kg   LMP 10/20/2022   SpO2 95%   BMI 42.91 kg/m  Physical Exam Vitals and nursing note reviewed.  Constitutional:      General: She is not in acute distress.    Appearance: She is not toxic-appearing.  HENT:     Head: Normocephalic and atraumatic.     Nose: Nose normal.     Mouth/Throat:     Mouth: Mucous membranes are moist.  Eyes:     Conjunctiva/sclera: Conjunctivae normal.  Cardiovascular:     Rate and Rhythm: Normal rate and regular rhythm.  Pulmonary:     Effort: Pulmonary effort is normal.     Breath sounds: Normal breath sounds.   Abdominal:     General: Abdomen is flat. There is no distension.     Tenderness: There is no abdominal tenderness. There is right CVA tenderness. There is no left CVA tenderness, guarding or rebound.  Musculoskeletal:        General: Normal range of motion.  Skin:    General: Skin is warm.     Capillary Refill: Capillary refill takes less than 2 seconds.  Neurological:     Mental Status: She is alert and oriented to person, place, and time.  Psychiatric:        Mood and Affect: Mood normal.        Behavior: Behavior normal.     ED Results / Procedures / Treatments   Labs (all labs ordered are listed, but only abnormal results are displayed) Labs Reviewed  BASIC METABOLIC PANEL - Abnormal; Notable for the following components:      Result Value   Glucose, Bld 111 (*)    Calcium 8.4 (*)    All other components within normal limits  URINALYSIS, ROUTINE W REFLEX MICROSCOPIC - Abnormal; Notable for the following components:   APPearance HAZY (*)    Hgb urine dipstick MODERATE (*)    Ketones, ur 5 (*)    Leukocytes,Ua MODERATE (*)    Bacteria, UA FEW (*)    All other components within normal limits  CBC  POC URINE PREG, ED    EKG None  Radiology CT Renal Stone Study  Result Date: 11/03/2022 CLINICAL DATA:  Abdominal/flank pain, stone suspected EXAM: CT ABDOMEN AND PELVIS WITHOUT CONTRAST TECHNIQUE: Multidetector CT imaging of the abdomen and pelvis was performed following the standard protocol without IV contrast. RADIATION DOSE REDUCTION: This exam was performed according to the departmental dose-optimization program which includes automated exposure control, adjustment of the mA and/or kV according to patient size and/or use of iterative reconstruction technique. COMPARISON:  CT scan abdomen and pelvis from 07/20/2021 and MRI abdomen from 04/21/2021. FINDINGS: Lower chest: There are subpleural atelectatic changes in the visualized lung bases. No overt consolidation. No pleural  effusion. The heart is normal in size. No pericardial effusion. Hepatobiliary: The liver is normal in size. Non-cirrhotic configuration. No suspicious mass. No intrahepatic or extrahepatic bile duct dilation. Gallbladder is surgically absent. Pancreas: There is subtle ill-defined hypoattenuating area in the posterior aspect of the pancreatic body, not well evaluated on the current exam but grossly similar to the prior study. Please refer to prior MRI report for additional details. No pancreatic ductal dilatation or surrounding inflammatory changes. Spleen: Within normal limits. No focal lesion. Adrenals/Urinary Tract: Adrenal glands are unremarkable. No suspicious renal mass within the  limitations of this exam. There is diffuse hypoattenuation of right kidney in comparison to the left. There is mild right hydronephrosis and hydroureter. However, no ureteric calculus or focal mass is seen. Findings may represent recent passage of right ureteric calculus. No right nephrolithiasis. No left nephroureterolithiasis or obstructive uropathy. Urinary bladder is minimally distended, precluding optimal assessment. However, no focal mass or calculi seen. No perivesical fat stranding. Stomach/Bowel: No disproportionate dilation of the small or large bowel loops. No evidence of abnormal bowel wall thickening or inflammatory changes. The appendix is unremarkable. Vascular/Lymphatic: No ascites or pneumoperitoneum. No abdominal or pelvic lymphadenopathy, by size criteria. No aneurysmal dilation of the major abdominal arteries. Reproductive: The uterus is unremarkable. A T-shaped intrauterine device noted in retroverted uterus. No large adnexal mass. Other: There is a tiny fat containing umbilical hernia. The soft tissues and abdominal wall are otherwise unremarkable. Musculoskeletal: No suspicious osseous lesions. IMPRESSION: 1. Mild right hydronephrosis and hydroureter with diffuse hypoattenuation of the right kidney in comparison  to the left. No ureteric calculus or focal mass is seen. Findings may represent recent passage of right ureteric calculus. Correlation with urinalysis may be helpful to exclude the possibility of an underlying urinary tract infection. 2. No left nephroureterolithiasis or obstructive uropathy. 3. Multiple other nonacute observations, as described above. Electronically Signed   By: Jules Schick M.D.   On: 11/03/2022 11:02    Procedures Procedures    Medications Ordered in ED Medications  ketorolac (TORADOL) 15 MG/ML injection 15 mg (15 mg Intravenous Given 11/03/22 0942)  lactated ringers bolus 1,000 mL (0 mLs Intravenous Stopped 11/03/22 1216)  oxyCODONE-acetaminophen (PERCOCET/ROXICET) 5-325 MG per tablet 1 tablet (1 tablet Oral Given 11/03/22 1040)  cefTRIAXone (ROCEPHIN) 1 g in sodium chloride 0.9 % 100 mL IVPB (0 g Intravenous Stopped 11/03/22 1237)    ED Course/ Medical Decision Making/ A&P Clinical Course as of 11/03/22 1529  Fri Nov 03, 2022  1257 Patient's workup reassuring.  Has evidence of UTI.  BMP with no kidney injury.  No leukocytosis to suggest systemic infection.  Pain control with Toradol and Percocet.  She is tolerating p.o. here in the emergency department.  Will treat for pyelonephritis and discharged with antibiotics.  Patient agreeable to plan.  Stable for discharge at this time. [TY]    Clinical Course User Index [TY] Coral Spikes, DO                                 Medical Decision Making Well-appearing 42 year old female with comorbid medical conditions to include obesity and history of kidney stones presenting emergency department for right flank pain.  She is afebrile nontachycardic, slightly hypertensive.  Physical exam with some minor right CVA tenderness.  Workup today showed some evidence of UTI.  Given Rocephin here in the emergency department.  No leukocytosis fever or tachycardia to suggest systemic infection.  CT scan today with mild hydronephrosis, but no  stones or mass identified.  Possible recent passage of stone versus [place].  Was given Toradol and Percocet for pain.  Will reevaluate for possible discharge.  See MDM for final disposition  Amount and/or Complexity of Data Reviewed Labs: ordered. Radiology: ordered.  Risk Prescription drug management.          Final Clinical Impression(s) / ED Diagnoses Final diagnoses:  Pyelonephritis    Rx / DC Orders ED Discharge Orders          Ordered  cephALEXin (KEFLEX) 500 MG capsule  4 times daily        11/03/22 1302    oxyCODONE-acetaminophen (PERCOCET/ROXICET) 5-325 MG tablet  Every 6 hours PRN        11/03/22 1302    ondansetron (ZOFRAN) 4 MG tablet  Every 6 hours        11/03/22 1302              Coral Spikes, DO 11/03/22 1529

## 2022-11-04 LAB — URINE CULTURE

## 2022-11-06 ENCOUNTER — Other Ambulatory Visit: Payer: Self-pay | Admitting: Family Medicine

## 2022-11-06 ENCOUNTER — Telehealth: Payer: Self-pay

## 2022-11-06 NOTE — Telephone Encounter (Signed)
Patient is made aware and states that she would rather see her PCP. Patient states she did not want to scheduled a follow up appointment. Patient voiced understanding.

## 2022-11-06 NOTE — Telephone Encounter (Signed)
-----   Message from Bertram Millard Dahlstedt sent at 11/06/2022  4:02 PM EDT ----- Patient.  She did have a urinary tract infection at visit last week.  I saw where she went back to the emergency room.  They gave her a proper antibiotic for the UTI.  She should come back within the next month to see either me or Sarah. ----- Message ----- From: Interface, [facility] Lab Results In Sent: 10/31/2022   3:36 PM EDT To: Marcine Matar, MD

## 2022-11-21 ENCOUNTER — Encounter: Payer: Self-pay | Admitting: Family Medicine

## 2022-12-19 ENCOUNTER — Other Ambulatory Visit (HOSPITAL_COMMUNITY): Payer: Self-pay | Admitting: Family Medicine

## 2022-12-19 DIAGNOSIS — Z1231 Encounter for screening mammogram for malignant neoplasm of breast: Secondary | ICD-10-CM

## 2022-12-22 ENCOUNTER — Ambulatory Visit: Payer: BC Managed Care – PPO | Admitting: Family Medicine

## 2022-12-22 VITALS — BP 128/74 | HR 95 | Ht 64.0 in | Wt 275.4 lb

## 2022-12-22 DIAGNOSIS — G43809 Other migraine, not intractable, without status migrainosus: Secondary | ICD-10-CM

## 2022-12-22 DIAGNOSIS — L600 Ingrowing nail: Secondary | ICD-10-CM | POA: Diagnosis not present

## 2022-12-22 DIAGNOSIS — G4730 Sleep apnea, unspecified: Secondary | ICD-10-CM

## 2022-12-22 MED ORDER — SUMATRIPTAN SUCCINATE 50 MG PO TABS: ORAL_TABLET | ORAL | 0 refills | Status: AC

## 2022-12-22 NOTE — Progress Notes (Signed)
Subjective:    Patient ID: Brandi Dunn, female    DOB: Dec 11, 1980, 42 y.o.   MRN: [id number]  Discussed the use of AI scribe software for clinical note transcription with the patient, who gave verbal consent to proceed.  History of Present Illness   The patient, with a history of frequent headaches, presented with a severe headache episode that occurred approximately three to four weeks ago. This episode was described as the worst headache she had ever experienced, lasting for four days, during which she was unable to eat due to exacerbated pain during chewing. The headache was unresponsive to over-the-counter medications such as ibuprofen and Excedrin Migraine. The onset of the headache was gradual, but it quickly escalated to a peak intensity. The pain was localized mainly on the right side of the head. Accompanying symptoms included nausea, vomiting, and sensitivity to light and noise. The patient also reported experiencing regular headaches, occurring approximately once or twice a week, which she usually manages with Excedrin Migraine.  The patient also reported sleep disturbances, including snoring and instances of waking up in the middle of the night due to headaches. She also reported feeling tired during the day and not feeling refreshed upon waking up in the morning. The patient's spouse has observed episodes of apnea during sleep.  In addition to the headaches and sleep issues, the patient reported a persistent aching pain on the left side of her body, from the hip to the knee, which worsens when lying down. This pain has led to difficulty sleeping on the left side. The patient also reported an issue with an ingrown toenail on the right foot, causing discomfort and pain.  The patient expressed a desire to address her weight issues, acknowledging that it could be contributing to her health problems. She reported previous attempts at using weight loss medications, but these were not  successful in the long term. The patient also expressed a willingness to undergo a sleep study and to try new medications to manage her headaches.         Review of Systems     Objective:    Physical Exam   CHEST: Lungs clear to auscultation. CARDIOVASCULAR: Heart sounds normal upon auscultation. SKIN: Toenail on big toe with curve causing ingrowth into skin. No infection. Tenderness upon palpation.           Assessment & Plan:  Assessment and Plan    Chronic Headaches Frequent headaches, with a recent severe episode lasting four days, associated with nausea, vomiting, photophobia, and phonophobia. Suspected sleep apnea contributing to headache frequency and severity. Overuse of Excedrin Migraine may be causing rebound headaches. -Order sleep study to evaluate for sleep apnea. -Prescribe Imitrex for acute headache episodes. -Start Topiramate twice daily to reduce headache frequency. -Advise to limit Excedrin Migraine use to once a week.  Hip Pain Chronic left hip pain, worse when lying on the left side. Possible trochanteric bursitis. -Advise on stretching exercises and use of anti-inflammatories. -Consider referral to orthopedics for possible injection if conservative measures fail.  Ingrown Toenail Painful left big toe due to ingrown toenail, no signs of infection. -Refer to podiatry for possible partial nail avulsion.  Prediabetes Previous A1c of 5.7, currently not on medication. -Continue monitoring A1c levels.  Weight Management Patient expressed interest in weight loss medications, but current insurance does not cover for prediabetes. -Continue to monitor for eligibility for weight loss medications.      1. Sleep apnea, unspecified type Sleep study evaluation very  important will refer to Dr. Frances Furbish I believe this could well be triggering a lot of her headaches  2. Morbid obesity (HCC) Portion control regular physical activity unfortunately GLP-1 medications not  covered by her insurance and high cost precludes use  3. Ingrown toenail Referral to podiatry to have lateral resection  4. Other migraine without status migrainosus, not intractable Patient is to try to back down on Excedrin Migraine in addition to this she will try topiramate twice daily In addition to this Imitrex on a as needed basis no more than 2/day if she is not seeing any significant improvement and the how things are going over the course of the next couple months consideration for more advanced treatment of migraines Do not feel she needs MRI currently Patient is send this MyChart update in 3 to 4 weeks follow-up within 12 weeks

## 2022-12-25 ENCOUNTER — Other Ambulatory Visit: Payer: Self-pay

## 2022-12-25 DIAGNOSIS — L600 Ingrowing nail: Secondary | ICD-10-CM

## 2022-12-25 DIAGNOSIS — G4730 Sleep apnea, unspecified: Secondary | ICD-10-CM

## 2022-12-29 DIAGNOSIS — S0501XA Injury of conjunctiva and corneal abrasion without foreign body, right eye, initial encounter: Secondary | ICD-10-CM | POA: Diagnosis not present

## 2023-01-01 ENCOUNTER — Ambulatory Visit (HOSPITAL_COMMUNITY)
Admission: RE | Admit: 2023-01-01 | Discharge: 2023-01-01 | Disposition: A | Discharge status: Home / Self Care | Payer: BC Managed Care – PPO | Source: Ambulatory Visit | Attending: Family Medicine | Admitting: Family Medicine

## 2023-01-01 ENCOUNTER — Encounter (HOSPITAL_COMMUNITY): Payer: Self-pay

## 2023-01-01 DIAGNOSIS — Z1231 Encounter for screening mammogram for malignant neoplasm of breast: Secondary | ICD-10-CM | POA: Insufficient documentation

## 2023-01-08 ENCOUNTER — Ambulatory Visit: Payer: BC Managed Care – PPO | Admitting: Podiatry

## 2023-01-08 ENCOUNTER — Other Ambulatory Visit (HOSPITAL_COMMUNITY): Payer: Self-pay | Admitting: Family Medicine

## 2023-01-08 DIAGNOSIS — R928 Other abnormal and inconclusive findings on diagnostic imaging of breast: Secondary | ICD-10-CM

## 2023-01-23 ENCOUNTER — Institutional Professional Consult (permissible substitution): Payer: BC Managed Care – PPO | Admitting: Neurology

## 2023-01-30 ENCOUNTER — Encounter: Payer: Self-pay | Admitting: Family Medicine

## 2023-01-30 ENCOUNTER — Other Ambulatory Visit: Payer: Self-pay

## 2023-01-30 NOTE — Telephone Encounter (Signed)
Nurses-I would recommend consultation with [facility] bariatric surgery with [facility] surgery.  They do a good job.  Typically they will set up [facility] with a initial overview of the various options and discussing her situation then if she is on board with all of this they will set her up specifically with physician for surgical consultation  Please go ahead with referral due to morbid obesity  You may share the upper portion of this message with the patient and let her know that a referral has been initiated thank you

## 2023-02-05 DIAGNOSIS — R3915 Urgency of urination: Secondary | ICD-10-CM | POA: Diagnosis not present

## 2023-02-08 ENCOUNTER — Ambulatory Visit (HOSPITAL_COMMUNITY)
Admission: RE | Admit: 2023-02-08 | Discharge: 2023-02-08 | Disposition: A | Discharge status: Home / Self Care | Payer: BC Managed Care – PPO | Source: Ambulatory Visit | Attending: Family Medicine | Admitting: Family Medicine

## 2023-02-08 DIAGNOSIS — R928 Other abnormal and inconclusive findings on diagnostic imaging of breast: Secondary | ICD-10-CM

## 2023-02-19 ENCOUNTER — Ambulatory Visit: Payer: BC Managed Care – PPO | Admitting: Neurology

## 2023-02-19 ENCOUNTER — Encounter: Payer: Self-pay | Admitting: Neurology

## 2023-02-19 VITALS — BP 124/64 | HR 87 | Ht 64.0 in | Wt 274.0 lb

## 2023-02-19 DIAGNOSIS — G478 Other sleep disorders: Secondary | ICD-10-CM

## 2023-02-19 DIAGNOSIS — R0689 Other abnormalities of breathing: Secondary | ICD-10-CM

## 2023-02-19 DIAGNOSIS — Z6841 Body Mass Index (BMI) 40.0 and over, adult: Secondary | ICD-10-CM

## 2023-02-19 DIAGNOSIS — Z9189 Other specified personal risk factors, not elsewhere classified: Secondary | ICD-10-CM

## 2023-02-19 DIAGNOSIS — R0683 Snoring: Secondary | ICD-10-CM | POA: Diagnosis not present

## 2023-02-19 DIAGNOSIS — R519 Headache, unspecified: Secondary | ICD-10-CM

## 2023-02-19 DIAGNOSIS — R351 Nocturia: Secondary | ICD-10-CM

## 2023-02-19 DIAGNOSIS — Z820 Family history of epilepsy and other diseases of the nervous system: Secondary | ICD-10-CM

## 2023-02-19 DIAGNOSIS — G4719 Other hypersomnia: Secondary | ICD-10-CM

## 2023-02-19 DIAGNOSIS — R0681 Apnea, not elsewhere classified: Secondary | ICD-10-CM

## 2023-02-19 NOTE — Patient Instructions (Signed)

## 2023-02-19 NOTE — Progress Notes (Signed)
 Subjective:    Patient ID: Brandi Dunn is a 43 y.o. female.  HPI    True Mar, MD, PhD [facility] [address], [address] [address] [place], [place] [address]  Dear Dr. Alphonsa,  I saw your patient, Brandi Dunn, upon your kind request 8 my sleep clinic today for initial consultation of her sleep disorder, in particular, concern for underlying obstructive sleep apnea.  The patient is unaccompanied today.  As you know, [name] is a 43 year old female with an underlying medical history of headaches, hip pain, prediabetes, reflux disease, history of kidney stones, anxiety, status post partial parathyroidectomy in 2022 and severe obesity with a BMI of over 45, who reports snoring and excessive daytime somnolence as well as witnessed apneas and occasionally waking up with a sense of gasping for air or struggling to breathe.  Her Epworth sleepiness score is 4 out of 24, fatigue severity score is 35 out of 63.  I reviewed your office note from 12/22/2022.  As she recalls, her father was diagnosed with obstructive sleep apnea years ago.  She has never had a sleep study.  She has trouble falling asleep.  Years ago she tried Ambien but it stopped working.  She wakes up with a headache occasionally.  She has nocturia about twice per average night.  She has had weight gain over time.  She is working on weight loss, has cut out sodas and currently does not drink any caffeine.  She is a non-smoker.  She drinks alcohol rarely, on special occasions.  She lives with her husband and younger son who is 82, her oldest son is in college at [employer] state.  They have a rapid in the household.  Bedtime is around 9 PM but it may take her a few hours to fall asleep.  She does have a TV on in her bedroom and turns it off around 10 or 10:30 PM.  Rise time is 5 AM.  She works as a PUBLIC HOUSE MANAGER in a doctor, hospital.  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    Her Past Surgical History Is Significant For: Past Surgical History:  Procedure Laterality Date   BIOPSY  07/05/2022   Procedure: BIOPSY;  Surgeon: Cindie Carlin POUR, DO;  [facility];  Service: Endoscopy;;   BREAST BIOPSY Left 12/02/2019   Fat necrosis with calcifications   CHOLECYSTECTOMY  04/12/2011   COLONOSCOPY WITH PROPOFOL  N/A 07/05/2022   Procedure: COLONOSCOPY WITH PROPOFOL ;  Surgeon: Cindie Carlin POUR, DO;  Location: [facility];  Service: Endoscopy;  Laterality: N/A;  730am, asa 3   CYSTOSCOPY W/ URETERAL STENT PLACEMENT Left 09/10/2012   Procedure: CYSTOSCOPY WITH LEFT RETROGRADE PYELOGRAM; LEFT URETERAL STENT PLACEMENT;  Surgeon: Emery LILLETTE Blaze, MD;  Location: [facility];  Service: Urology;  Laterality: Left;   DILATION AND CURETTAGE OF UTERUS  2000?   x2, [facility]   ESOPHAGOGASTRODUODENOSCOPY (EGD) WITH PROPOFOL  N/A 07/05/2022   Procedure: ESOPHAGOGASTRODUODENOSCOPY (EGD) WITH PROPOFOL ;  Surgeon: Cindie Carlin POUR, DO;  [facility]: [facility];  Service: Endoscopy;  Laterality: N/A;   PARATHYROIDECTOMY Left 04/08/2020   Procedure: LEFT INFERIOR PARATHYROIDECTOMY;  Surgeon: Eletha Boas, MD;  Location: [facility];  Service: General;  Laterality: Left;  LDOW ROOM 4  90 MIN   STONE EXTRACTION WITH BASKET Left 09/10/2012   Procedure: BALLOON DILATATION LEFT URETER; LEFT URETEROSCOPIC STONE EXTRACTION WITH BASKET;  Surgeon: Emery LILLETTE Blaze, MD;  Location: [facility];  Service: Urology;  Laterality: Left;    Her Family History Is Significant For: Family History  Problem Relation Age of Onset   Hypertension Mother    Hyperlipidemia Mother    Hypertension Father    Anesthesia problems Neg Hx    Hypotension Neg Hx    Malignant hyperthermia Neg Hx    Pseudochol deficiency  Neg Hx    Colon cancer Neg Hx    Colon polyps Neg Hx    Inflammatory bowel disease Neg Hx     Her Social History Is Significant For: Social History   Socioeconomic History   Marital status: Married    Spouse name: Not on file   Number of children: 2   Years of education: Not on file   Highest education level: Some college, no degree  Occupational History   Occupation: [employer]  Tobacco Use   Smoking status: Never   Smokeless tobacco: Never  Vaping Use   Vaping status: Never Used  Substance and Sexual Activity   Alcohol use: No    Alcohol/week: 0.0 standard drinks of alcohol   Drug use: No   Sexual activity: Yes    Birth control/protection: I.U.D.  Other Topics Concern   Not on file  Social History Narrative   Right handed   Caffeine: none in 10 days   Social Drivers of Health   Financial Resource Strain: Low Risk  (12/21/2022)   Overall Financial Resource Strain (CARDIA)    Difficulty of Paying Living Expenses: Not very hard  Food Insecurity: Low Risk  (02/05/2023)   Received from [facility]   Hunger Vital Sign    Worried About Running Out of Food in the Last Year: Never true    Ran Out of Food in the Last Year: Never true  Transportation Needs: No Transportation Needs (02/05/2023)   Received from [facility]    In the past 12 months, has lack of reliable transportation kept you from medical appointments, meetings, work or from getting things needed for daily living? : No  Physical Activity: Insufficiently Active (12/21/2022)   Exercise Vital Sign    Days of Exercise per Week: 3 days    Minutes of Exercise per Session: 20 min  Stress: No Stress Concern Present (12/21/2022)   [employer] of Occupational Health - Occupational Stress Questionnaire    Feeling of Stress : Only a little  Social Connections: Socially Integrated (12/21/2022)   Social Connection and Isolation Panel [NHANES]    Frequency of Communication with Friends and  Family: More than three times a week    Frequency of Social Gatherings with Friends and Family: Twice a week    Attends Religious Services: More than 4 times per year    Active Member of [employer] or Organizations: Yes    Attends Engineer, Structural: More than 4 times per year    Marital Status: Married    Her Allergies Are:  No Known Allergies:   Her Current Medications Are:  Outpatient Encounter Medications as of 02/19/2023  Medication Sig   aspirin-acetaminophen -caffeine (EXCEDRIN MIGRAINE) 250-250-65 MG tablet Take 2 tablets by mouth 2 (two) times daily as needed for headache or migraine.   famotidine  (PEPCID ) 20 MG tablet Take 1 tablet (20 mg  total) by mouth every evening. (Patient taking differently: Take 20 mg by mouth every evening. As needed)   levonorgestrel  (MIRENA ) 20 MCG/24HR IUD 1 each by Intrauterine route once.   omeprazole  (PRILOSEC) 40 MG capsule Take 1 capsule (40 mg total) by mouth daily.   SUMAtriptan  (IMITREX ) 50 MG tablet May take 1 in the first sign of headache may repeat 2 hours later if necessary maximum 2/day   Vitamin D , Ergocalciferol , (DRISDOL ) 1.25 MG (50000 UNIT) CAPS capsule Take 1 capsule (50,000 Units total) by mouth every 7 (seven) days. (Patient taking differently: Take 50,000 Units by mouth every Tuesday.)   busPIRone  (BUSPAR ) 10 MG tablet 1 bid prn (Patient not taking: Reported on 02/19/2023)   dicyclomine  (BENTYL ) 20 MG tablet Take 1 tablet (20 mg total) by mouth 3 (three) times daily as needed (IBS symptoms). (Patient not taking: Reported on 02/19/2023)   DULoxetine  (CYMBALTA ) 60 MG capsule TAKE 1 CAPSULE(60 MG) BY MOUTH DAILY. STOP SERTRALINE  (Patient not taking: Reported on 02/19/2023)   fluticasone (FLONASE) 50 MCG/ACT nasal spray Place 2 sprays into both nostrils daily as needed for allergies. (Patient not taking: Reported on 02/19/2023)   ondansetron  (ZOFRAN ) 4 MG tablet Take 1 tablet (4 mg total) by mouth every 6 (six) hours. (Patient not  taking: Reported on 02/19/2023)   oxyCODONE  (ROXICODONE ) 5 MG immediate release tablet Take 1 tablet (5 mg total) by mouth every 6 (six) hours as needed for severe pain. (Patient not taking: Reported on 02/19/2023)   No facility-administered encounter medications on file as of 02/19/2023.  :   Review of Systems:  Out of a complete 14 point review of systems, all are reviewed and negative with the exception of these symptoms as listed below:  Review of Systems  Neurological:        Patient is here alone for sleep consult. She states she has had issues for years. She doesn't feel like she gets enough sleep during the night. She has a hard time with fatigue in the mornings until she gets her day started. She wakes up at night. Her husband tells her that she snores and stops breathing in her sleep. She has never had a sleep study. She thinks her father may have sleep apnea. ESS  4 FSS 35    Objective:  Neurological Exam  Physical Exam Physical Examination:   Vitals:   02/19/23 1526  BP: 124/64  Pulse: 87    General Examination: The patient is a very pleasant 43 y.o. female in no acute distress. She appears well-developed and well-nourished and well groomed.   HEENT: Normocephalic, atraumatic, pupils are equal, round and reactive to light, extraocular tracking is good without limitation to gaze excursion or nystagmus noted. Hearing is grossly intact. Face is symmetric with normal facial animation. Speech is clear with no dysarthria noted. There is no hypophonia. There is no lip, neck/head, jaw or voice tremor. Neck is supple with full range of passive and active motion. There are no carotid bruits on auscultation. Oropharynx exam reveals: mild mouth dryness, good dental hygiene and moderate airway crowding, due to small airway entry, tonsillar size of 1-2+ on the right and 1+ on the left, Mallampati class III, neck circumference 16 three-quarter inches, minimal to mild overbite noted.  Tongue  protrudes centrally and palate elevates symmetrically.  Chest: Clear to auscultation without wheezing, rhonchi or crackles noted.  Heart: S1+S2+0, regular and normal without murmurs, rubs or gallops noted.   Abdomen: Soft, non-tender and non-distended.  Extremities: There  is no pitting edema in the distal lower extremities bilaterally.   Skin: Warm and dry without trophic changes noted.   Musculoskeletal: exam reveals no obvious joint deformities.   Neurologically:  Mental status: The patient is awake, alert and oriented in all 4 spheres. Her immediate and remote memory, attention, language skills and fund of knowledge are appropriate. There is no evidence of aphasia, agnosia, apraxia or anomia. Speech is clear with normal prosody and enunciation. Thought process is linear. Mood is normal and affect is normal.  Cranial nerves II - XII are as described above under HEENT exam.  Motor exam: Normal bulk, strength and tone is noted. There is no obvious action or resting tremor.  Fine motor skills and coordination: grossly intact.  Cerebellar testing: No dysmetria or intention tremor. There is no truncal or gait ataxia.  Sensory exam: intact to light touch in the upper and lower extremities.  Gait, station and balance: She stands easily. No veering to one side is noted. No leaning to one side is noted. Posture is age-appropriate and stance is narrow based. Gait shows normal stride length and normal pace. No problems turning are noted.   Assessment and Plan:  In summary, ROYALTY FAKHOURI is a very pleasant 43 y.o.-year old female with an underlying medical history of headaches, hip pain, prediabetes, reflux disease, history of kidney stones, anxiety, status post partial parathyroidectomy in 2022 and severe obesity with a BMI of over 45, whose history and physical exam are concerning for sleep disordered breathing, particularly obstructive sleep apnea (OSA). A laboratory attended sleep study is  typically considered gold standard for evaluation of sleep disordered breathing.   I had a long chat with the patient about my findings and the diagnosis of sleep apnea, particularly OSA, its prognosis and treatment options. We talked about medical/conservative treatments, surgical interventions and non-pharmacological approaches for symptom control. I explained, in particular, the risks and ramifications of untreated moderate to severe OSA, especially with respect to developing cardiovascular disease down the road, including congestive heart failure (CHF), difficult to treat hypertension, cardiac arrhythmias (particularly A-fib), neurovascular complications including TIA, stroke and dementia. Even type 2 diabetes has, in part, been linked to untreated OSA. Symptoms of untreated OSA may include (but may not be limited to) daytime sleepiness, nocturia (i.e. frequent nighttime urination), memory problems, mood irritability and suboptimally controlled or worsening mood disorder such as depression and/or anxiety, lack of energy, lack of motivation, physical discomfort, as well as recurrent headaches, especially morning or nocturnal headaches. We talked about the importance of maintaining a healthy lifestyle and striving for healthy weight. In addition, we talked about the importance of striving for and maintaining good sleep hygiene. I recommended a sleep study at this time. I outlined the differences between a laboratory attended sleep study which is considered more comprehensive and accurate over the option of a home sleep test (HST); the latter may lead to underestimation of sleep disordered breathing in some instances and does not help with diagnosing upper airway resistance syndrome and is not accurate enough to diagnose primary central sleep apnea typically. I outlined possible surgical and non-surgical treatment options of OSA, including the use of a positive airway pressure (PAP) device (i.e. CPAP,  AutoPAP/APAP or BiPAP in certain circumstances), a custom-made dental device (aka oral appliance, which would require a referral to a specialist dentist or orthodontist typically, and is generally speaking not considered for patients with full dentures or edentulous state), upper airway surgical options, such as traditional UPPP (  which is not considered a first-line treatment) or the Inspire device (hypoglossal nerve stimulator, which would involve a referral for consultation with an ENT surgeon, after careful selection, following inclusion criteria - also not first-line treatment). I explained the PAP treatment option to the patient in detail, as this is generally considered first-line treatment.  The patient indicated that she would be willing to try PAP therapy, if the need arises. I explained the importance of being compliant with PAP treatment, not only for insurance purposes but primarily to improve patient's symptoms symptoms, and for the patient's long term health benefit, including to reduce Her cardiovascular risks longer-term.    We will pick up our discussion about the next steps and treatment options after testing.  We will keep her posted as to the test results by phone call and/or MyChart messaging where possible.  We will plan to follow-up in sleep clinic accordingly as well.  I answered all her questions today and the patient was in agreement.   I encouraged her to call with any interim questions, concerns, problems or updates or email us  through MyChart.  Generally speaking, sleep test authorizations may take up to 2 weeks, sometimes less, sometimes longer, the patient is encouraged to get in touch with us  if they do not hear back from the sleep lab staff directly within the next 2 weeks.  Thank you very much for allowing me to participate in the care of this nice patient. If I can be of any further assistance to you please do not hesitate to call me at [phone].  Sincerely,   True Mar, MD, PhD

## 2023-02-20 ENCOUNTER — Other Ambulatory Visit (HOSPITAL_COMMUNITY): Payer: BC Managed Care – PPO

## 2023-02-20 ENCOUNTER — Encounter (HOSPITAL_COMMUNITY): Payer: BC Managed Care – PPO

## 2023-03-01 ENCOUNTER — Telehealth: Payer: Self-pay | Admitting: Neurology

## 2023-03-01 NOTE — Telephone Encounter (Signed)
[employer] pending faxed form

## 2023-03-05 NOTE — Telephone Encounter (Signed)
[employer] Berkley Harvey: [id number] (exp. 03/01/23 to 06/06/23)

## 2023-03-06 NOTE — Telephone Encounter (Signed)
spoke w/pt she states wcb to schedule

## 2023-03-26 ENCOUNTER — Ambulatory Visit: Payer: BC Managed Care – PPO | Admitting: Advanced Practice Midwife

## 2023-04-02 ENCOUNTER — Encounter: Payer: Self-pay | Admitting: Family Medicine

## 2023-04-02 ENCOUNTER — Ambulatory Visit: Payer: BC Managed Care – PPO | Admitting: Physician Assistant

## 2023-04-02 ENCOUNTER — Encounter: Payer: Self-pay | Admitting: Physician Assistant

## 2023-04-02 VITALS — BP 114/77 | HR 73 | Temp 98.4°F | Ht 64.0 in | Wt 273.8 lb

## 2023-04-02 DIAGNOSIS — R109 Unspecified abdominal pain: Secondary | ICD-10-CM

## 2023-04-02 DIAGNOSIS — Z87442 Personal history of urinary calculi: Secondary | ICD-10-CM

## 2023-04-02 DIAGNOSIS — R300 Dysuria: Secondary | ICD-10-CM | POA: Diagnosis not present

## 2023-04-02 LAB — POCT URINALYSIS DIP (CLINITEK)
Bilirubin, UA: NEGATIVE
Glucose, UA: NEGATIVE mg/dL
Ketones, POC UA: NEGATIVE mg/dL
Leukocytes, UA: NEGATIVE
Nitrite, UA: NEGATIVE
POC PROTEIN,UA: NEGATIVE
Spec Grav, UA: 1.02 (ref 1.010–1.025)
Urobilinogen, UA: 0.2 U/dL
pH, UA: 6.5 (ref 5.0–8.0)

## 2023-04-02 LAB — POCT URINE PREGNANCY: Preg Test, Ur: NEGATIVE

## 2023-04-02 MED ORDER — KETOROLAC TROMETHAMINE 10 MG PO TABS
10.0000 mg | ORAL_TABLET | Freq: Four times a day (QID) | ORAL | 0 refills | Status: DC | PRN
Start: 2023-04-02 — End: 2023-04-30

## 2023-04-02 NOTE — Progress Notes (Signed)
 Acute Office Visit  Subjective:     Patient ID: Brandi Dunn, female    DOB: [birth year], 43 y.o.   MRN: [id number]   HPI Patient is in today for right flank pain. She reports symptoms began 3 weeks ago with lower back pain, but recently pain has moved to her right flank/RLQ region. She reports history of kidney stones and UTIs. She denies fevers or N/V. She has been taking ibuprofen/tylenol and home with little improvement in symptoms.   Review of Systems  Constitutional:  Negative for chills, fever and malaise/fatigue.  Gastrointestinal:  Negative for abdominal pain, nausea and vomiting.  Genitourinary:  Positive for flank pain. Negative for dysuria, frequency and urgency.        Objective:     BP 114/77   Pulse 73   Temp 98.4 F (36.9 C)   Ht 5\' 4"  (1.626 m)   Wt 273 lb 12.8 oz (124.2 kg)   SpO2 100%   BMI 47.00 kg/m   Physical Exam Vitals reviewed.  Constitutional:      General: She is not in acute distress.    Appearance: Normal appearance.  HENT:     Nose: Nose normal.     Mouth/Throat:     Mouth: Mucous membranes are moist.     Pharynx: Oropharynx is clear.  Eyes:     Extraocular Movements: Extraocular movements intact.     Conjunctiva/sclera: Conjunctivae normal.  Cardiovascular:     Rate and Rhythm: Normal rate and regular rhythm.     Heart sounds: No murmur heard. Pulmonary:     Effort: Pulmonary effort is normal.     Breath sounds: Normal breath sounds.  Abdominal:     General: Abdomen is flat.     Palpations: Abdomen is soft.     Tenderness: There is no abdominal tenderness. There is no right CVA tenderness or left CVA tenderness.  Musculoskeletal:        General: Normal range of motion.     Lumbar back: No spasms or tenderness.  Lymphadenopathy:     Cervical: No cervical adenopathy.  Skin:    General: Skin is warm and dry.     Capillary Refill: Capillary refill takes less than 2 seconds.  Neurological:     General: No focal deficit  present.     Mental Status: She is alert and oriented to person, place, and time.  Psychiatric:        Mood and Affect: Mood normal.        Behavior: Behavior normal.     Results for orders placed or performed in visit on 04/02/23  POCT URINALYSIS DIP (CLINITEK)  Result Value Ref Range   Color, UA yellow yellow   Clarity, UA clear clear   Glucose, UA negative negative mg/dL   Bilirubin, UA negative negative   Ketones, POC UA negative negative mg/dL   Spec Grav, UA 4.098 1.191 - 1.025   Blood, UA moderate (A) negative   pH, UA 6.5 5.0 - 8.0   POC PROTEIN,UA negative negative, trace   Urobilinogen, UA 0.2 0.2 or 1.0 E.U./dL   Nitrite, UA Negative Negative   Leukocytes, UA Negative Negative  POCT urine pregnancy  Result Value Ref Range   Preg Test, Ur Negative Negative        Assessment & Plan:  Right flank pain -     Ketorolac Tromethamine; Take 1 tablet (10 mg total) by mouth every 6 (six) hours as needed.  Dispense: 20 tablet;  Refill: 0 -     CT RENAL STONE STUDY -     POCT urine pregnancy  History of nephrolithiasis  Dysuria -     POCT URINALYSIS DIP (CLINITEK)   Patient stable today. Negative exam, no CVA tenderness, no tenderness to lumbar region, no suprapubic tenderness. Urine dipstick positive for blood. UPT negative in office. Doubt pyelonephritis today as there is a lack of systemic or urinary symptoms, urine was clean today, no signs of pyuria. With history of nephrolithiasis will do stat CT scan for stone workup. Toradol ordered for pain relief. Pending CT results will start Flomax if stone is found. Patient to return to clinic or seek care in ER for worsening/non intractable pain, nausea, vomiting, or fevers. Patient agreeable to plan.   Return if symptoms worsen or fail to improve.  Toni Amend Shresta Risden, PA-C

## 2023-04-03 ENCOUNTER — Encounter: Payer: Self-pay | Admitting: Physician Assistant

## 2023-04-04 ENCOUNTER — Ambulatory Visit (HOSPITAL_COMMUNITY)
Admission: RE | Admit: 2023-04-04 | Discharge: 2023-04-04 | Disposition: A | Discharge status: Home / Self Care | Payer: BC Managed Care – PPO | Source: Ambulatory Visit | Attending: Physician Assistant | Admitting: Physician Assistant

## 2023-04-04 ENCOUNTER — Other Ambulatory Visit (HOSPITAL_COMMUNITY): Payer: BC Managed Care – PPO

## 2023-04-04 DIAGNOSIS — R109 Unspecified abdominal pain: Secondary | ICD-10-CM | POA: Insufficient documentation

## 2023-04-04 NOTE — Telephone Encounter (Signed)
 Nurses-please order the CT scan stat because a renal colic hopefully they would get her in this week Please make sure that this is placed as well as the patient informed and scheduler should call out to her to get her scheduled today or tomorrow to have that scan done thank you

## 2023-04-05 ENCOUNTER — Encounter: Payer: Self-pay | Admitting: Physician Assistant

## 2023-04-10 ENCOUNTER — Encounter: Payer: Self-pay | Admitting: Family Medicine

## 2023-04-10 NOTE — Telephone Encounter (Signed)
 Nurses May use Parafon forte, 1 tablet twice daily as needed back pain as needed, #20, home use only, also recommend a follow-up office visit either with myself Eber Jones or [place].  May end up needing to have referral to subspecialty for further evaluation depending on the results of this office visit thank you

## 2023-04-11 ENCOUNTER — Other Ambulatory Visit: Payer: Self-pay

## 2023-04-11 DIAGNOSIS — M549 Dorsalgia, unspecified: Secondary | ICD-10-CM

## 2023-04-11 MED ORDER — CHLORZOXAZONE 250 MG PO TABS: 250.0000 mg | ORAL_TABLET | Freq: Two times a day (BID) | ORAL | 0 refills | Status: DC | PRN

## 2023-04-11 NOTE — Telephone Encounter (Signed)
 Nurses Cancel Parafon forte because of backorder Recommend tizanidine 2 mg 1 taken every 8 hours as needed caution drowsiness do not drive when taking medication #15 Preferably do not take with oxycodone  Please follow-up if ongoing troubles

## 2023-04-11 NOTE — Telephone Encounter (Signed)
 Copied from CRM [id number]. Topic: Clinical - Prescription Issue >> Apr 11, 2023 12:41 PM Higinio Roger wrote: Reason for CRM: Patient states there is a back order on chlorzoxazone (PARAFON FORTE) 250 MG tablet  and would like a different medication put in. Callback #: [phone].   Preferred Pharmacy: [id number] - [place], [place] - [address] [place] [address]VHQIO: [phone] Fax: [phone]: Not open 24 hours

## 2023-04-12 ENCOUNTER — Other Ambulatory Visit: Payer: Self-pay

## 2023-04-12 DIAGNOSIS — M549 Dorsalgia, unspecified: Secondary | ICD-10-CM

## 2023-04-12 MED ORDER — TIZANIDINE HCL 2 MG PO TABS
2.0000 mg | ORAL_TABLET | Freq: Three times a day (TID) | ORAL | 0 refills | Status: DC
Start: 2023-04-12 — End: 2023-09-11

## 2023-04-12 NOTE — Progress Notes (Signed)
 Pt notified of prescription change to Zanaflex. Will pick up at pharmacy and contact office if any issues.

## 2023-04-16 ENCOUNTER — Ambulatory Visit: Admitting: Urology

## 2023-04-16 ENCOUNTER — Encounter: Payer: Self-pay | Admitting: Urology

## 2023-04-16 ENCOUNTER — Ambulatory Visit: Payer: Self-pay | Admitting: Family Medicine

## 2023-04-16 VITALS — BP 117/76 | HR 92

## 2023-04-16 DIAGNOSIS — R109 Unspecified abdominal pain: Secondary | ICD-10-CM

## 2023-04-16 DIAGNOSIS — N200 Calculus of kidney: Secondary | ICD-10-CM

## 2023-04-16 DIAGNOSIS — Z87442 Personal history of urinary calculi: Secondary | ICD-10-CM

## 2023-04-16 DIAGNOSIS — G8929 Other chronic pain: Secondary | ICD-10-CM | POA: Diagnosis not present

## 2023-04-16 NOTE — Telephone Encounter (Signed)
 Chief Complaint: Back Pain   Disposition: [] ED /[] Urgent Care (no appt availability in office) / [] Appointment(In office/virtual)/ []  [place] [facility]/ [] Home Care/ [] Refused Recommended Disposition /[] [place] [facility]/ [x]  Follow-up with PCP Additional Notes: Attempted to contact patient 3 times to triage. Unable to connect with patient. Routing to PCP practice.   Copied From CRM [id number].  Reason for Triage: back pain radiates to her side..[phone]  Reason for Disposition  Third attempt to contact caller AND no contact made. Phone number verified.  Answer Assessment - Initial Assessment Questions 3 attempts made to contact patient  Protocols used: No Contact or Duplicate Contact Call-A-AH

## 2023-04-16 NOTE — Progress Notes (Signed)
 Name: Brandi Dunn DOB: 1980/05/31 MRN: [id number]  History of Present Illness: Brandi Dunn is a 43 y.o. female who presents today for follow up visit at [facility]. - GU History: 1. Kidney stones.  At last visit with Dr. Retta Diones on 10/31/2022: - Reported right flank pain.  - Urine culture positive for Klebsiella pneumoniae; treated with Keflex. - CT stone on 11/03/2022 showed "Mild right hydronephrosis and hydroureter with diffuse hypoattenuation of the right kidney in comparison to the left. No ureteric calculus or focal mass is seen. Findings may represent recent passage of right ureteric calculus."  Since last visit: > 02/05/2023:  - Seen by Urology at [facility] for urinary urgency, nocturia, and mixed urinary incontinence.  - PVR = 8 ml.  - Urine culture positive for 60-100k E. Coli; treated with Bactrim DS.  > 04/02/2023:  - Seen by PCP for right flank pain x3 weeks.  - UA showed moderate blood, otherwise normal. No urine microscopy or urine culture performed. - CT stone on 04/04/2023 showed: 1. Bilateral punctate 2 mm nonobstructing renal calculi. 2. Left ovarian simple-appearing cyst measuring 6 cm. 3. Small hiatal hernia. - Zanaflex prescribed for musculoskeletal pain.   Today: She reports flank pain or abdominal pain. She denies fevers, nausea, or vomiting.  She denies increased urinary urgency, frequency, nocturia, dysuria, gross hematuria, hesitancy, straining to void, or sensations of incomplete emptying.   Medications: Current Outpatient Medications  Medication Sig Dispense Refill   aspirin-acetaminophen-caffeine (EXCEDRIN MIGRAINE) 250-250-65 MG tablet Take 2 tablets by mouth 2 (two) times daily as needed for headache or migraine.     busPIRone (BUSPAR) 10 MG tablet 1 bid prn (Patient not taking: Reported on 02/19/2023) 60 tablet 4   chlorzoxazone (PARAFON FORTE) 250 MG tablet Take 1 tablet (250 mg total) by mouth 2  (two) times daily as needed for up to 10 days for muscle spasms. 20 tablet 0   dicyclomine (BENTYL) 20 MG tablet Take 1 tablet (20 mg total) by mouth 3 (three) times daily as needed (IBS symptoms). (Patient not taking: Reported on 02/19/2023) 90 tablet 0   DULoxetine (CYMBALTA) 60 MG capsule TAKE 1 CAPSULE(60 MG) BY MOUTH DAILY. STOP SERTRALINE (Patient not taking: Reported on 04/16/2023) 30 capsule 5   famotidine (PEPCID) 20 MG tablet Take 1 tablet (20 mg total) by mouth every evening. (Patient taking differently: Take 20 mg by mouth every evening. As needed) 30 tablet 5   fluticasone (FLONASE) 50 MCG/ACT nasal spray Place 2 sprays into both nostrils daily as needed for allergies. (Patient not taking: Reported on 02/19/2023)     ketorolac (TORADOL) 10 MG tablet Take 1 tablet (10 mg total) by mouth every 6 (six) hours as needed. 20 tablet 0   levonorgestrel (MIRENA) 20 MCG/24HR IUD 1 each by Intrauterine route once.     omeprazole (PRILOSEC) 40 MG capsule Take 1 capsule (40 mg total) by mouth daily. 90 capsule 3   ondansetron (ZOFRAN) 4 MG tablet Take 1 tablet (4 mg total) by mouth every 6 (six) hours. (Patient not taking: Reported on 04/16/2023) 12 tablet 0   oxyCODONE (ROXICODONE) 5 MG immediate release tablet Take 1 tablet (5 mg total) by mouth every 6 (six) hours as needed for severe pain. (Patient not taking: Reported on 04/16/2023) 10 tablet 0   SUMAtriptan (IMITREX) 50 MG tablet May take 1 in the first sign of headache may repeat 2 hours later if necessary maximum 2/day 10 tablet 0  tiZANidine (ZANAFLEX) 2 MG tablet Take 1 tablet (2 mg total) by mouth 3 (three) times daily. 15 tablet 0   Vitamin D, Ergocalciferol, (DRISDOL) 1.25 MG (50000 UNIT) CAPS capsule Take 1 capsule (50,000 Units total) by mouth every 7 (seven) days. (Patient not taking: Reported on 04/16/2023) 4 capsule 2   No current facility-administered medications for this visit.    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   Past Surgical History:  Procedure Laterality Date   BIOPSY  07/05/2022   Procedure: BIOPSY;  Surgeon: Lanelle Bal, DO;  Location: [facility];  Service: Endoscopy;;   BREAST BIOPSY Left 12/02/2019   Fat necrosis with calcifications   CHOLECYSTECTOMY  04/12/2011   COLONOSCOPY WITH PROPOFOL N/A 07/05/2022   Procedure: COLONOSCOPY WITH PROPOFOL;  Surgeon: Lanelle Bal, DO;  Location: [facility];  Service: Endoscopy;  Laterality: N/A;  730am, asa 3   CYSTOSCOPY W/ URETERAL STENT PLACEMENT Left 09/10/2012   Procedure: CYSTOSCOPY WITH LEFT RETROGRADE PYELOGRAM; LEFT URETERAL STENT PLACEMENT;  Surgeon: Ky Barban, MD;  Location: [facility];  Service: Urology;  Laterality: Left;   DILATION AND CURETTAGE OF UTERUS  2000?   x2, [facility]   ESOPHAGOGASTRODUODENOSCOPY (EGD) WITH PROPOFOL N/A 07/05/2022   Procedure: ESOPHAGOGASTRODUODENOSCOPY (EGD) WITH PROPOFOL;  Surgeon: Lanelle Bal, DO;  Location: [facility];  Service: Endoscopy;  Laterality: N/A;   PARATHYROIDECTOMY Left 04/08/2020   Procedure: LEFT INFERIOR PARATHYROIDECTOMY;  Surgeon: Darnell Level, MD;  Location: [facility];  Service: General;  Laterality: Left;  LDOW ROOM 4  90 MIN   STONE EXTRACTION WITH BASKET Left 09/10/2012   Procedure: BALLOON DILATATION LEFT URETER; LEFT URETEROSCOPIC STONE EXTRACTION WITH BASKET;  Surgeon: Ky Barban, MD;  Location: [facility];  Service: Urology;  Laterality: Left;   Family History  Problem Relation Age of Onset   Hypertension Mother    Hyperlipidemia Mother    Hypertension Father    Anesthesia problems Neg Hx    Hypotension Neg Hx    Malignant hyperthermia Neg Hx    Pseudochol deficiency Neg Hx    Colon cancer Neg Hx    Colon polyps Neg Hx    Inflammatory bowel  disease Neg Hx    Social History   Socioeconomic History   Marital status: Married    Spouse name: Not on file   Number of children: 2   Years of education: Not on file   Highest education level: Associate degree: occupational, Scientist, product/process development, or vocational program  Occupational History   Occupation: [employer]  Tobacco Use   Smoking status: Never   Smokeless tobacco: Never  Vaping Use   Vaping status: Never Used  Substance and Sexual Activity   Alcohol use: No    Alcohol/week: 0.0 standard drinks of alcohol   Drug use: No   Sexual activity: Yes    Birth control/protection: I.U.D.  Other Topics Concern   Not on file  Social History Narrative   Right handed   Caffeine: none in 10 days   Social Drivers of Health   Financial Resource Strain: Low Risk  (03/30/2023)   Overall Financial Resource Strain (CARDIA)    Difficulty of Paying Living Expenses: Not hard at all  Food Insecurity: No  Food Insecurity (03/30/2023)   Hunger Vital Sign    Worried About Running Out of Food in the Last Year: Never true    Ran Out of Food in the Last Year: Never true  Transportation Needs: No Transportation Needs (03/30/2023)   PRAPARE - Administrator, Civil Service (Medical): No    Lack of Transportation (Non-Medical): No  Physical Activity: Insufficiently Active (03/30/2023)   Exercise Vital Sign    Days of Exercise per Week: 3 days    Minutes of Exercise per Session: 20 min  Stress: No Stress Concern Present (03/30/2023)   [employer] of Occupational Health - Occupational Stress Questionnaire    Feeling of Stress : Only a little  Social Connections: Socially Integrated (03/30/2023)   Social Connection and Isolation Panel [NHANES]    Frequency of Communication with Friends and Family: More than three times a week    Frequency of Social Gatherings with Friends and Family: More than three times a week    Attends Religious Services: More than 4 times per year    Active Member of  [employer] or Organizations: Yes    Attends Engineer, structural: More than 4 times per year    Marital Status: Married  Catering manager Violence: Not At Risk (06/23/2021)   Humiliation, Afraid, Rape, and Kick questionnaire    Fear of Current or Ex-Partner: No    Emotionally Abused: No    Physically Abused: No    Sexually Abused: No    SUBJECTIVE  Review of Systems Constitutional: Patient denies any unintentional weight loss or change in strength lntegumentary: Patient denies any rashes or pruritus Cardiovascular: Patient denies chest pain or syncope Respiratory: Patient denies shortness of breath Gastrointestinal: As per HPI Musculoskeletal: Patient denies muscle cramps or weakness Neurologic: Patient denies convulsions or seizures Allergic/Immunologic: Patient denies recent allergic reaction(s) Hematologic/Lymphatic: Patient denies bleeding tendencies Endocrine: Patient denies heat/cold intolerance  GU: As per HPI.  OBJECTIVE Vitals:   04/16/23 1446  BP: 117/76  Pulse: 92   There is no height or weight on file to calculate BMI.  Physical Examination Constitutional: No obvious distress; patient is non-toxic appearing  Cardiovascular: No visible lower extremity edema.  Respiratory: The patient does not have audible wheezing/stridor; respirations do not appear labored  Gastrointestinal: Abdomen non-distended Musculoskeletal: Normal ROM of UEs  Skin: No obvious rashes/open sores  Neurologic: CN 2-12 grossly intact Psychiatric: Answered questions appropriately with normal affect  Hematologic/Lymphatic/Immunologic: No obvious bruises or sites of spontaneous bleeding  UA: negative   ASSESSMENT Chronic right flank pain - Plan: Urinalysis, Routine w reflex microscopic, BLADDER SCAN AMB NON-IMAGING  Kidney stones - Plan: DG Abd 1 View  We reviewed recent imaging results; no evidence of GU etiology for her pain. Discussed suspected musculoskeletal etiology; advised  follow up with PCP and possibly a spine specialist.   Handout provided about stone prevention diet. She elected to follow up in 12 months with [facility] for stone surveillance. All questions were answered.  PLAN Advised the following: Return in about 1 year (around 04/15/2024) for KUB, UA, & f/u with Evette Georges NP.  Orders Placed This Encounter  Procedures   DG Abd 1 View    Standing Status:   Future    Expected Date:   04/15/2024    Expiration Date:   04/15/2024    Reason for Exam (SYMPTOM  OR DIAGNOSIS REQUIRED):   kidney stone    Is patient pregnant?:   Unknown (Please Explain)  Preferred imaging location?:   [facility]   Urinalysis, Routine w reflex microscopic   BLADDER SCAN AMB NON-IMAGING   It has been explained that the patient is to follow regularly with their PCP in addition to all other providers involved in their care and to follow instructions provided by these respective offices. Patient advised to contact urology clinic if any urologic-pertaining questions, concerns, new symptoms or problems arise in the interim period.  Total time spent caring for the patient today was over 30 minutes. This includes time spent on the date of the visit reviewing the patient's chart before the visit, time spent during the visit, and time spent after the visit on documentation. Over 50% of that time was spent in face-to-face time with this patient for direct counseling. E&M based on time and complexity of medical decision making.  There are no Patient Instructions on file for this visit.  Electronically signed by:  Donnita Falls, MSN, FNP-C, CUNP 04/16/2023 3:19 PM

## 2023-04-16 NOTE — Telephone Encounter (Signed)
 Patient called, unable to leave VM to return the call to the office to speak to NT.  Will try again later.  Copied From CRM [id number].  Reason for Triage: back pain radiates to her side..[id number]

## 2023-04-16 NOTE — Telephone Encounter (Signed)
 2nd attempt. Patient called, unable to leave a VM to return the call to the office to speak to NT.    Copied From CRM [id number].  Reason for Triage: back pain radiates to her side..[id number]

## 2023-04-16 NOTE — Telephone Encounter (Signed)
 Noted.

## 2023-04-17 ENCOUNTER — Other Ambulatory Visit: Payer: Self-pay | Admitting: Family Medicine

## 2023-04-17 DIAGNOSIS — M549 Dorsalgia, unspecified: Secondary | ICD-10-CM

## 2023-04-17 LAB — URINALYSIS, ROUTINE W REFLEX MICROSCOPIC
Bilirubin, UA: NEGATIVE
Glucose, UA: NEGATIVE
Ketones, UA: NEGATIVE
Leukocytes,UA: NEGATIVE
Nitrite, UA: NEGATIVE
Protein,UA: NEGATIVE
RBC, UA: NEGATIVE
Specific Gravity, UA: 1.025 (ref 1.005–1.030)
Urobilinogen, Ur: 0.2 mg/dL (ref 0.2–1.0)
pH, UA: 6 (ref 5.0–7.5)

## 2023-04-17 NOTE — Telephone Encounter (Signed)
 Reason for Disposition . [1] SEVERE abdominal pain AND [2] present > 1 hour  Answer Assessment - Initial Assessment Questions 1. ONSET: "When did the pain begin?"      3 weeks  2. LOCATION: "Where does it hurt?" (upper, mid or lower back)     Right side  3. SEVERITY: "How bad is the pain?"  (e.g., Scale 1-10; mild, moderate, or severe)   - MILD (1-3): Doesn't interfere with normal activities.    - MODERATE (4-7): Interferes with normal activities or awakens from sleep.    - SEVERE (8-10): Excruciating pain, unable to do any normal activities.      9/10 4. PATTERN: "Is the pain constant?" (e.g., yes, no; constant, intermittent)      constant 5. RADIATION: "Does the pain shoot into your legs or somewhere else?"     Lower back to right side 9. NEUROLOGIC SYMPTOMS: "Do you have any weakness, numbness, or problems with bowel/bladder control?"     no 10. OTHER SYMPTOMS: "Do you have any other symptoms?" (e.g., fever, abdomen pain, burning with urination, blood in urine)       no  Protocols used: Back Pain-A-AH

## 2023-04-17 NOTE — Telephone Encounter (Signed)
  Chief Complaint: low back pain radiating to right side Symptoms: 9/10 pain Frequency: 3 weeks Pertinent Negatives: Patient denies urinary discomfort or weakness Disposition: [] ED /[] Urgent Care (no appt availability in office) / [x] Appointment(In office/virtual)/ []  [place] Virtual [facility]/ [] Home Care/ [] Refused Recommended Disposition /[] [place] [facility]/ []  Follow-up with PCP Additional Notes:

## 2023-04-18 ENCOUNTER — Encounter: Payer: Self-pay | Admitting: Family Medicine

## 2023-04-18 ENCOUNTER — Ambulatory Visit: Admitting: Family Medicine

## 2023-04-18 ENCOUNTER — Ambulatory Visit (HOSPITAL_COMMUNITY)
Admission: RE | Admit: 2023-04-18 | Discharge: 2023-04-18 | Disposition: A | Discharge status: Home / Self Care | Source: Ambulatory Visit | Attending: Urology | Admitting: Urology

## 2023-04-18 VITALS — BP 131/81 | HR 83 | Temp 98.6°F | Ht 64.0 in | Wt 271.6 lb

## 2023-04-18 DIAGNOSIS — N200 Calculus of kidney: Secondary | ICD-10-CM | POA: Diagnosis present

## 2023-04-18 DIAGNOSIS — M545 Low back pain, unspecified: Secondary | ICD-10-CM | POA: Diagnosis present

## 2023-04-18 DIAGNOSIS — N83202 Unspecified ovarian cyst, left side: Secondary | ICD-10-CM | POA: Diagnosis not present

## 2023-04-18 DIAGNOSIS — M546 Pain in thoracic spine: Secondary | ICD-10-CM

## 2023-04-18 MED ORDER — HYDROCODONE-ACETAMINOPHEN 10-325 MG PO TABS: ORAL_TABLET | ORAL | 0 refills | Status: DC

## 2023-04-18 MED ORDER — DICLOFENAC SODIUM 75 MG PO TBEC: 75.0000 mg | DELAYED_RELEASE_TABLET | Freq: Two times a day (BID) | ORAL | 0 refills | Status: DC

## 2023-04-18 NOTE — Progress Notes (Signed)
 Subjective:    Patient ID: Brandi Dunn, female    DOB: [birth year], 43 y.o.   MRN: [id number]  Discussed the use of AI scribe software for clinical note transcription with the patient, who gave verbal consent to proceed.  History of Present Illness   The patient presents with lower back pain radiating to the side.  She experiences lower back pain that radiates to her side, described as a dull, aching sensation. The pain originates in the lower back and extends around the waistline to the side, but not to the front. It is more pronounced in the abdominal area above the hip bone. The pain is intermittent, with the back pain being constant and the side pain coming and going. Sitting in certain positions exacerbates the pain, and standing up after sitting can be particularly painful.  She has tried various treatments including Tylenol, ibuprofen, hot and cold compresses, and stretching exercises, but none have provided relief. The pain has been ongoing for about a month, starting around the end of June. She has been using ibuprofen without relief and has tried muscle relaxers, which were also ineffective.  She initially suspected a kidney issue due to past experiences with kidney stones and UTIs, but a CT scan and evaluation by her urologist ruled out kidney involvement.  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 The pain also affects her sleep, waking her up at times, although she is able to rest somewhat.  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 Her colleagues have been supportive in assisting her with tasks to help manage her condition.  No radiation of pain down the leg. The pain is more severe when getting up in the morning and after sitting for a while. No fever, chills, or other systemic symptoms.         Review of Systems     Objective:     Physical Exam   VITALS: BP- 128/56 MUSCULOSKELETAL: Lumbar spine sore on palpation. Legs slightly sore on stretching.   On physical exam has tenderness in the lumbar spine region subjective discomfort on the right side abdomen No abdominal tenderness Negative straight leg raise.  Patient does relate increased pain with laying back and sitting up No weakness     I reviewed over her CT scan I did not see any sign of any tumor growth Unfortunately did not get a good look at her spine We will do plain x-rays Then also will pursue forward with doing MRI if ongoing troubles Assessment & Plan:  Assessment and Plan    Lower Back Pain with Radiculopathy   Chronic lower back pain with radiculopathy likely due to nerve impingement. Previous CT scan ruled out kidney stones and other abdominal issues. Pain persists despite conservative measures. Symptoms typically improve over 8-10 weeks; MRI considered if no improvement.   - Recommend physical therapy to alleviate symptoms and improve function.   - Order plain x-rays of the lumbar and thoracic spine to assess for bone changes.   - Prescribe a stronger anti-inflammatory twice daily for one week.   - Prescribe pain medication as needed, cautioning against dependency.   - Advise against using muscle relaxers with pain medication.   - Schedule follow-up in six weeks to reassess condition and consider MRI if no improvement.   - Encourage her to send updates on  her condition.    Ovarian Cyst   Incidental 6 cm left ovarian cyst found on CT scan. Requires follow-up due to size. Ultrasound preferred for monitoring.   - Recommend follow-up ultrasound in June or July to monitor the cyst.   - Send a message to her gynecologist to inform her of the finding and recommend follow-up.     This is already been going on for approximately 4 weeks now we will see her back in 6 weeks If she is not dramatically better consider MRI certainly if she gets worse  consider MRI Pain medication prescribed caution drowsiness home use only use sparingly Rest up over the next few days Do not use muscle relaxer  Patient with a moderate-sized cyst on the left side.  Will notify her gynecologist.  Patient states she will contact her office and schedule up an appointment to have a ultrasound done in June or July

## 2023-04-19 ENCOUNTER — Encounter: Payer: Self-pay | Admitting: Advanced Practice Midwife

## 2023-04-23 ENCOUNTER — Other Ambulatory Visit: Payer: Self-pay | Admitting: Nurse Practitioner

## 2023-04-23 MED ORDER — PREDNISONE 20 MG PO TABS: ORAL_TABLET | ORAL | 0 refills | Status: DC

## 2023-04-27 ENCOUNTER — Emergency Department (HOSPITAL_COMMUNITY)
Admission: EM | Admit: 2023-04-27 | Discharge: 2023-04-27 | Disposition: A | Discharge status: Home / Self Care | Attending: Emergency Medicine | Admitting: Emergency Medicine

## 2023-04-27 ENCOUNTER — Encounter (HOSPITAL_COMMUNITY): Payer: Self-pay | Admitting: Emergency Medicine

## 2023-04-27 ENCOUNTER — Other Ambulatory Visit: Payer: Self-pay

## 2023-04-27 ENCOUNTER — Emergency Department (HOSPITAL_COMMUNITY)

## 2023-04-27 DIAGNOSIS — R197 Diarrhea, unspecified: Secondary | ICD-10-CM | POA: Diagnosis not present

## 2023-04-27 DIAGNOSIS — M545 Low back pain, unspecified: Secondary | ICD-10-CM | POA: Insufficient documentation

## 2023-04-27 DIAGNOSIS — R112 Nausea with vomiting, unspecified: Secondary | ICD-10-CM | POA: Diagnosis not present

## 2023-04-27 DIAGNOSIS — R1084 Generalized abdominal pain: Secondary | ICD-10-CM | POA: Insufficient documentation

## 2023-04-27 DIAGNOSIS — R109 Unspecified abdominal pain: Secondary | ICD-10-CM | POA: Diagnosis present

## 2023-04-27 LAB — COMPREHENSIVE METABOLIC PANEL
ALT: 18 U/L (ref 0–44)
AST: 15 U/L (ref 15–41)
Albumin: 3.7 g/dL (ref 3.5–5.0)
Alkaline Phosphatase: 41 U/L (ref 38–126)
Anion gap: 8 (ref 5–15)
BUN: 14 mg/dL (ref 6–20)
CO2: 26 mmol/L (ref 22–32)
Calcium: 8.9 mg/dL (ref 8.9–10.3)
Chloride: 104 mmol/L (ref 98–111)
Creatinine, Ser: 0.88 mg/dL (ref 0.44–1.00)
GFR, Estimated: >60 mL/min (ref >60)
Glucose, Bld: 100 mg/dL — ABNORMAL HIGH (ref 70–99)
Potassium: 3.6 mmol/L (ref 3.5–5.1)
Sodium: 138 mmol/L (ref 135–145)
Total Bilirubin: 0.4 mg/dL (ref 0.0–1.2)
Total Protein: 7.1 g/dL (ref 6.5–8.1)

## 2023-04-27 LAB — URINALYSIS, ROUTINE W REFLEX MICROSCOPIC
Bilirubin Urine: NEGATIVE
Glucose, UA: NEGATIVE mg/dL
Ketones, ur: NEGATIVE mg/dL
Leukocytes,Ua: NEGATIVE
Nitrite: NEGATIVE
Protein, ur: NEGATIVE mg/dL
Specific Gravity, Urine: 1.026 (ref 1.005–1.030)
pH: 5 (ref 5.0–8.0)

## 2023-04-27 LAB — PREGNANCY, URINE: Preg Test, Ur: NEGATIVE

## 2023-04-27 LAB — CBC
HCT: 40.1 % (ref 36.0–46.0)
Hemoglobin: 12.7 g/dL (ref 12.0–15.0)
MCH: 27.5 pg (ref 26.0–34.0)
MCHC: 31.7 g/dL (ref 30.0–36.0)
MCV: 87 fL (ref 80.0–100.0)
Platelets: 280 10*3/uL (ref 150–400)
RBC: 4.61 MIL/uL (ref 3.87–5.11)
RDW: 13.9 % (ref 11.5–15.5)
WBC: 11.4 10*3/uL — ABNORMAL HIGH (ref 4.0–10.5)
nRBC: 0 % (ref 0.0–0.2)

## 2023-04-27 MED ORDER — SODIUM CHLORIDE 0.9 % IV BOLUS
1000.0000 mL | Freq: Once | INTRAVENOUS | Status: AC
  Administered 2023-04-27: 1000 mL via INTRAVENOUS

## 2023-04-27 MED ORDER — ONDANSETRON HCL 4 MG/2ML IJ SOLN
4.0000 mg | Freq: Once | INTRAMUSCULAR | Status: AC
  Administered 2023-04-27: 4 mg via INTRAVENOUS
  Filled 2023-04-27: qty 2

## 2023-04-27 MED ORDER — FENTANYL CITRATE PF 50 MCG/ML IJ SOSY
50.0000 ug | PREFILLED_SYRINGE | Freq: Once | INTRAMUSCULAR | Status: AC
  Administered 2023-04-27: 50 ug via INTRAVENOUS
  Filled 2023-04-27: qty 1

## 2023-04-27 MED ORDER — IOHEXOL 300 MG/ML  SOLN
100.0000 mL | Freq: Once | INTRAMUSCULAR | Status: AC | PRN
  Administered 2023-04-27: 100 mL via INTRAVENOUS

## 2023-04-27 NOTE — Discharge Instructions (Signed)
 You were seen in the emergency department for ongoing low back pain and new generalized abdominal pain.  You had blood work urinalysis and a CAT scan of your abdomen and pelvis that did not show any obvious explanation for your symptoms.  Please drink plenty of fluids and increase your fiber intake.  Follow-up with your primary care doctor.  Return to the emergency department if any worsening or concerning symptoms

## 2023-04-27 NOTE — ED Triage Notes (Signed)
 Pt bib pov w/ c/o abdominal and flank pain. Pt has a complicated history with the recurring pain that has not had a caused identified. Pt reports seeing urology and primary, Pt has an appt scheduled with gyn for an ovarian cyst that was found. Pt reports pain is primarily in her abdomen. She has tried multiple attempts to control pain that have not been effective.

## 2023-04-27 NOTE — ED Provider Notes (Signed)
 [place] EMERGENCY DEPARTMENT AT [facility] Provider Note   CSN: [id number] Arrival date & time: 04/27/23  4098     History  Chief Complaint  Patient presents with   Abdominal Pain   Flank Pain    Brandi Dunn is a 43 y.o. female.  She is here with complaint of abdominal pain generalized associated with 1 episode of vomiting that started this morning.  She has had ongoing low back pain radiating around her right side has been going on over a month.  She seen her PCP for this and had x-rays, results not back yet.  She also has a known ovarian cyst and has a follow-up with gynecology in June.  No fevers or chills no chest pain or shortness of breath no urinary symptoms no vaginal bleeding or discharge.  She rates her abdominal pain as 9 out of 10.  The history is provided by the patient.  Abdominal Pain Pain location:  Generalized Pain quality: pressure   Pain severity:  Severe Onset quality:  Sudden Duration:  2 hours Timing:  Constant Progression:  Unchanged Chronicity:  New Context: not trauma   Relieved by:  None tried Worsened by:  Nothing Ineffective treatments:  None tried Associated symptoms: diarrhea (chronic loose), nausea and vomiting   Associated symptoms: no chills, no constipation, no cough, no fever, no hematemesis and no hematochezia        Home Medications Prior to Admission medications   Medication Sig Start Date End Date Taking? Authorizing Provider  aspirin-acetaminophen-caffeine (EXCEDRIN MIGRAINE) [phone] MG tablet Take 2 tablets by mouth 2 (two) times daily as needed for headache or migraine.    [provider]  diclofenac (VOLTAREN) 75 MG EC tablet Take 1 tablet (75 mg total) by mouth 2 (two) times daily. 04/18/23   Babs Sciara, MD  HYDROcodone-acetaminophen (NORCO) 10-325 MG tablet 1/2 to 1 q 6 hours prn pain home use only 04/18/23   Luking, Jonna Coup, MD  ketorolac (TORADOL) 10 MG tablet Take 1 tablet (10 mg total) by  mouth every 6 (six) hours as needed. 04/02/23   Grooms, Hudson, PA-C  levonorgestrel (MIRENA) 20 MCG/24HR IUD 1 each by Intrauterine route once.    [provider]  omeprazole (PRILOSEC) 40 MG capsule Take 1 capsule (40 mg total) by mouth daily. 09/27/22   Gelene Mink, NP  ondansetron (ZOFRAN) 4 MG tablet Take 1 tablet (4 mg total) by mouth every 6 (six) hours. Patient not taking: Reported on 04/16/2023 11/03/22   Coral Spikes, DO  predniSONE (DELTASONE) 20 MG tablet 3 po qd x 3 d then 2 po qd x 3 d then 1 po qd x 2 d 04/23/23   Campbell Riches, NP  SUMAtriptan (IMITREX) 50 MG tablet May take 1 in the first sign of headache may repeat 2 hours later if necessary maximum 2/day 12/22/22   Luking, Jonna Coup, MD  tiZANidine (ZANAFLEX) 2 MG tablet Take 1 tablet (2 mg total) by mouth 3 (three) times daily. 04/12/23   Babs Sciara, MD      Allergies    Patient has no known allergies.    Review of Systems   Review of Systems  Constitutional:  Negative for chills and fever.  Respiratory:  Negative for cough.   Gastrointestinal:  Positive for abdominal pain, diarrhea (chronic loose), nausea and vomiting. Negative for constipation, hematemesis and hematochezia.    Physical Exam Updated Vital Signs BP 119/74 (BP Location: Right Arm)  Pulse 83   Temp (!) 97.5 F (36.4 C) (Oral)   Resp 17   Ht 5\' 4"  (1.626 m)   Wt 122.5 kg   LMP 02/09/2023 (Exact Date)   SpO2 98%   BMI 46.35 kg/m  Physical Exam Vitals and nursing note reviewed.  Constitutional:      General: She is not in acute distress.    Appearance: Normal appearance. She is well-developed. She is obese.  HENT:     Head: Normocephalic and atraumatic.  Eyes:     Conjunctiva/sclera: Conjunctivae normal.  Cardiovascular:     Rate and Rhythm: Normal rate and regular rhythm.     Heart sounds: No murmur heard. Pulmonary:     Effort: Pulmonary effort is normal. No respiratory distress.     Breath sounds: Normal breath  sounds.  Abdominal:     Palpations: Abdomen is soft.     Tenderness: There is no abdominal tenderness. There is no guarding or rebound.  Musculoskeletal:        General: No swelling.     Cervical back: Neck supple.  Skin:    General: Skin is warm and dry.     Capillary Refill: Capillary refill takes less than 2 seconds.  Neurological:     General: No focal deficit present.     Mental Status: She is alert.     ED Results / Procedures / Treatments   Labs (all labs ordered are listed, but only abnormal results are displayed) Labs Reviewed  CBC - Abnormal; Notable for the following components:      Result Value   WBC 11.4 (*)    All other components within normal limits  COMPREHENSIVE METABOLIC PANEL - Abnormal; Notable for the following components:   Glucose, Bld 100 (*)    All other components within normal limits  URINALYSIS, ROUTINE W REFLEX MICROSCOPIC - Abnormal; Notable for the following components:   Hgb urine dipstick SMALL (*)    Bacteria, UA RARE (*)    All other components within normal limits  PREGNANCY, URINE    EKG None  Radiology CT ABDOMEN PELVIS W CONTRAST Result Date: 04/27/2023 CLINICAL DATA:  Abdominal pain, acute, nonlocalized. EXAM: CT ABDOMEN AND PELVIS WITH CONTRAST TECHNIQUE: Multidetector CT imaging of the abdomen and pelvis was performed using the standard protocol following bolus administration of intravenous contrast. RADIATION DOSE REDUCTION: This exam was performed according to the departmental dose-optimization program which includes automated exposure control, adjustment of the mA and/or kV according to patient size and/or use of iterative reconstruction technique. CONTRAST:  OMNIPAQUE IOHEXOL 300 MG/ML  SOLN COMPARISON:  CT scan abdomen and pelvis from 04/04/2023. FINDINGS: Lower chest: The lung bases are clear. No pleural effusion. The heart is normal in size. No pericardial effusion. Hepatobiliary: The liver is normal in size.  Non-cirrhotic configuration. No suspicious mass. No intrahepatic or extrahepatic bile duct dilation. Gallbladder is surgically absent. Pancreas: There is an approximately 1.1 x 1.2 cm hypoattenuating lesion along the posterosuperior aspect of the pancreatic body (series 2, image 23). The structure is grossly unchanged since the prior MRI from 04/21/2021. Continued follow-up with MRI/MRCP is recommended. Spleen: Within normal limits. No focal lesion. Adrenals/Urinary Tract: Adrenal glands are unremarkable. No suspicious renal mass. No hydronephrosis. There are 2, 1-1.5 mm nonobstructing calculi each in bilateral kidneys. No ureterolithiasis. Urinary bladder is under distended, precluding optimal assessment. However, no large mass or stones identified. No perivesical fat stranding. Stomach/Bowel: There is a tiny sliding hiatal hernia. No disproportionate dilation  of the small or large bowel loops. No evidence of abnormal bowel wall thickening or inflammatory changes. The appendix is unremarkable. Vascular/Lymphatic: No ascites or pneumoperitoneum. No abdominal or pelvic lymphadenopathy, by size criteria. No aneurysmal dilation of the major abdominal arteries. Reproductive: Normal-sized retroverted uterus noted. T-shaped intrauterine device noted. Bilateral ovaries are within normal limits. No adnexal mass. Other: There is a tiny fat containing umbilical hernia. The soft tissues and abdominal wall are otherwise unremarkable. Musculoskeletal: No suspicious osseous lesions. IMPRESSION: 1. No acute inflammatory process identified within the abdomen or pelvis. 2. There is an approximately 1.1 x 1.2 cm hypoattenuating lesion along the posterosuperior aspect of the pancreatic body, which is grossly unchanged since the prior MRI from 04/21/2021. 3. Multiple other nonacute observations, as described above. Electronically Signed   By: Jules Schick M.D.   On: 04/27/2023 08:54    Procedures Procedures    Medications  Ordered in ED Medications  fentaNYL (SUBLIMAZE) injection 50 mcg (50 mcg Intravenous Given 04/27/23 0821)  ondansetron (ZOFRAN) injection 4 mg (4 mg Intravenous Given 04/27/23 0820)  sodium chloride 0.9 % bolus 1,000 mL (0 mLs Intravenous Stopped 04/27/23 0915)  iohexol (OMNIPAQUE) 300 MG/ML solution 100 mL (100 mLs Intravenous Contrast Given 04/27/23 0102)    ED Course/ Medical Decision Making/ A&P Clinical Course as of 04/28/23 0944  Fri Apr 27, 2023  0906 Lab work showing mild elevation of white count.  CT does not show any acute findings that would explain patient's symptoms.  I reviewed this with her.  Recommended close follow-up with PCP.  Return instructions discussed [MB]    Clinical Course User Index [MB] Terrilee Files, MD                                 Medical Decision Making Amount and/or Complexity of Data Reviewed Labs: ordered. Radiology: ordered.  Risk Prescription drug management.   This patient complains of generalized abdominal pain flank pain; this involves an extensive number of treatment Options and is a complaint that carries with it a high risk of complications and morbidity. The differential includes renal colic, gastritis, peptic ulcer disease, diverticulitis, colitis, perforation  I ordered, reviewed and interpreted labs, which included CBC with mildly elevated white count, chemistries and LFTs normal, urinalysis without signs of infection, pregnancy negative I ordered medication IV fluids IV pain medication and reviewed PMP when indicated. I ordered imaging studies which included CT abdomen and pelvis and I independently    visualized and interpreted imaging which showed no acute findings Previous records obtained and reviewed in epic including recent PCP notes Social determinants considered, no significant barriers Critical Interventions: None  After the interventions stated above, I reevaluated the patient and found patient to be well-appearing  with benign abdominal exam Admission and further testing considered, no indications for admission or further workup at this time.  Recommended close follow-up with her treating team.         Final Clinical Impression(s) / ED Diagnoses Final diagnoses:  Generalized abdominal pain    Rx / DC Orders ED Discharge Orders     None         Terrilee Files, MD 04/28/23 [phone]

## 2023-04-29 ENCOUNTER — Encounter: Payer: Self-pay | Admitting: Family Medicine

## 2023-04-30 ENCOUNTER — Other Ambulatory Visit: Payer: Self-pay | Admitting: Family Medicine

## 2023-05-08 ENCOUNTER — Encounter: Payer: Self-pay | Admitting: Family Medicine

## 2023-05-08 NOTE — Telephone Encounter (Signed)
 Patient states she got the Zepbound 2.5mg  from [facility] weight loss management but they making her come every week to get the shot and she wants to start getting them here- patient scheduled office visit to discuss.

## 2023-05-21 ENCOUNTER — Ambulatory Visit (HOSPITAL_COMMUNITY)

## 2023-05-28 ENCOUNTER — Ambulatory Visit: Admitting: Family Medicine

## 2023-05-28 DIAGNOSIS — N83202 Unspecified ovarian cyst, left side: Secondary | ICD-10-CM | POA: Diagnosis not present

## 2023-05-28 DIAGNOSIS — Z87442 Personal history of urinary calculi: Secondary | ICD-10-CM | POA: Diagnosis not present

## 2023-05-28 DIAGNOSIS — K862 Cyst of pancreas: Secondary | ICD-10-CM

## 2023-05-28 DIAGNOSIS — J302 Other seasonal allergic rhinitis: Secondary | ICD-10-CM

## 2023-05-28 MED ORDER — AZELASTINE HCL 0.1 % NA SOLN: 2.0000 | Freq: Two times a day (BID) | NASAL | 12 refills | Status: DC

## 2023-05-28 MED ORDER — TIRZEPATIDE-WEIGHT MANAGEMENT 2.5 MG/0.5ML ~~LOC~~ SOAJ: 2.5000 mg | SUBCUTANEOUS | 3 refills | Status: DC

## 2023-05-28 NOTE — Progress Notes (Signed)
 Subjective:    Patient ID: Brandi Dunn, female    DOB: [birth year], 43 y.o.   MRN: [id number]  HPI Pt comes in today to discuss medication for weight loss.   Pt would also like to discuss seasonal allergies, pt has been experiencing headaches, nasal congestion and scratch throat. Pt has tried OTC medications for allergies and they do not seem to be helping. Allergies and medication reviewed.  Discussed the use of AI scribe software for clinical note transcription with the patient, who gave verbal consent to proceed.  History of Present Illness   Brandi Dunn is a 43 year old female who presents with nasal congestion and cough.  She experiences nasal congestion, a sensation of dust in her throat, and frequent coughing. There is a need to constantly clear her throat and cough up mucus, which is sometimes difficult to expel. She also experiences slight headaches and sneezing, particularly at night when her nose feels 'stocked up'. These symptoms improve in the morning after she gets moving around. She has been using over-the-counter medications, including Claritin and Sudafedrin, to manage her symptoms. Her symptoms worsen when she is outside, prompting her to wear a mask. At home, she recently started using air conditioning to reduce humidity and pollen exposure. No wheezing or tightness in her lungs, but sometimes feels mucus in her throat that is hard to clear.  She has a history of back pain, which has improved since March. She did not attend physical therapy due to scheduling issues but has been performing stretches recommended by a nurse. She reports doing these stretches three times a week.  She has been trying to lose weight through various methods, including dietary changes and exercise. She stopped drinking sodas, opting for baked foods over fried, and has been walking three times a week. Despite these efforts, she finds it difficult to lose weight, having lost only five to six  pounds over four months while following a shake diet. She has tried Weight Watchers and other meal prep apps in the past without significant success. She previously used Wegovy  (semaglutide ) and experienced some weight loss, but had to stop due to medication shortages. Her insurance covers Zepbound , and she is considering this option for weight management.  She experienced an episode of abdominal pain radiating to her lower back, which led to an emergency department visit. A CT scan revealed small kidney stones and a pancreatic cyst. She is following up with her urologist and gastroenterologist.  She also has a cyst on her ovary, with a follow-up appointment scheduled for June.      Review of Systems     Objective:   Physical Exam General-in no acute distress Eyes-no discharge Lungs-respiratory rate normal, CTA CV-no murmurs,RRR Extremities skin warm dry no edema Neuro grossly normal Behavior normal, alert Morbidly obese       Assessment & Plan:  1. Morbid obesity (HCC) (Primary) She is trying to watch her portions She is try to stay physically active She has tried numerous things in the past including Wegovy , calorie restriction, weight watchers, weekly visits with [facility] She is also been doing exercise 3-4 times per week and fitting in more activity on other days when she cannot exercise  2. History of nephrolithiasis Not giving her troubles currently  3. Pancreatic cyst This was present on a previous MRI a couple years ago and now more recently present on the CAT scan therefore we will touch base with gastroenterology to see when they  recommend for us  to relook at this area again or if they feel they need to see your  4. Cyst of left ovary Patient will be seeing gynecology near future  5. Seasonal allergies We did discuss loratadine along with Astelin  and patient will give us  feedback she denies pulmonary symptoms  Assessment and Plan    Obesity Discussed Zepbound   as a treatment option, including side effects and long-term use. Explored bariatric surgery options, noting gastric bypass offers better long-term results. - Submit prescription for Zepbound  to [facility] and initiate prior authorization. - Advise updates every 3-4 weeks on medication tolerance and weight loss. - Encourage continued dietary modifications and regular exercise. - Follow up on bariatric surgery consultation and discuss post-consultation.  Allergic Rhinitis Symptoms include nasal congestion, throat irritation, and sneezing, primarily at night. Discussed loratadine and nasal sprays for symptom control. - Prescribe Astelin  nasal spray and loratadine for daily use. - Advise on using air conditioning and changing air filters regularly. - Educate on nasal spray use: two sprays per nostril twice daily.  Kidney Stones CT scan shows small kidney stones with no current pain. - Encourage increased water  intake to prevent kidney stone movement.  Pancreatic Cyst CT scan shows a small pancreatic cyst, unchanged over time. No symptoms or complications. - Consult with Dr. Mordechai April regarding monitoring of pancreatic cyst and determine follow-up imaging schedule.  Ovarian Cyst Ovarian cyst present with follow-up scheduled. - Ensure follow-up appointment for ovarian cyst evaluation in June.

## 2023-05-28 NOTE — Patient Instructions (Signed)
 It was good seeing you today I will let you know what I find out from Dr. Mordechai April Plus also we will work hard with getting Zepbound  approved Hope you have a nice day

## 2023-06-19 ENCOUNTER — Encounter: Payer: Self-pay | Admitting: Family Medicine

## 2023-06-20 ENCOUNTER — Other Ambulatory Visit: Payer: Self-pay | Admitting: Family Medicine

## 2023-06-20 MED ORDER — TIRZEPATIDE-WEIGHT MANAGEMENT 5 MG/0.5ML ~~LOC~~ SOLN: 5.0000 mg | SUBCUTANEOUS | 0 refills | Status: DC

## 2023-06-25 ENCOUNTER — Other Ambulatory Visit (HOSPITAL_COMMUNITY): Payer: Self-pay | Admitting: Surgery

## 2023-06-26 ENCOUNTER — Ambulatory Visit: Admitting: Dietician

## 2023-06-26 ENCOUNTER — Other Ambulatory Visit: Payer: Self-pay | Admitting: Gastroenterology

## 2023-06-27 ENCOUNTER — Ambulatory Visit (INDEPENDENT_AMBULATORY_CARE_PROVIDER_SITE_OTHER): Payer: Self-pay | Admitting: Licensed Clinical Social Worker

## 2023-06-27 DIAGNOSIS — F4320 Adjustment disorder, unspecified: Secondary | ICD-10-CM | POA: Diagnosis not present

## 2023-06-27 NOTE — Progress Notes (Signed)
 Virtual Visit via Video Note  I connected with Sanaiyah M Monterrosa on 06/27/23 at  4:00 PM EDT by a video enabled telemedicine application and verified that I am speaking with the correct person using two identifiers.  Location: Patient: virtual, home, [place] Provider: virtual, home, [place]   I discussed the limitations of evaluation and management by telemedicine and the availability of in person appointments. The patient expressed understanding and agreed to proceed.   I discussed the assessment and treatment plan with the patient. The patient was provided an opportunity to ask questions and all were answered. The patient agreed with the plan and demonstrated an understanding of the instructions.   The patient was advised to call back or seek an in-person evaluation if the symptoms worsen or if the condition fails to improve as anticipated.  Lucky Sable Ahlam Piscitelli, LCSW Comprehensive Clinical Assessment (CCA) Note  06/27/2023 AMARII AMY [id number]  Chief Complaint:  Chief Complaint  Patient presents with   BARIATRIC SCREENING   Visit Diagnosis:  Encounter Diagnosis  Name Primary?   Adjustment disorder, unspecified type Yes   Disposition:  Clinician sees no significant psychological factors that would hinder the success of bariatric surgery at time of assessment. Clinician supports patient candidacy for Bariatric Surgery.   Patient reports realistic expectations post surgery, is aware of the pre and post surgical process, client reports that behavioral health diagnosis(es) are stable at time of assessment, client reports positive pre and post surgical support system, and client reports motivation to make positive change.     CCA Biopsychosocial Intake/Chief Complaint:  BARIATRIC SCREENING  Current Symptoms/Problems: KIMESHA CLAXTON  is a 43 y.o. year old adult patient reporting to [facility] for preliminary screening to determine bariatric surgery eligibility. Patient  reports that  they have tried several weight loss interventions in the past, including wegovy , zepbound , decreasing calorie intake, healthier food choices, weight watchers, increased physical activity.  Patient reports current eating patterns are grabbing food on the go, making healthier choices, limiting soda intake.   Georgi Kinsman Hearne reports current medical concerns/medical history of GI issues, kidney stones, migraines..   Patient reports negative  history of depression, anxiety, or other mental health disorders.  Georgi Kinsman Benavidez  denies SI, HI, or perceptual disturbances at time of assessment.  Patient denies substance use issues at time of assessment. Estoria Geary Radu  reports that they are motivated to make positive changes to contribute to improved wellness and is seeking bariatric weight loss surgery as an intervention to support wellness goals.   Patient Reported Schizophrenia/Schizoaffective Diagnosis in Past: No   Strengths: "I feel like I am a go getter--if i want something, i will go after it" "good team player--helping others and building up others"  Preferences: Patient reports that she wants to lose weight to stay healthy and maintain positive  Abilities: Patient reports that she is willing to make positive changes   Type of Services Patient Feels are Needed: BARIATRIC WEIGHT LOSS SURGERY   Initial Clinical Notes/Concerns: Patient reports that she has had counseling in the past and it was very helpful. Patient willing to re-enter counseling if needed.   Mental Health Symptoms Depression:  Sleep (too much or little)   Duration of Depressive symptoms: Greater than two weeks   Mania:  None   Anxiety:   None   Psychosis:  None   Duration of Psychotic symptoms: No data recorded  Trauma:  None   Obsessions:  None   Compulsions:  None  Inattention:  None   Hyperactivity/Impulsivity:  None   Oppositional/Defiant Behaviors:  None   Emotional Irregularity:  None   Other  Mood/Personality Symptoms:  No data recorded   Mental Status Exam Appearance and self-care  Stature:  Average   Weight:  Obese   Clothing:  Neat/clean   Grooming:  Normal   Cosmetic use:  Age appropriate   Posture/gait:  Normal   Motor activity:  Not Remarkable   Sensorium  Attention:  Normal   Concentration:  Normal   Orientation:  X5   Recall/memory:  Normal   Affect and Mood  Affect:  Appropriate   Mood:  Other (Comment) (within normal limits)   Relating  Eye contact:  Normal   Facial expression:  Responsive   Attitude toward examiner:  Cooperative   Thought and Language  Speech flow: Clear and Coherent   Thought content:  Appropriate to Mood and Circumstances   Preoccupation:  None   Hallucinations:  None   Organization:  goal directed; coherent  [employer] of Knowledge:  No data recorded  Intelligence:  Above Average   Abstraction:  Normal   Judgement:  Good   Reality Testing:  Realistic   Insight:  Good   Decision Making:  Normal   Social Functioning  Social Maturity:  Responsible   Social Judgement:  Normal   Stress  Stressors:  Illness (parents that are older--worry about them)   Coping Ability:  Normal   Skill Deficits:  None   Supports:  Friends/Service system; Family; Church (partner, friends, aunt and nieces)     Religion: Religion/Spirituality Are You A Religious Person?: Yes How Might This Affect Treatment?: no religious barriers to  Leisure/Recreation: Leisure / Recreation Do You Have Hobbies?: Yes Leisure and Hobbies: side by sides, horseback  Exercise/Diet: Exercise/Diet Do You Exercise?: Yes (try to exercise--walking) What Type of Exercise Do You Do?: Run/Walk How Many Times a Week Do You Exercise?: 1-3 times a week Have You Gained or Lost A Significant Amount of Weight in the Past Six Months?: No Do You Follow a Special Diet?: No (tries to make healthy choices) Do You Have Any Trouble  Sleeping?: No   CCA Employment/Education Employment/Work Situation: Employment / Work Situation Employment Situation: Employed 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?: No  Education: Education Is Patient Currently Attending School?: No Last Grade Completed: 12 Name of High School: [employer] Did Garment/textile technologist From [employer]?: Yes Did Theme park manager?: Yes What Type of College Degree Do you Have?: Some college [employer] Did You Attend Graduate School?: No Did You Have An Individualized Education Program (IIEP): No Did You Have Any Difficulty At School?: No Patient's Education Has Been Impacted by Current Illness: No   CCA Family/Childhood History Family and Relationship History: Family history Marital status: Married Number of Years Married: 7 Additional relationship information: been with husband for over 25 years--pt reports husband is a good support system for her Are you sexually active?: Yes What is your sexual orientation?: heterosexual Does patient have children?: Yes How many children?: 2 How is patient's relationship with their children?: 7 yo and 43 yo  Childhood History:  Childhood History  By whom was/is the patient raised?: Both parents Additional childhood history information: positive relationship with parents Description of patient's relationship with caregiver when they were a child: positive/stable relationship with parents Patient's description of current relationship with people who raised him/her: positive relationship with parents "I still talk to them multiple times per day" How were you disciplined when you got in trouble as  a child/adolescent?: fair Does patient have siblings?: Yes Number of Siblings: 1 Description of patient's current relationship with siblings: brother 34--good relationship w/ brother currently Did patient suffer any verbal/emotional/physical/sexual abuse as a child?: No Did patient suffer from severe childhood neglect?: No Has patient ever been sexually abused/assaulted/raped as an adolescent or adult?: No Was the patient ever a victim of a crime or a disaster?: No Witnessed domestic violence?: No Has patient been affected by domestic violence as an adult?: No  Child/Adolescent Assessment:     CCA Substance Use Alcohol/Drug Use: Alcohol / Drug Use Pain Medications: SEE MAR Prescriptions: SEE MAR Over the Counter: SEE MAR History of alcohol / drug use?: No history of alcohol / drug abuse Longest period of sobriety (when/how long): ongoing Negative Consequences of Use:  (none) Withdrawal Symptoms: None        ASAM's:  Six Dimensions of Multidimensional Assessment  Dimension 1:  Acute Intoxication and/or Withdrawal Potential:   Dimension 1:  Description of individual's past and current experiences of substance use and withdrawal: no history of use/abuse or withdrawal  Dimension 2:  Biomedical Conditions and Complications:   Dimension 2:  Description of patient's biomedical conditions and  complications: GI issues  Dimension 3:  Emotional, Behavioral, or Cognitive Conditions and Complications:  Dimension 3:  Description of emotional, behavioral, or cognitive conditions and complications: none  Dimension 4:  Readiness to Change:  Dimension 4:  Description of Readiness to Change criteria: pt reports positive motivation to change  Dimension 5:  Relapse, Continued use, or Continued Problem Potential:  Dimension 5:  Relapse, continued use, or continued problem potential critiera description: pt reports good support system  Dimension 6:  Recovery/Living Environment:  Dimension 6:   Recovery/Iiving environment criteria description: pt reports good support system  ASAM Severity Score: ASAM's Severity Rating Score: 0  ASAM Recommended Level of Treatment:     Substance use Disorder (SUD) Substance Use Disorder (SUD)  Checklist Symptoms of Substance Use:  (none)  Recommendations for Services/Supports/Treatments: Recommendations for Services/Supports/Treatments Recommendations For Services/Supports/Treatments: Individual Therapy (pt willing to do outpatient therapy as needed)  DSM5 Diagnoses: Patient Active Problem List   Diagnosis Date Noted   Chronic diarrhea 05/31/2022   Nephrolithiasis 08/17/2021   Pancreatic cyst 06/23/2021   Morbid obesity (HCC) 01/03/2021   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 Date of Interview:  06/27/23 Gender:  F   Date of Report : 06/27/23 Purpose:   Bariatric/Weight-loss Surgery (pre-operative evaluation)    Assessment Instruments:  DSM-5-TR Self-Rated Level 1 Cross-Cutting Symptom Measure--Adult Severity Measure for Generalized Anxiety Disorder--Adult EAT-26  Chief Complaint: BARIATRIC SCREENING  Client Background: Patient is a 43 year old female seeking weight loss surgery. Patient has high school education and currently  works for [employer]  Patients marital status is married.   The patient is 5 feet  4 inches tall and 266 lbs., reflecting a BMI of 45.66  classifying patient in the obese range and at further risk of co-morbid diseases.  Tobacco Use: Patient denies tobacco use.   PATIENT BEHAVIORAL ASSESSMENT  SCORES  Personal History of Mental Illness: Patient denies treatment for depression and anxiety.   Mental Status Examination: Patient was oriented x5 (person, place, situation, time, and object). Patient was appropriately groomed, and neatly dressed. Patient was alert, engaged, pleasant, and cooperative. Patient denies suicidal and homicidal ideations or any perceptual disturbances. Patient denies self-injury.   DSM-5-TR Self-Rated Level 1 Cross-Cutting Symptom Measure--Adult: 0. Patient rated all items as "0".   Severity Measure for Generalized Anxiety Disorder--Adult: Patient completed a 10-question scale. Total scores can range from 0 to 40. A raw score is calculated by summing the answer to each question, and an average total score is achieved by dividing the raw score by the number of items (e.g., 10). Patient had a total raw score of 1 out of 40 which was divided by the total number of questions answered (10) to get an average score of 1 which indicates no significant anxiety.   EAT-26: The EAT-26 is a twenty-six-question screening tool to identify symptoms of eating disorders and disordered eating. The patient scored 16 out of 26. Scores below a 20 are considered not meeting criteria for disordered eating. Patient denies inducing vomiting, or intentional meal skipping. Patient denies binge eating behaviors. Patient denies laxative abuse. Patient does not meet criteria for a DSM-V eating disorder.  Conclusion & Recommendations:   Health history and current assessment reflect that patient is suitable to be a candidate for bariatric surgery. Patient understands the procedure, the risks associated with it, and the importance of post-operative holistic care (Physical, Spiritual/Values, Relationships, and Mental/Emotional health) with access to resources for support as needed. The patient has made an informed decision to proceed with procedure. The patient is motivated and expressed understanding of the  post-surgical requirements. Patient's psychological assessment will be valid from today's date for 6 months (12/28/2023). After that date, a follow-up appointment will be needed to re-evaluate the patient's psychological status.   Clinician sees no significant psychological factors that would hinder the success of bariatric surgery at time of assessment. Clinician supports patient candidacy for Bariatric Surgery.   Shan Dare, MSW, LCSW Licensed Clinical Social [facility]     Referrals to Alternative Service(s): Referred to Alternative Service(s):   Place:   Date:   Time:    Referred to Alternative Service(s):   Place:   Date:   Time:    Referred to Alternative Service(s):   Place:   Date:   Time:    Referred to Alternative Service(s):   Place:   Date:   Time:      Collaboration of Care: Other Pt to continue care with bariatric team recommendations. Pt is open to referrals re: outpatient therapy if needed.   Patient/Guardian was advised Release of Information must be obtained prior to any record release in order to collaborate their care with an outside provider. Patient/Guardian was advised if they have not already done so to contact the registration department to sign all necessary forms in order for us  to release information regarding their care.   Consent: Patient/Guardian gives verbal consent for treatment and assignment of benefits for services provided during this visit. Patient/Guardian expressed understanding and agreed  to proceed.   Shakeena Kafer R Memory Heinrichs, LCSW

## 2023-06-27 NOTE — Progress Notes (Signed)
 1

## 2023-06-28 ENCOUNTER — Ambulatory Visit: Admitting: Family Medicine

## 2023-07-18 ENCOUNTER — Other Ambulatory Visit: Payer: Self-pay | Admitting: Advanced Practice Midwife

## 2023-07-18 DIAGNOSIS — Z8742 Personal history of other diseases of the female genital tract: Secondary | ICD-10-CM

## 2023-07-19 ENCOUNTER — Other Ambulatory Visit

## 2023-07-20 ENCOUNTER — Encounter: Payer: Self-pay | Admitting: Obstetrics & Gynecology

## 2023-07-20 ENCOUNTER — Other Ambulatory Visit: Payer: Self-pay | Admitting: Family Medicine

## 2023-07-20 ENCOUNTER — Ambulatory Visit: Admitting: Obstetrics & Gynecology

## 2023-07-20 ENCOUNTER — Ambulatory Visit

## 2023-07-20 VITALS — BP 120/82 | HR 76 | Ht 64.0 in | Wt 265.0 lb

## 2023-07-20 DIAGNOSIS — Z8742 Personal history of other diseases of the female genital tract: Secondary | ICD-10-CM

## 2023-07-20 DIAGNOSIS — N939 Abnormal uterine and vaginal bleeding, unspecified: Secondary | ICD-10-CM | POA: Diagnosis not present

## 2023-07-20 DIAGNOSIS — Z975 Presence of (intrauterine) contraceptive device: Secondary | ICD-10-CM | POA: Diagnosis not present

## 2023-07-20 NOTE — Progress Notes (Signed)
 PELVIC US  TA/TV: heterogeneous retroverted uterus with multiple fibroids,(#1) subserosal fibroid anterior fundal 2.3 x 1.5 x 2.5 cm,(#2) posterior subserosal fibroid mid/fundal 1 x .8 x 1 cm,IUD is centrally located within the endometrium,EEC 5.4 mm,unilocular hemorrhagic nabothian cyst 2.1 x 1.3 x 2.2 cm,normal ovaries,ovaries appear mobile,no free fluid,no pain during ultrasound  Chaperone Odie Benne

## 2023-07-20 NOTE — Progress Notes (Signed)
 GYN VISIT Patient name: Brandi Dunn MRN [id number]  Date of birth: 06-18-1980 Chief Complaint:   Follow-up  History of Present Illness:   Brandi Dunn is a 43 y.o. [phone] female being seen today for US  follow up.  Prior CT imaging showed ovarian cyst- pt presents today for further evaluation.  Denies pelvic or abdominal pain. CT were being performed due to kidney stones.  Only gyn concern is change in her menses- normally she will have a lighter period with mirena ; however, now it starts light then gets moderate to heavy.  Bleeding will last for 7-10 days.  While this is a change, she is overall happy with this form of contraception.    Last seen by Alois Arnt 2023 Recent pap 06/2021   MIrena - placed 2022   No LMP recorded.    Review of Systems:   Pertinent items are noted in HPI Denies fever/chills, dizziness, headaches, visual disturbances, fatigue, shortness of breath, chest pain, abdominal pain, vomiting Pertinent History Reviewed:   Past Surgical History:  Procedure Laterality Date   BIOPSY  07/05/2022   Procedure: BIOPSY;  Surgeon: Vinetta Greening, DO;  [facility];  Service: Endoscopy;;   BREAST BIOPSY Left 12/02/2019   Fat necrosis with calcifications   CHOLECYSTECTOMY  04/12/2011   COLONOSCOPY WITH PROPOFOL  N/A 07/05/2022   Procedure: COLONOSCOPY WITH PROPOFOL ;  Surgeon: Vinetta Greening, DO;  Location: [facility];  Service: Endoscopy;  Laterality: N/A;  730am, asa 3   CYSTOSCOPY W/ URETERAL STENT PLACEMENT Left 09/10/2012   Procedure: CYSTOSCOPY WITH LEFT RETROGRADE PYELOGRAM; LEFT URETERAL STENT PLACEMENT;  Surgeon: Reggie Caper, MD;  Location: [facility];  Service: Urology;  Laterality: Left;   DILATION AND CURETTAGE OF UTERUS  2000?   x2, [facility]   ESOPHAGOGASTRODUODENOSCOPY (EGD) WITH PROPOFOL  N/A 07/05/2022   Procedure: ESOPHAGOGASTRODUODENOSCOPY (EGD) WITH PROPOFOL ;  Surgeon: Vinetta Greening, DO;  [facility]: [facility];  Service:  Endoscopy;  Laterality: N/A;   PARATHYROIDECTOMY Left 04/08/2020   Procedure: LEFT INFERIOR PARATHYROIDECTOMY;  Surgeon: Oralee Billow, MD;  Location: [facility];  Service: General;  Laterality: Left;  LDOW ROOM 4  90 MIN   STONE EXTRACTION WITH BASKET Left 09/10/2012   Procedure: BALLOON DILATATION LEFT URETER; LEFT URETEROSCOPIC STONE EXTRACTION WITH BASKET;  Surgeon: Reggie Caper, MD;  Location: [facility];  Service: Urology;  Laterality: Left;    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 Physical Assessment:   Vitals:   07/20/23 1025  BP: 120/82  Pulse: 76  Weight: 265 lb (120.2 kg)  Height: 5' 4 (1.626 m)  Body mass index is 45.49 kg/m.       Physical Examination:   General appearance: alert, well appearing, and in no distress  Psych: mood appropriate, normal affect  Skin: warm & dry   Cardiovascular: normal heart rate noted  Respiratory: normal respiratory effort, no distress  Pelvic: examination not indicated  Extremities: no edema   Chaperone: N/A    US  today: heterogeneous retroverted uterus with multiple fibroids,(#1) subserosal fibroid anterior fundal 2.3 x 1.5 x 2.5 cm,(#2) posterior subserosal fibroid mid/fundal 1 x .8 x 1 cm,IUD is centrally located within the endometrium,EEC 5.4 mm,unilocular hemorrhagic nabothian cyst 2.1 x  1.3 x 2.2 cm,normal ovaries,ovaries appear mobile,no free fluid,no pain during ultrasound   Assessment & Plan:  1) h/o ovarian cyst -reviewed today's US  report, normal ovaries bilaterally -discussed small fibroids and IUD in proper location  2) IUD in place/AUB -briefly discussed alternative options -pt doing ok with current device, pt to if worsening bleeding or desires to discuss alternative options -f/u for  annual as scheduled May 2026   No orders of the defined types were placed in this encounter.   Return for May 2026 annual with Alois Arnt.   Precilla Purnell, DO Attending Obstetrician & Gynecologist, [facility] for [employer], [facility]

## 2023-07-23 ENCOUNTER — Other Ambulatory Visit: Payer: Self-pay

## 2023-07-23 ENCOUNTER — Encounter: Attending: Surgery | Admitting: Dietician

## 2023-07-23 ENCOUNTER — Encounter: Payer: Self-pay | Admitting: Dietician

## 2023-07-23 VITALS — Ht 64.0 in | Wt 265.5 lb

## 2023-07-23 DIAGNOSIS — E669 Obesity, unspecified: Secondary | ICD-10-CM | POA: Insufficient documentation

## 2023-07-23 MED ORDER — TIRZEPATIDE-WEIGHT MANAGEMENT 5 MG/0.5ML ~~LOC~~ SOLN: 5.0000 mg | SUBCUTANEOUS | 0 refills | Status: DC

## 2023-07-23 NOTE — Progress Notes (Signed)
 Nutrition Assessment for Bariatric Surgery: Pre-Surgery Behavioral and Nutrition Intervention Program   Medical Nutrition Therapy  Appt Start Time: 1117    End Time: 1215  Patient was seen on 07/23/2023 for Pre-Operative Nutrition Assessment. Purpose of todays visit  enhance perioperative outcomes along with a healthy weight maintenance   Referral stated Supervised Weight Loss (SWL) visits needed: 0  Pt completed visits.   Pt has cleared nutrition requirements.   Planned surgery: RYGB Pt expectation of surgery: no set weight, feel more comfortable and healthy  NUTRITION ASSESSMENT   Anthropometrics  Start weight at [facility]: 265.5 lbs (date: 07/23/2023)  Height: 64 in BMI: 45.57 kg/m2     Clinical   Pharmacotherapy: History of weight loss medication used: zepbound   Medical hx: GERD, obesity, zepbound  (has it but has not taken it) Medications: omeprazole ; vitamin D  Labs: vit D 17.4; A1c 5.7; LDL 108 Notable signs/symptoms: none noted Any previous deficiencies? No  Evaluation of Nutritional Deficiencies: Micronutrient Nutrition Focused Physical Exam: Hair: No issues observed Eyes: No issues observed Mouth: No issues observed Neck: No issues observed Nails: No issues observed Skin: No issues observed  Lifestyle & Dietary Hx  Pt states her stress level is not high, stating today it is a little higher.  Pt states she has cut soda back, stating she drinks only one per day. Pt states the surgeon suggested she take 5,000 international units of vit D per day. Pt states she has a history of kidney stones  Current Physical Activity Recommendations state 150 minutes per week of moderate to vigorous movement including Cardio and 1-2 days of resistance activities as well as flexibility/balance activities:  Pts current physical activity: walking 3 days per week 90 minutes and elliptical 2 days per week for 20 minutes, with 100% recommendation reached   Sleep Hygiene: duration and  quality: okay, 6 hours per night  Current Patient Perceived Stress Level as stated by pt on a scale of 1-10:  4       Stress Management Techniques: journal  According to the Dietary Guidelines for Americans Recommendation: equivalent 1.5-2 cups fruits per day, equivalent 2-3 cups vegetables per day and at least half all grains whole  Fruit servings per day (on average): 2, meeting 100% recommendation  Non-starchy vegetable servings per day (on average): 2, meeting 66-100% recommendation  Whole Grains per day (on average): 1  Number of meals missed/skipped per week out of 21: 7  24-Hr Dietary Recall First Meal: 9am.  2 boiled eggs and yogurt Snack:  Second Meal: skip or tuna or chicken salad or [place] sandwich or salad Snack: fruit or crackers or chips Third Meal: baked or grilled meat and two sides (cabbage, potatoes, peas, corn, squash) Snack: fruit or crackers or fruit snacks Beverages: water , soda (1 per day)  Alcoholic beverages per week: 0 (wine occasionally; 2 times a year)   Estimated Energy Needs Calories: 1500  NUTRITION DIAGNOSIS  Overweight/obesity ([place]-3.3) related to past poor dietary habits and physical inactivity as evidenced by patient w/ planned RYGB surgery following dietary guidelines for continued weight loss.  NUTRITION INTERVENTION  Nutrition counseling (C-1) and education (E-2) to facilitate bariatric surgery goals.  Educated pt on micronutrient deficiencies post-surgery and behavioral/dietary strategies to start in order to mitigate that risk   Behavioral and Dietary Interventions Pre-Op Goals Reviewed with the Patient Nutrition: Healthy Eating Behaviors Switch to non-caloric, non-carbonated and non-caffeinated beverages such as  water , unsweetened tea, Crystal Light and zero calorie beverages (aim for 64 oz. per  day) Cut out grazing between meals or at night  Find a protein shake you like Eat every 3-5 hours        Eliminate distractions while eating  (TV, computer, reading, driving, texting) Take 60-73 minutes to eat a meal  Decrease high sugar foods/decrease high fat/fried foods Eliminate alcoholic beverages Increase protein intake (eggs, fish, chicken, yogurt) before surgery; track protein; aim for 60 grams per day Eat non starchy vegetables 2 times a day 7 days a week Eat complex carbohydrates such as whole grains and fruits Practice not drinking with meals   Behavioral Modification: Physical Activity Increase my usual daily activity (use stairs, park farther, etc.) Engage in _______________________  activity  _______ minutes ______ times per week  Other:    _________________________________________________________________     Problem Solving I will think about my usual eating patterns and how to tweak them How can my friends and family support me Barriers to starting my changes Learn and understand appetite verses hunger   Healthy Coping Allow for ___________ activities per week to help me manage stress Reframe negative thoughts I will keep a picture of someone or something that is my inspiration & look at it daily   Monitoring  Weigh myself once a week  Measure my progress by monitoring how my clothes fit Keep a food record of what I eat and drink for the next ________ (time period) Take pictures of what I eat and drink for the next ________ (time period) Use an app to count steps/day for the next_______ (time period) Measure my progress such as increased energy and more restful sleep Monitor your acid reflux and bowel habits, are they getting better?   *Goals that are bolded indicate the pt would like to start working towards these  Handouts Provided Include  Bariatric Surgery handouts (Nutrition Visits, Pre Surgery Behavioral Change Goals, Protein Shakes Brands to Choose From, Vitamins & Mineral Supplementation)  Learning Style & Readiness for Change Teaching method utilized: Visual, Auditory, and hands on   Demonstrated degree of understanding via: Teach Back  Readiness Level: preparation Barriers to learning/adherence to lifestyle change: nothing identified   RD's Notes for Next Visit    MONITORING & EVALUATION Dietary intake, weekly physical activity, body weight, and preoperative behavioral change goals   Next Steps  Pt has completed visits. No further supervised visits required/recommended. Patient is to follow up at [facility] for pre-op class >2 weeks prior to scheduled surgery.

## 2023-07-25 ENCOUNTER — Encounter: Payer: Self-pay | Admitting: Family Medicine

## 2023-07-26 ENCOUNTER — Other Ambulatory Visit: Payer: Self-pay

## 2023-07-26 MED ORDER — TIRZEPATIDE-WEIGHT MANAGEMENT 7.5 MG/0.5ML ~~LOC~~ SOLN: 7.5000 mg | SUBCUTANEOUS | 3 refills | Status: DC

## 2023-08-17 ENCOUNTER — Ambulatory Visit (HOSPITAL_COMMUNITY)
Admission: RE | Admit: 2023-08-17 | Discharge: 2023-08-17 | Disposition: A | Discharge status: Home / Self Care | Source: Ambulatory Visit | Attending: Surgery | Admitting: Surgery

## 2023-08-17 ENCOUNTER — Encounter (HOSPITAL_COMMUNITY)
Admission: RE | Admit: 2023-08-17 | Discharge: 2023-08-17 | Disposition: A | Discharge status: Home / Self Care | Source: Ambulatory Visit | Attending: Surgery

## 2023-08-17 ENCOUNTER — Other Ambulatory Visit: Payer: Self-pay

## 2023-08-17 DIAGNOSIS — Z01818 Encounter for other preprocedural examination: Secondary | ICD-10-CM | POA: Diagnosis present

## 2023-08-30 ENCOUNTER — Encounter: Payer: Self-pay | Admitting: Family Medicine

## 2023-09-01 ENCOUNTER — Other Ambulatory Visit: Payer: Self-pay | Admitting: Nurse Practitioner

## 2023-09-01 MED ORDER — TIRZEPATIDE-WEIGHT MANAGEMENT 10 MG/0.5ML ~~LOC~~ SOLN: 10.0000 mg | SUBCUTANEOUS | 0 refills | Status: AC

## 2023-09-03 ENCOUNTER — Encounter: Attending: Surgery | Admitting: Skilled Nursing Facility1

## 2023-09-05 ENCOUNTER — Encounter: Payer: Self-pay | Admitting: Skilled Nursing Facility1

## 2023-09-05 NOTE — Progress Notes (Signed)
 Pre-Operative Nutrition Class:    Patient was seen on 09/03/2023 for Pre-Operative Bariatric Surgery [facility]    Surgery date: 09/21/2023 Surgery type: RYGB Start weight at [facility]: 265.5 Weight today: 266    The following the learning objectives were met by the patient during this course: Identify Pre-Op Dietary Goals and will begin 2 weeks pre-operatively Identify appropriate sources of fluids and proteins  State protein recommendations and appropriate sources pre and post-operatively Identify Post-Operative Dietary Goals and will follow for 2 weeks post-operatively Identify appropriate multivitamin and calcium sources Describe the need for physical activity post-operatively and will follow MD recommendations State when to call healthcare provider regarding medication questions or post-operative complications When having a diagnosis of diabetes understanding hypoglycemia symptoms and the inclusion of 1 complex carbohydrate per meal  Handouts given during class include: Pre-Op Bariatric Surgery Diet Handout Protein Shake Handout Post-Op Bariatric Surgery Nutrition Handout BELT Program Information Flyer Support Group Information Flyer [facility] Bariatric Supplements Price List  Follow-Up Plan: Patient will follow-up at [facility] 2 weeks post operatively for diet advancement per MD.

## 2023-09-06 ENCOUNTER — Ambulatory Visit: Payer: Self-pay | Admitting: Surgery

## 2023-09-12 NOTE — H&P (View-Only) (Signed)
 Brandi Dunn [id number]   Referring Provider:  Self   Subjective   Chief Complaint: Pre-op Exam (LGB and upper endo)     History of Present Illness:  Returns for follow-up regarding surgical treatment of morbid obesity.  She has completed the pathway with no barriers identified.  She started the preop diet and is doing her best to comply with that.  She has several insightful questions today.   Upper GI 7/11: Negative for hiatal hernia or reflux Chest x-ray same day negative Has been cleared by the dietitian and psychologist Lab work was notable for hemoglobin A1c of 5.7, low vitamin D  at 17.4, and she was advised to start an over-the-counter supplement which she has done    32/45: 43 year old woman with history of anxiety, GERD, kidney stones and migraine who presents for consultation regarding morbid obesity.  She has been trying to lose weight through various methods including dietary changes and exercise.  She has decreased soda intake and has transitioned to more baked than fried foods as well as started walking 3 times a week.  She has also tried weight watchers, Nutrisystem's, Slim fast, protein shakes, protein powders, calorie tracking, Blue sky, calorie restriction and other meal prep apps without significant success in the past.  Reports weight loss anywhere from 5 to 10 pounds and ultimately regained this.  Transiently used Wegovy  but had to stop due to medication shortages.  Currently considering trying Zepbound .  She is interested in treating her obesity aggressively in order to improve her quality and quantity of life as well as to prevent developing complications of obesity.  She is a mother of 2.  She does note a history of reflux.  She takes omeprazole  daily and if she misses a dose does have significant breakthrough symptoms.  Previous abdominal surgery includes cholecystectomy.  Additionally has had a breast biopsy, left inferior parathyroidectomy as well as cystoscopy  and stent/stone extractions.  Labs done on 3/21: CMP unremarkable, WBC 11.4, hemoglobin 12.7, platelets 280,  CT 04/27/2023 for acute diffuse abdominal pain was negative for any acute process but did note a 1.2 cm hypoattenuating lesion along the posterior superior aspect of the pancreatic body grossly unchanged from MRI in 2023.  Nonobstructing kidney stones.  Tiny sliding hiatal hernia.  Tiny fat-containing umbilical hernia.  She is a CMA at [employer].  Denies tobacco use, alcohol use, or significant NSAID/steroid use.  Review of Systems: A complete review of systems was obtained from the patient.  I have reviewed this information and discussed as appropriate with the patient.  See HPI as well for other ROS.   Medical History: Past Medical History:  Diagnosis Date   GERD (gastroesophageal reflux disease)     Patient Active Problem List  Diagnosis   Status post parathyroidectomy    Past Surgical History:  Procedure Laterality Date   PARATHYROIDECTOMY       No Known Allergies  Current Outpatient Medications on File Prior to Visit  Medication Sig Dispense Refill   omeprazole  (PRILOSEC) 40 MG DR capsule Take 40 mg by mouth once daily     No current facility-administered medications on file prior to visit.    Family History  Problem Relation Age of Onset   High blood pressure (Hypertension) Mother    Hyperlipidemia (Elevated cholesterol) Father    High blood pressure (Hypertension) Father      Social History   Tobacco Use  Smoking Status Never  Smokeless Tobacco Never     Social History  Socioeconomic History   Marital status: Married  Tobacco Use   Smoking status: Never   Smokeless tobacco: Never  Vaping Use   Vaping status: Never Used  Substance and Sexual Activity   Alcohol use: Not Currently   Drug use: Never   Social Drivers of Health   Financial Resource Strain: Low Risk  (03/30/2023)   Received from [facility]   Overall Financial Resource  Strain (CARDIA)    Difficulty of Paying Living Expenses: Not hard at all  Food Insecurity: No Food Insecurity (03/30/2023)   Received from [facility]   Hunger Vital Sign    Within the past 12 months, you worried that your food would run out before you got the money to buy more.: Never true    Within the past 12 months, the food you bought just didn't last and you didn't have money to get more.: Never true  Transportation Needs: No Transportation Needs (03/30/2023)   Received from [facility]    Lack of Transportation (Medical): No    Lack of Transportation (Non-Medical): No  Physical Activity: Insufficiently Active (03/30/2023)   Received from [facility]   Exercise Vital Sign    On average, how many days per week do you engage in moderate to strenuous exercise (like a brisk walk)?: 3 days    On average, how many minutes do you engage in exercise at this level?: 20 min  Stress: No Stress Concern Present (03/30/2023)   Received from [facility] - Occupational Stress Questionnaire    Feeling of Stress : Only a little  Social Connections: Socially Integrated (03/30/2023)   Received from [facility]   Social Connection and Isolation Panel    In a typical week, how many times do you talk on the phone with family, friends, or neighbors?: More than three times a week    How often do you get together with friends or relatives?: More than three times a week    How often do you attend church or religious services?: More than 4 times per year    Do you belong to any clubs or organizations such as church groups, unions, fraternal or athletic groups, or school groups?: Yes    How often do you attend meetings of the clubs or organizations you belong to?: More than 4 times per year    Are you married, widowed, divorced, separated, never married, or living with a partner?: Married  Housing Stability: Unknown (09/12/2023)   Housing  Stability Vital Sign    Homeless in the Last Year: No    Objective:    Vitals:   09/12/23 1535 09/12/23 1536  BP: 107/75   Pulse: 100   Temp: 36.6 C (97.9 F)   SpO2: 99%   Weight: (!) 120.1 kg (264 lb 12.8 oz)   Height: 162.6 cm (5' 4)   PainSc:  0-No pain    Body mass index is 45.45 kg/m.  Gen: A&Ox3, no distress  Chest: respiratory effort is normal. Neuro: no gross deficit Psych: appropriate mood and affect, normal insight/judgment intact    Assessment and Plan:  Diagnoses and all orders for this visit:  Morbid obesity (CMS/HHS-HCC)  Gastroesophageal reflux disease, unspecified whether esophagitis present  Anxiety  Kidney stones  Low back pain potentially associated with radiculopathy  Other migraine without status migrainosus, not intractable  Left ovarian cyst     She remains an excellent candidate for bariatric surgery.  Given degree of reflux described I recommend Roux-en-Y gastric bypass.  We discussed the surgery including relevant anatomy, technical aspects, the risks of bleeding, infection, pain, scarring, injury to intra-abdominal structures, staple line/anastomotic leak, bleed, stricture, or abscess, chronic abdominal pain or nausea, recurrence of hiatal hernia if repaired, marginal ulcer, internal hernia/bowel obstruction, nutritional deficiencies, DVT/PE, pneumonia, heart attack, stroke, death, failure to reach weight loss goals and weight regain, hernia.  Discussed the typical pre-, peri-, and postoperative course.  Discussed the importance of lifelong behavioral changes to combat the chronic and relapsing disease which is obesity. Will plan to proceed with Roux-en-Y gastric bypass as scheduled.SABRA Mitzie Freund MD FACS

## 2023-09-12 NOTE — Progress Notes (Signed)
 Brandi Dunn [id number]   Referring Provider:  Self   Subjective   Chief Complaint: Pre-op Exam (LGB and upper endo)     History of Present Illness:  Returns for follow-up regarding surgical treatment of morbid obesity.  She has completed the pathway with no barriers identified.  She started the preop diet and is doing her best to comply with that.  She has several insightful questions today.   Upper GI 7/11: Negative for hiatal hernia or reflux Chest x-ray same day negative Has been cleared by the dietitian and psychologist Lab work was notable for hemoglobin A1c of 5.7, low vitamin D  at 17.4, and she was advised to start an over-the-counter supplement which she has done    32/45: 43 year old woman with history of anxiety, GERD, kidney stones and migraine who presents for consultation regarding morbid obesity.  She has been trying to lose weight through various methods including dietary changes and exercise.  She has decreased soda intake and has transitioned to more baked than fried foods as well as started walking 3 times a week.  She has also tried weight watchers, Nutrisystem's, Slim fast, protein shakes, protein powders, calorie tracking, Blue sky, calorie restriction and other meal prep apps without significant success in the past.  Reports weight loss anywhere from 5 to 10 pounds and ultimately regained this.  Transiently used Wegovy  but had to stop due to medication shortages.  Currently considering trying Zepbound .  She is interested in treating her obesity aggressively in order to improve her quality and quantity of life as well as to prevent developing complications of obesity.  She is a mother of 2.  She does note a history of reflux.  She takes omeprazole  daily and if she misses a dose does have significant breakthrough symptoms.  Previous abdominal surgery includes cholecystectomy.  Additionally has had a breast biopsy, left inferior parathyroidectomy as well as cystoscopy  and stent/stone extractions.  Labs done on 3/21: CMP unremarkable, WBC 11.4, hemoglobin 12.7, platelets 280,  CT 04/27/2023 for acute diffuse abdominal pain was negative for any acute process but did note a 1.2 cm hypoattenuating lesion along the posterior superior aspect of the pancreatic body grossly unchanged from MRI in 2023.  Nonobstructing kidney stones.  Tiny sliding hiatal hernia.  Tiny fat-containing umbilical hernia.  She is a CMA at [employer].  Denies tobacco use, alcohol use, or significant NSAID/steroid use.  Review of Systems: A complete review of systems was obtained from the patient.  I have reviewed this information and discussed as appropriate with the patient.  See HPI as well for other ROS.   Medical History: Past Medical History:  Diagnosis Date   GERD (gastroesophageal reflux disease)     Patient Active Problem List  Diagnosis   Status post parathyroidectomy    Past Surgical History:  Procedure Laterality Date   PARATHYROIDECTOMY       No Known Allergies  Current Outpatient Medications on File Prior to Visit  Medication Sig Dispense Refill   omeprazole  (PRILOSEC) 40 MG DR capsule Take 40 mg by mouth once daily     No current facility-administered medications on file prior to visit.    Family History  Problem Relation Age of Onset   High blood pressure (Hypertension) Mother    Hyperlipidemia (Elevated cholesterol) Father    High blood pressure (Hypertension) Father      Social History   Tobacco Use  Smoking Status Never  Smokeless Tobacco Never     Social History  Socioeconomic History   Marital status: Married  Tobacco Use   Smoking status: Never   Smokeless tobacco: Never  Vaping Use   Vaping status: Never Used  Substance and Sexual Activity   Alcohol use: Not Currently   Drug use: Never   Social Drivers of Health   Financial Resource Strain: Low Risk  (03/30/2023)   Received from [facility]   Overall Financial Resource  Strain (CARDIA)    Difficulty of Paying Living Expenses: Not hard at all  Food Insecurity: No Food Insecurity (03/30/2023)   Received from [facility]   Hunger Vital Sign    Within the past 12 months, you worried that your food would run out before you got the money to buy more.: Never true    Within the past 12 months, the food you bought just didn't last and you didn't have money to get more.: Never true  Transportation Needs: No Transportation Needs (03/30/2023)   Received from [facility]    Lack of Transportation (Medical): No    Lack of Transportation (Non-Medical): No  Physical Activity: Insufficiently Active (03/30/2023)   Received from [facility]   Exercise Vital Sign    On average, how many days per week do you engage in moderate to strenuous exercise (like a brisk walk)?: 3 days    On average, how many minutes do you engage in exercise at this level?: 20 min  Stress: No Stress Concern Present (03/30/2023)   Received from [facility] - Occupational Stress Questionnaire    Feeling of Stress : Only a little  Social Connections: Socially Integrated (03/30/2023)   Received from [facility]   Social Connection and Isolation Panel    In a typical week, how many times do you talk on the phone with family, friends, or neighbors?: More than three times a week    How often do you get together with friends or relatives?: More than three times a week    How often do you attend church or religious services?: More than 4 times per year    Do you belong to any clubs or organizations such as church groups, unions, fraternal or athletic groups, or school groups?: Yes    How often do you attend meetings of the clubs or organizations you belong to?: More than 4 times per year    Are you married, widowed, divorced, separated, never married, or living with a partner?: Married  Housing Stability: Unknown (09/12/2023)   Housing  Stability Vital Sign    Homeless in the Last Year: No    Objective:    Vitals:   09/12/23 1535 09/12/23 1536  BP: 107/75   Pulse: 100   Temp: 36.6 C (97.9 F)   SpO2: 99%   Weight: (!) 120.1 kg (264 lb 12.8 oz)   Height: 162.6 cm (5' 4)   PainSc:  0-No pain    Body mass index is 45.45 kg/m.  Gen: A&Ox3, no distress  Chest: respiratory effort is normal. Neuro: no gross deficit Psych: appropriate mood and affect, normal insight/judgment intact    Assessment and Plan:  Diagnoses and all orders for this visit:  Morbid obesity (CMS/HHS-HCC)  Gastroesophageal reflux disease, unspecified whether esophagitis present  Anxiety  Kidney stones  Low back pain potentially associated with radiculopathy  Other migraine without status migrainosus, not intractable  Left ovarian cyst     She remains an excellent candidate for bariatric surgery.  Given degree of reflux described I recommend Roux-en-Y gastric bypass.  We discussed the surgery including relevant anatomy, technical aspects, the risks of bleeding, infection, pain, scarring, injury to intra-abdominal structures, staple line/anastomotic leak, bleed, stricture, or abscess, chronic abdominal pain or nausea, recurrence of hiatal hernia if repaired, marginal ulcer, internal hernia/bowel obstruction, nutritional deficiencies, DVT/PE, pneumonia, heart attack, stroke, death, failure to reach weight loss goals and weight regain, hernia.  Discussed the typical pre-, peri-, and postoperative course.  Discussed the importance of lifelong behavioral changes to combat the chronic and relapsing disease which is obesity. Will plan to proceed with Roux-en-Y gastric bypass as scheduled.SABRA Mitzie Freund MD FACS

## 2023-09-12 NOTE — H&P (Signed)
 Brandi Dunn [id number]   Referring Provider:  Self   Subjective   Chief Complaint: Pre-op Exam (LGB and upper endo)     History of Present Illness:  Returns for follow-up regarding surgical treatment of morbid obesity.  She has completed the pathway with no barriers identified.  She started the preop diet and is doing her best to comply with that.  She has several insightful questions today.   Upper GI 7/11: Negative for hiatal hernia or reflux Chest x-ray same day negative Has been cleared by the dietitian and psychologist Lab work was notable for hemoglobin A1c of 5.7, low vitamin D  at 17.4, and she was advised to start an over-the-counter supplement which she has done    32/45: 43 year old woman with history of anxiety, GERD, kidney stones and migraine who presents for consultation regarding morbid obesity.  She has been trying to lose weight through various methods including dietary changes and exercise.  She has decreased soda intake and has transitioned to more baked than fried foods as well as started walking 3 times a week.  She has also tried weight watchers, Nutrisystem's, Slim fast, protein shakes, protein powders, calorie tracking, Blue sky, calorie restriction and other meal prep apps without significant success in the past.  Reports weight loss anywhere from 5 to 10 pounds and ultimately regained this.  Transiently used Wegovy  but had to stop due to medication shortages.  Currently considering trying Zepbound .  She is interested in treating her obesity aggressively in order to improve her quality and quantity of life as well as to prevent developing complications of obesity.  She is a mother of 2.  She does note a history of reflux.  She takes omeprazole  daily and if she misses a dose does have significant breakthrough symptoms.  Previous abdominal surgery includes cholecystectomy.  Additionally has had a breast biopsy, left inferior parathyroidectomy as well as cystoscopy  and stent/stone extractions.  Labs done on 3/21: CMP unremarkable, WBC 11.4, hemoglobin 12.7, platelets 280,  CT 04/27/2023 for acute diffuse abdominal pain was negative for any acute process but did note a 1.2 cm hypoattenuating lesion along the posterior superior aspect of the pancreatic body grossly unchanged from MRI in 2023.  Nonobstructing kidney stones.  Tiny sliding hiatal hernia.  Tiny fat-containing umbilical hernia.  She is a CMA at [employer].  Denies tobacco use, alcohol use, or significant NSAID/steroid use.  Review of Systems: A complete review of systems was obtained from the patient.  I have reviewed this information and discussed as appropriate with the patient.  See HPI as well for other ROS.   Medical History: Past Medical History:  Diagnosis Date   GERD (gastroesophageal reflux disease)     Patient Active Problem List  Diagnosis   Status post parathyroidectomy    Past Surgical History:  Procedure Laterality Date   PARATHYROIDECTOMY       No Known Allergies  Current Outpatient Medications on File Prior to Visit  Medication Sig Dispense Refill   omeprazole  (PRILOSEC) 40 MG DR capsule Take 40 mg by mouth once daily     No current facility-administered medications on file prior to visit.    Family History  Problem Relation Age of Onset   High blood pressure (Hypertension) Mother    Hyperlipidemia (Elevated cholesterol) Father    High blood pressure (Hypertension) Father      Social History   Tobacco Use  Smoking Status Never  Smokeless Tobacco Never     Social History  Socioeconomic History   Marital status: Married  Tobacco Use   Smoking status: Never   Smokeless tobacco: Never  Vaping Use   Vaping status: Never Used  Substance and Sexual Activity   Alcohol use: Not Currently   Drug use: Never   Social Drivers of Health   Financial Resource Strain: Low Risk  (03/30/2023)   Received from [facility]   Overall Financial Resource  Strain (CARDIA)    Difficulty of Paying Living Expenses: Not hard at all  Food Insecurity: No Food Insecurity (03/30/2023)   Received from [facility]   Hunger Vital Sign    Within the past 12 months, you worried that your food would run out before you got the money to buy more.: Never true    Within the past 12 months, the food you bought just didn't last and you didn't have money to get more.: Never true  Transportation Needs: No Transportation Needs (03/30/2023)   Received from [facility] - Transportation    Lack of Transportation (Medical): No    Lack of Transportation (Non-Medical): No  Physical Activity: Insufficiently Active (03/30/2023)   Received from [facility]   Exercise Vital Sign    On average, how many days per week do you engage in moderate to strenuous exercise (like a brisk walk)?: 3 days    On average, how many minutes do you engage in exercise at this level?: 20 min  Stress: No Stress Concern Present (03/30/2023)   Received from [facility] - Occupational Stress Questionnaire    Feeling of Stress : Only a little  Social Connections: Socially Integrated (03/30/2023)   Received from [facility]   Social Connection and Isolation Panel    In a typical week, how many times do you talk on the phone with family, friends, or neighbors?: More than three times a week    How often do you get together with friends or relatives?: More than three times a week    How often do you attend church or religious services?: More than 4 times per year    Do you belong to any clubs or organizations such as church groups, unions, fraternal or athletic groups, or school groups?: Yes    How often do you attend meetings of the clubs or organizations you belong to?: More than 4 times per year    Are you married, widowed, divorced, separated, never married, or living with a partner?: Married  Housing Stability: Unknown (09/12/2023)   Housing  Stability Vital Sign    Homeless in the Last Year: No    Objective:    Vitals:   09/12/23 1535 09/12/23 1536  BP: 107/75   Pulse: 100   Temp: 36.6 C (97.9 F)   SpO2: 99%   Weight: (!) 120.1 kg (264 lb 12.8 oz)   Height: 162.6 cm (5' 4)   PainSc:  0-No pain    Body mass index is 45.45 kg/m.  Gen: A&Ox3, no distress  Chest: respiratory effort is normal. Neuro: no gross deficit Psych: appropriate mood and affect, normal insight/judgment intact    Assessment and Plan:  Diagnoses and all orders for this visit:  Morbid obesity (CMS/HHS-HCC)  Gastroesophageal reflux disease, unspecified whether esophagitis present  Anxiety  Kidney stones  Low back pain potentially associated with radiculopathy  Other migraine without status migrainosus, not intractable  Left ovarian cyst     She remains an excellent candidate for bariatric surgery.  Given degree of reflux described I recommend Roux-en-Y gastric bypass.  We discussed the surgery including relevant anatomy, technical aspects, the risks of bleeding, infection, pain, scarring, injury to intra-abdominal structures, staple line/anastomotic leak, bleed, stricture, or abscess, chronic abdominal pain or nausea, recurrence of hiatal hernia if repaired, marginal ulcer, internal hernia/bowel obstruction, nutritional deficiencies, DVT/PE, pneumonia, heart attack, stroke, death, failure to reach weight loss goals and weight regain, hernia.  Discussed the typical pre-, peri-, and postoperative course.  Discussed the importance of lifelong behavioral changes to combat the chronic and relapsing disease which is obesity. Will plan to proceed with Roux-en-Y gastric bypass as scheduled.SABRA Mitzie Freund MD FACS

## 2023-09-13 ENCOUNTER — Other Ambulatory Visit: Payer: Self-pay | Admitting: Family Medicine

## 2023-09-13 ENCOUNTER — Telehealth: Payer: Self-pay

## 2023-09-13 NOTE — Patient Instructions (Signed)
 SURGICAL WAITING ROOM VISITATION  Patients having surgery or a procedure may have no more than 2 support people in the waiting area - these visitors may rotate.    Children under the age of 48 must have an adult with them who is not the patient.  Visitors with respiratory illnesses are discouraged from visiting and should remain at home.  If the patient needs to stay at the hospital during part of their recovery, the visitor guidelines for inpatient rooms apply. Pre-op nurse will coordinate an appropriate time for 1 support person to accompany patient in pre-op.  This support person may not rotate.    Please refer to the [facility] website for the visitor guidelines for Inpatients (after your surgery is over and you are in a regular room).       Your procedure is scheduled on: 09/21/23   Report to [facility] Main Entrance    Report to admitting at  7:15 AM   Call this number if you have problems the morning of surgery [phone]   Do not eat food :After Midnight.   After Midnight you may have the following liquids until 6:30 AM DAY OF SURGERY  Water  Non-Citrus Juices (without pulp, NO RED-Apple, White grape, White cranberry) Black Coffee (NO MILK/CREAM OR CREAMERS, sugar ok)  Clear Tea (NO MILK/CREAM OR CREAMERS, sugar ok) regular and decaf                             Plain Jell-O (NO RED)                                           Fruit ices (not with fruit pulp, NO RED)                                     Popsicles (NO RED)                                                               Sports drinks like Gatorade (NO RED)                  The day of surgery:  Drink ONE (1) Pre-Surgery G2 at 6:30 AM the morning of surgery. Drink in one sitting. Do not sip.  This drink was given to you during your hospital  pre-op appointment visit. Nothing else to drink after completing the  Pre-Surgery G2.  Oral Hygiene is also important to reduce your risk of infection.                                     Remember - BRUSH YOUR TEETH THE MORNING OF SURGERY WITH YOUR REGULAR TOOTHPASTE   Stop all vitamins and herbal supplements 7 days before surgery.   Take these medicines the morning of surgery with A SIP OF WATER : Omeprazole (prilosec)  Do not take Tirzepatide  within 7 days of surgery.             You may not have any metal on your body including hair pins, jewelry, and body piercing             Do not wear make-up, lotions, powders, perfumes/cologne, or deodorant  Do not wear nail polish including gel and S&S, artificial/acrylic nails, or any other type of covering on natural nails including finger and toenails. If you have artificial nails, gel coating, etc. that needs to be removed by a nail salon please have this removed prior to surgery or surgery may need to be canceled/ delayed if the surgeon/ anesthesia feels like they are unable to be safely monitored.   Do not shave  48 hours prior to surgery.    Do not bring valuables to the hospital. [facility] IS NOT             RESPONSIBLE   FOR VALUABLES.   Contacts, glasses, dentures or bridgework may not be worn into surgery.   Bring small overnight bag day of surgery.   DO NOT BRING YOUR HOME MEDICATIONS TO THE HOSPITAL. PHARMACY WILL DISPENSE MEDICATIONS LISTED ON YOUR MEDICATION LIST TO YOU DURING YOUR ADMISSION IN THE HOSPITAL!    Patients discharged on the day of surgery will not be allowed to drive home.  Someone NEEDS to stay with you for the first 24 hours after anesthesia.   Special Instructions: Bring a copy of your healthcare power of attorney and living will documents the day of surgery if you haven't scanned them before.              Please read over the following fact sheets you were given: IF YOU HAVE QUESTIONS ABOUT YOUR PRE-OP INSTRUCTIONS PLEASE CALL [phone] Brandi Dunn   If you received a COVID test during your pre-op visit  it is requested that you wear a mask when  out in public, stay away from anyone that may not be feeling well and notify your surgeon if you develop symptoms. If you test positive for Covid or have been in contact with anyone that has tested positive in the last 10 days please notify you surgeon.    [facility] - Preparing for Surgery Before surgery, you can play an important role.  Because skin is not sterile, your skin needs to be as free of germs as possible.  You can reduce the number of germs on your skin by washing with CHG (chlorahexidine gluconate) soap before surgery.  CHG is an antiseptic cleaner which kills germs and bonds with the skin to continue killing germs even after washing. Please DO NOT use if you have an allergy to CHG or antibacterial soaps.  If your skin becomes reddened/irritated stop using the CHG and inform your nurse when you arrive at Short Stay. Do not shave (including legs and underarms) for at least 48 hours prior to the first CHG shower.  You may shave your face/neck.  Please follow these instructions carefully:  1.  Shower with CHG Soap the night before surgery and the  morning of surgery.  2.  If you choose to wash your hair, wash your hair first as usual with your normal  shampoo.  3.  After you shampoo, rinse your hair and body thoroughly to remove the shampoo.  4.  Use CHG as you would any other liquid soap.  You can apply chg directly to the skin and wash.  Gently with a scrungie or clean washcloth.  5.  Apply the CHG Soap to your body ONLY FROM THE NECK DOWN.   Do   not use on face/ open                           Wound or open sores. Avoid contact with eyes, ears mouth and   genitals (private parts).                       Wash face,  Genitals (private parts) with your normal soap.             6.  Wash thoroughly, paying special attention to the area where your    surgery  will be performed.  7.  Thoroughly rinse your body with warm water  from the neck down.  8.  DO NOT  shower/wash with your normal soap after using and rinsing off the CHG Soap.                9.  Pat yourself dry with a clean towel.            10.  Wear clean pajamas.            11.  Place clean sheets on your bed the night of your first shower and do not  sleep with pets. Day of Surgery : Do not apply any lotions/deodorants the morning of surgery.  Please wear clean clothes to the hospital/surgery center.  FAILURE TO FOLLOW THESE INSTRUCTIONS MAY RESULT IN THE CANCELLATION OF YOUR SURGERY   Incentive Spirometer   An incentive spirometer is a tool that can help keep your lungs clear and active. This tool measures how well you are filling your lungs with each breath. Taking long deep breaths may help reverse or decrease the chance of developing breathing (pulmonary) problems (especially infection) following: A long period of time when you are unable to move or be active. BEFORE THE PROCEDURE  If the spirometer includes an indicator to show your best effort, your nurse or respiratory therapist will set it to a desired goal. If possible, sit up straight or lean slightly forward. Try not to slouch. Hold the incentive spirometer in an upright position. INSTRUCTIONS FOR USE  Sit on the edge of your bed if possible, or sit up as far as you can in bed or on a chair. Hold the incentive spirometer in an upright position. Breathe out normally. Place the mouthpiece in your mouth and seal your lips tightly around it. Breathe in slowly and as deeply as possible, raising the piston or the ball toward the top of the column. Hold your breath for 3-5 seconds or for as long as possible. Allow the piston or ball to fall to the bottom of the column. Remove the mouthpiece from your mouth and breathe out normally. Rest for a few seconds and repeat Steps 1 through 7 at least 10 times every 1-2 hours when you are awake. Take your time and take a few normal breaths between deep breaths. The spirometer may include  an indicator to show your best effort. Use the indicator as a goal to work toward during each repetition. After each set of 10 deep breaths, practice coughing to be sure your lungs are clear. If you have  an incision (the cut made at the time of surgery), support your incision when coughing by placing a pillow or rolled up towels firmly against it. Once you are able to get out of bed, walk around indoors and cough well. You may stop using the incentive spirometer when instructed by your caregiver.  RISKS AND COMPLICATIONS Take your time so you do not get dizzy or light-headed. If you are in pain, you may need to take or ask for pain medication before doing incentive spirometry. It is harder to take a deep breath if you are having pain. AFTER USE Rest and breathe slowly and easily. It can be helpful to keep track of a log of your progress. Your caregiver can provide you with a simple table to help with this. If you are using the spirometer at home, follow these instructions: SEEK MEDICAL CARE IF:  You are having difficultly using the spirometer. You have trouble using the spirometer as often as instructed. Your pain medication is not giving enough relief while using the spirometer. You develop fever of 100.5 F (38.1 C) or higher. SEEK IMMEDIATE MEDICAL CARE IF:  You cough up bloody sputum that had not been present before. You develop fever of 102 F (38.9 C) or greater. You develop worsening pain at or near the incision site. MAKE SURE YOU:  Understand these instructions. Will watch your condition. Will get help right away if you are not doing well or get worse. Document Released: 06/05/2006 Document Revised: 04/17/2011 Document Reviewed: 08/06/2006   WHAT IS A BLOOD TRANSFUSION? Blood Transfusion Information  A transfusion is the replacement of blood or some of its parts. Blood is made up of multiple cells which provide different functions. Red blood cells carry oxygen and are used for  blood loss replacement. White blood cells fight against infection. Platelets control bleeding. Plasma helps clot blood. Other blood products are available for specialized needs, such as hemophilia or other clotting disorders. BEFORE THE TRANSFUSION  Who gives blood for transfusions?  Healthy volunteers who are fully evaluated to make sure their blood is safe. This is blood bank blood. Transfusion therapy is the safest it has ever been in the practice of medicine. Before blood is taken from a donor, a complete history is taken to make sure that person has no history of diseases nor engages in risky social behavior (examples are intravenous drug use or sexual activity with multiple partners). The donor's travel history is screened to minimize risk of transmitting infections, such as malaria. The donated blood is tested for signs of infectious diseases, such as HIV and hepatitis. The blood is then tested to be sure it is compatible with you in order to minimize the chance of a transfusion reaction. If you or a relative donates blood, this is often done in anticipation of surgery and is not appropriate for emergency situations. It takes many days to process the donated blood. RISKS AND COMPLICATIONS Although transfusion therapy is very safe and saves many lives, the main dangers of transfusion include:  Getting an infectious disease. Developing a transfusion reaction. This is an allergic reaction to something in the blood you were given. Every precaution is taken to prevent this. The decision to have a blood transfusion has been considered carefully by your caregiver before blood is given. Blood is not given unless the benefits outweigh the risks. AFTER THE TRANSFUSION Right after receiving a blood transfusion, you will usually feel much better and more energetic. This is especially true if your  red blood cells have gotten low (anemic). The transfusion raises the level of the red blood cells which carry  oxygen, and this usually causes an energy increase. The nurse administering the transfusion will monitor you carefully for complications. HOME CARE INSTRUCTIONS  No special instructions are needed after a transfusion. You may find your energy is better. Speak with your caregiver about any limitations on activity for underlying diseases you may have. SEEK MEDICAL CARE IF:  Your condition is not improving after your transfusion. You develop redness or irritation at the intravenous (IV) site. SEEK IMMEDIATE MEDICAL CARE IF:  Any of the following symptoms occur over the next 12 hours: Shaking chills. You have a temperature by mouth above 102 F (38.9 C), not controlled by medicine. Chest, back, or muscle pain. People around you feel you are not acting correctly or are confused. Shortness of breath or difficulty breathing. Dizziness and fainting. You get a rash or develop hives. You have a decrease in urine output. Your urine turns a dark color or changes to pink, red, or brown. Any of the following symptoms occur over the next 10 days: You have a temperature by mouth above 102 F (38.9 C), not controlled by medicine. Shortness of breath. Weakness after normal activity. The white part of the eye turns yellow (jaundice). You have a decrease in the amount of urine or are urinating less often. Your urine turns a dark color or changes to pink, red, or brown.  MORNING OF SURGERY DRINK:   DRINK 1 G2 drink BEFORE YOU LEAVE HOME, DRINK ALL OF THE  G2 DRINK AT ONE TIME.   NO SOLID FOOD AFTER 600 PM THE NIGHT BEFORE YOUR SURGERY. YOU MAY DRINK CLEAR FLUIDS. THE G2 DRINK YOU DRINK BEFORE YOU LEAVE HOME WILL BE THE LAST FLUIDS YOU DRINK BEFORE SURGERY.  PAIN IS EXPECTED AFTER SURGERY AND WILL NOT BE COMPLETELY ELIMINATED. AMBULATION AND TYLENOL  WILL HELP REDUCE INCISIONAL AND GAS PAIN. MOVEMENT IS KEY!  YOU ARE EXPECTED TO BE OUT OF BED WITHIN 4 HOURS OF ADMISSION TO YOUR PATIENT ROOM.  SITTING IN  THE RECLINER THROUGHOUT THE DAY IS IMPORTANT FOR DRINKING FLUIDS AND MOVING GAS THROUGHOUT THE GI TRACT.  COMPRESSION STOCKINGS SHOULD BE WORN [facility] STAY UNLESS YOU ARE WALKING.   INCENTIVE SPIROMETER SHOULD BE USED EVERY HOUR WHILE AWAKE TO DECREASE POST-OPERATIVE COMPLICATIONS SUCH AS PNEUMONIA.  WHEN DISCHARGED HOME, IT IS IMPORTANT TO CONTINUE TO WALK EVERY HOUR AND USE THE INCENTIVE SPIROMETER EVERY HOUR.

## 2023-09-13 NOTE — Telephone Encounter (Signed)
 Prescription Request  09/13/2023  LOV: Visit date not found  What is the name of the medication or equipment? omeprazole  (PRILOSEC) 40 MG capsule   Have you contacted your pharmacy to request a refill? Yes   Which pharmacy would you like this sent to?  [id number] - [place], [place] - [address] [place] [address] Phone: [phone] Fax: [phone]    Patient notified that their request is being sent to the clinical staff for review and that they should receive a response within 2 business days.   Please advise at Mobile [phone] (mobile)

## 2023-09-13 NOTE — Progress Notes (Signed)
 COVID Vaccine received:  []  No [x]  Yes Date of any COVID positive Test in last 90 days: no PCP - Dr. Glendia Fielding Cardiologist - n/a  Chest x-ray - 08/17/23 Epic EKG -  2'88'74 Epic Stress Test -  ECHO -  Cardiac Cath -   Bowel Prep - [x]  No  []   Yes ______  Pacemaker / ICD device [x]  No []  Yes   Spinal Cord Stimulator:[x]  No []  Yes       History of Sleep Apnea? [x]  No []  Yes   CPAP used?- [x]  No []  Yes    Does the patient monitor blood sugar?          [x]  No []  Yes  []  N/A  Patient has: [x]  NO Hx DM   []  Pre-DM                 []  DM1  []   DM2 Does patient have a [employer] or [employer]? []  No []  Yes   Fasting Blood Sugar Ranges-  Checks Blood Sugar _____ times a day  GLP1 agonist / usual dose - no GLP1 instructions:  SGLT-2 inhibitors / usual dose - no SGLT-2 instructions:   Blood Thinner / Instructions:no Aspirin Instructions:no  Comments:   Activity level: Patient is able  to climb a flight of stairs without difficulty; [x]  No CP  [x]  No SOB,    Patient can perform ADLs without assistance.   Anesthesia review:   Patient denies shortness of breath, fever, cough and chest pain at PAT appointment.  Patient verbalized understanding and agreement to the Pre-Surgical Instructions that were given to them at this PAT appointment. Patient was also educated of the need to review these PAT instructions again prior to his/her surgery.I reviewed the appropriate phone numbers to call if they have any and questions or concerns.

## 2023-09-14 ENCOUNTER — Other Ambulatory Visit: Payer: Self-pay

## 2023-09-14 ENCOUNTER — Encounter (HOSPITAL_COMMUNITY): Payer: Self-pay | Admitting: *Deleted

## 2023-09-14 ENCOUNTER — Encounter (HOSPITAL_COMMUNITY)
Admission: RE | Admit: 2023-09-14 | Discharge: 2023-09-14 | Disposition: A | Discharge status: Home / Self Care | Source: Ambulatory Visit | Attending: Surgery | Admitting: Surgery

## 2023-09-14 VITALS — BP 133/74 | HR 80 | Temp 98.2°F | Resp 16 | Ht 64.0 in | Wt 260.0 lb

## 2023-09-14 DIAGNOSIS — Z01818 Encounter for other preprocedural examination: Secondary | ICD-10-CM | POA: Insufficient documentation

## 2023-09-14 LAB — CBC WITH DIFFERENTIAL/PLATELET
Abs Immature Granulocytes: 0.01 K/uL (ref 0.00–0.07)
Basophils Absolute: 0 K/uL (ref 0.0–0.1)
Basophils Relative: 1 %
Eosinophils Absolute: 0.1 K/uL (ref 0.0–0.5)
Eosinophils Relative: 1 %
HCT: 40.1 % (ref 36.0–46.0)
Hemoglobin: 12.6 g/dL (ref 12.0–15.0)
Immature Granulocytes: 0 %
Lymphocytes Relative: 45 %
Lymphs Abs: 2.8 K/uL (ref 0.7–4.0)
MCH: 27.6 pg (ref 26.0–34.0)
MCHC: 31.4 g/dL (ref 30.0–36.0)
MCV: 87.7 fL (ref 80.0–100.0)
Monocytes Absolute: 0.3 K/uL (ref 0.1–1.0)
Monocytes Relative: 5 %
Neutro Abs: 3.1 K/uL (ref 1.7–7.7)
Neutrophils Relative %: 48 %
Platelets: 252 K/uL (ref 150–400)
RBC: 4.57 MIL/uL (ref 3.87–5.11)
RDW: 13.5 % (ref 11.5–15.5)
WBC: 6.3 K/uL (ref 4.0–10.5)
nRBC: 0 % (ref 0.0–0.2)

## 2023-09-14 LAB — COMPREHENSIVE METABOLIC PANEL WITH GFR
ALT: 17 U/L (ref 0–44)
AST: 15 U/L (ref 15–41)
Albumin: 3.7 g/dL (ref 3.5–5.0)
Alkaline Phosphatase: 43 U/L (ref 38–126)
Anion gap: 9 (ref 5–15)
BUN: 10 mg/dL (ref 6–20)
CO2: 25 mmol/L (ref 22–32)
Calcium: 8.8 mg/dL — ABNORMAL LOW (ref 8.9–10.3)
Chloride: 104 mmol/L (ref 98–111)
Creatinine, Ser: 0.94 mg/dL (ref 0.44–1.00)
GFR, Estimated: >60 mL/min (ref >60)
Glucose, Bld: 91 mg/dL (ref 70–99)
Potassium: 3.7 mmol/L (ref 3.5–5.1)
Sodium: 138 mmol/L (ref 135–145)
Total Bilirubin: 0.6 mg/dL (ref 0.0–1.2)
Total Protein: 6.9 g/dL (ref 6.5–8.1)

## 2023-09-20 NOTE — Anesthesia Preprocedure Evaluation (Addendum)
 Anesthesia Evaluation  Patient identified by MRN, date of birth, ID band Patient awake    Reviewed: Allergy & Precautions, H&P , NPO status , Patient's Chart, lab work & pertinent test results  Airway Mallampati: III  TM Distance: >3 FB Neck ROM: Full    Dental  (+) Dental Advisory Given, Teeth Intact   Pulmonary neg pulmonary ROS   Pulmonary exam normal breath sounds clear to auscultation       Cardiovascular Exercise Tolerance: Good negative cardio ROS Normal cardiovascular exam Rhythm:Regular Rate:Normal     Neuro/Psych  Headaches PSYCHIATRIC DISORDERS Anxiety     negative neurological ROS  negative psych ROS   GI/Hepatic Neg liver ROS,GERD  Medicated,,  Endo/Other    Class 3 obesity  Renal/GU Renal disease     Musculoskeletal   Abdominal  (+) + obese  Peds  Hematology negative hematology ROS (+)   Anesthesia Other Findings   Reproductive/Obstetrics negative OB ROS                              Anesthesia Physical Anesthesia Plan  ASA: 3  Anesthesia Plan: General   Post-op Pain Management: Tylenol  PO (pre-op)* and Gabapentin  PO (pre-op)*   Induction:   PONV Risk Score and Plan: 4 or greater and Propofol  infusion, Treatment may vary due to age or medical condition, Midazolam , Ondansetron , Dexamethasone , Scopolamine  patch - Pre-op, Aprepitant  and TIVA  Airway Management Planned: Oral ETT and Video Laryngoscope Planned  Additional Equipment:   Intra-op Plan:   Post-operative Plan: Extubation in OR  Informed Consent: I have reviewed the patients History and Physical, chart, labs and discussed the procedure including the risks, benefits and alternatives for the proposed anesthesia with the patient or authorized representative who has indicated his/her understanding and acceptance.     Dental advisory given  Plan Discussed with: CRNA  Anesthesia Plan Comments:          Anesthesia Quick Evaluation

## 2023-09-21 ENCOUNTER — Ambulatory Visit (HOSPITAL_COMMUNITY)
Admission: RE | Admit: 2023-09-21 | Discharge: 2023-09-22 | Disposition: A | Discharge status: Home / Self Care | Source: Ambulatory Visit | Attending: Surgery | Admitting: Surgery

## 2023-09-21 ENCOUNTER — Other Ambulatory Visit (HOSPITAL_COMMUNITY): Payer: Self-pay

## 2023-09-21 ENCOUNTER — Other Ambulatory Visit: Payer: Self-pay

## 2023-09-21 ENCOUNTER — Encounter (HOSPITAL_COMMUNITY): Payer: Self-pay | Admitting: Surgery

## 2023-09-21 ENCOUNTER — Encounter (HOSPITAL_COMMUNITY)
Admission: RE | Disposition: A | Discharge status: Home / Self Care | Payer: Self-pay | Source: Ambulatory Visit | Attending: Surgery

## 2023-09-21 ENCOUNTER — Ambulatory Visit (HOSPITAL_COMMUNITY): Admitting: Anesthesiology

## 2023-09-21 ENCOUNTER — Telehealth (HOSPITAL_COMMUNITY): Payer: Self-pay | Admitting: Pharmacy Technician

## 2023-09-21 DIAGNOSIS — K449 Diaphragmatic hernia without obstruction or gangrene: Secondary | ICD-10-CM | POA: Diagnosis not present

## 2023-09-21 DIAGNOSIS — Z6841 Body Mass Index (BMI) 40.0 and over, adult: Secondary | ICD-10-CM | POA: Diagnosis not present

## 2023-09-21 DIAGNOSIS — E66813 Obesity, class 3: Secondary | ICD-10-CM | POA: Diagnosis not present

## 2023-09-21 DIAGNOSIS — K219 Gastro-esophageal reflux disease without esophagitis: Secondary | ICD-10-CM | POA: Diagnosis not present

## 2023-09-21 DIAGNOSIS — Z79899 Other long term (current) drug therapy: Secondary | ICD-10-CM | POA: Insufficient documentation

## 2023-09-21 HISTORY — PX: UPPER GI ENDOSCOPY: SHX6162

## 2023-09-21 HISTORY — PX: GASTRIC ROUX-EN-Y: SHX5262

## 2023-09-21 HISTORY — PX: HIATAL HERNIA REPAIR: SHX195

## 2023-09-21 LAB — TYPE AND SCREEN
ABO/RH(D): O POS
Antibody Screen: NEGATIVE

## 2023-09-21 LAB — ABO/RH: ABO/RH(D): O POS

## 2023-09-21 LAB — POCT PREGNANCY, URINE: Preg Test, Ur: NEGATIVE

## 2023-09-21 SURGERY — LAPAROSCOPIC ROUX-EN-Y GASTRIC
Anesthesia: General

## 2023-09-21 MED ORDER — PROPOFOL 10 MG/ML IV BOLUS
INTRAVENOUS | Status: AC
  Filled 2023-09-21: qty 20

## 2023-09-21 MED ORDER — GABAPENTIN 100 MG PO CAPS
100.0000 mg | ORAL_CAPSULE | Freq: Two times a day (BID) | ORAL | Status: DC
  Administered 2023-09-21 – 2023-09-22 (×2): 100 mg via ORAL
  Filled 2023-09-21 (×2): qty 1

## 2023-09-21 MED ORDER — SUGAMMADEX SODIUM 200 MG/2ML IV SOLN
INTRAVENOUS | Status: DC | PRN
  Administered 2023-09-21: 300 mg via INTRAVENOUS

## 2023-09-21 MED ORDER — LACTATED RINGERS IV SOLN: INTRAVENOUS | Status: DC

## 2023-09-21 MED ORDER — 0.9 % SODIUM CHLORIDE (POUR BTL) OPTIME
TOPICAL | Status: DC | PRN
Start: 2023-09-21 — End: 2023-09-21
  Administered 2023-09-21: 1000 mL

## 2023-09-21 MED ORDER — STERILE WATER FOR IRRIGATION IR SOLN
Status: DC | PRN
  Administered 2023-09-21 (×2): 1000 mL

## 2023-09-21 MED ORDER — OXYCODONE HCL 5 MG/5ML PO SOLN
5.0000 mg | Freq: Four times a day (QID) | ORAL | Status: DC | PRN
  Administered 2023-09-21 – 2023-09-22 (×3): 5 mg via ORAL
  Filled 2023-09-21 (×3): qty 5

## 2023-09-21 MED ORDER — HEPARIN SODIUM (PORCINE) 5000 UNIT/ML IJ SOLN
5000.0000 [IU] | INTRAMUSCULAR | Status: AC
  Administered 2023-09-21: 5000 [IU] via SUBCUTANEOUS
  Filled 2023-09-21: qty 1

## 2023-09-21 MED ORDER — ACETAMINOPHEN 500 MG PO TABS
1000.0000 mg | ORAL_TABLET | ORAL | Status: AC
  Administered 2023-09-21: 1000 mg via ORAL
  Filled 2023-09-21: qty 2

## 2023-09-21 MED ORDER — SUGAMMADEX SODIUM 200 MG/2ML IV SOLN
INTRAVENOUS | Status: AC
Start: 2023-09-21 — End: 2023-09-21
  Filled 2023-09-21: qty 6

## 2023-09-21 MED ORDER — HEPARIN SODIUM (PORCINE) 5000 UNIT/ML IJ SOLN
5000.0000 [IU] | Freq: Three times a day (TID) | INTRAMUSCULAR | Status: DC
  Administered 2023-09-21 – 2023-09-22 (×3): 5000 [IU] via SUBCUTANEOUS
  Filled 2023-09-21 (×3): qty 1

## 2023-09-21 MED ORDER — MIDAZOLAM HCL 2 MG/2ML IJ SOLN
INTRAMUSCULAR | Status: AC
  Filled 2023-09-21: qty 2

## 2023-09-21 MED ORDER — LIDOCAINE HCL (CARDIAC) PF 100 MG/5ML IV SOSY
PREFILLED_SYRINGE | INTRAVENOUS | Status: DC | PRN
  Administered 2023-09-21: 100 mg via INTRAVENOUS

## 2023-09-21 MED ORDER — FENTANYL CITRATE (PF) 250 MCG/5ML IJ SOLN
INTRAMUSCULAR | Status: AC
  Filled 2023-09-21: qty 5

## 2023-09-21 MED ORDER — HYDROMORPHONE HCL 1 MG/ML IJ SOLN
0.5000 mg | INTRAMUSCULAR | Status: DC | PRN
  Administered 2023-09-21: 0.5 mg via INTRAVENOUS
  Filled 2023-09-21: qty 0.5

## 2023-09-21 MED ORDER — ONDANSETRON HCL 4 MG/2ML IJ SOLN
INTRAMUSCULAR | Status: AC
  Filled 2023-09-21: qty 2

## 2023-09-21 MED ORDER — MIDAZOLAM HCL 5 MG/5ML IJ SOLN
INTRAMUSCULAR | Status: DC | PRN
  Administered 2023-09-21: 2 mg via INTRAVENOUS

## 2023-09-21 MED ORDER — SODIUM CHLORIDE 0.9 % IV SOLN
2.0000 g | INTRAVENOUS | Status: AC
  Administered 2023-09-21: 2 g via INTRAVENOUS
  Filled 2023-09-21: qty 2

## 2023-09-21 MED ORDER — ENOXAPARIN (LOVENOX) PATIENT EDUCATION KIT
PACK | Freq: Once | Status: AC
  Filled 2023-09-21: qty 1

## 2023-09-21 MED ORDER — APREPITANT 40 MG PO CAPS
40.0000 mg | ORAL_CAPSULE | ORAL | Status: AC
  Administered 2023-09-21: 40 mg via ORAL
  Filled 2023-09-21: qty 1

## 2023-09-21 MED ORDER — GABAPENTIN 300 MG PO CAPS: 300.0000 mg | ORAL_CAPSULE | Freq: Once | ORAL | Status: DC

## 2023-09-21 MED ORDER — SODIUM CHLORIDE 0.9 % IV SOLN: INTRAVENOUS | Status: DC

## 2023-09-21 MED ORDER — ACETAMINOPHEN 500 MG PO TABS: 1000.0000 mg | ORAL_TABLET | Freq: Once | ORAL | Status: DC

## 2023-09-21 MED ORDER — ROCURONIUM BROMIDE 100 MG/10ML IV SOLN
INTRAVENOUS | Status: DC | PRN
  Administered 2023-09-21: 20 mg via INTRAVENOUS
  Administered 2023-09-21: 60 mg via INTRAVENOUS

## 2023-09-21 MED ORDER — CHLORHEXIDINE GLUCONATE 4 % EX SOLN: 60.0000 mL | Freq: Once | CUTANEOUS | Status: DC

## 2023-09-21 MED ORDER — LACTATED RINGERS IR SOLN
Status: DC | PRN
  Administered 2023-09-21: 1000 mL

## 2023-09-21 MED ORDER — SUMATRIPTAN SUCCINATE 50 MG PO TABS
50.0000 mg | ORAL_TABLET | ORAL | Status: DC | PRN
  Filled 2023-09-21: qty 1

## 2023-09-21 MED ORDER — DOCUSATE SODIUM 100 MG PO CAPS
100.0000 mg | ORAL_CAPSULE | Freq: Two times a day (BID) | ORAL | Status: DC
  Administered 2023-09-21 – 2023-09-22 (×2): 100 mg via ORAL
  Filled 2023-09-21 (×2): qty 1

## 2023-09-21 MED ORDER — FENTANYL CITRATE (PF) 100 MCG/2ML IJ SOLN
INTRAMUSCULAR | Status: DC | PRN
  Administered 2023-09-21: 50 ug via INTRAVENOUS
  Administered 2023-09-21: 100 ug via INTRAVENOUS
  Administered 2023-09-21: 50 ug via INTRAVENOUS

## 2023-09-21 MED ORDER — ACETAMINOPHEN 500 MG PO TABS: 1000.0000 mg | ORAL_TABLET | Freq: Three times a day (TID) | ORAL | Status: AC

## 2023-09-21 MED ORDER — ONDANSETRON HCL 4 MG/2ML IJ SOLN: 4.0000 mg | INTRAMUSCULAR | Status: DC | PRN

## 2023-09-21 MED ORDER — DEXMEDETOMIDINE HCL IN NACL 80 MCG/20ML IV SOLN
INTRAVENOUS | Status: DC | PRN
  Administered 2023-09-21: 8 ug via INTRAVENOUS
  Administered 2023-09-21: 12 ug via INTRAVENOUS

## 2023-09-21 MED ORDER — FIBRIN SEALANT 2 ML SINGLE DOSE KIT
2.0000 mL | PACK | Freq: Once | CUTANEOUS | Status: AC
  Administered 2023-09-21: 2 mL via TOPICAL
  Filled 2023-09-21: qty 2

## 2023-09-21 MED ORDER — PHENYLEPHRINE 80 MCG/ML (10ML) SYRINGE FOR IV PUSH (FOR BLOOD PRESSURE SUPPORT)
PREFILLED_SYRINGE | INTRAVENOUS | Status: DC | PRN
  Administered 2023-09-21 (×2): 80 ug via INTRAVENOUS

## 2023-09-21 MED ORDER — METHOCARBAMOL 1000 MG/10ML IJ SOLN: 500.0000 mg | Freq: Four times a day (QID) | INTRAMUSCULAR | Status: DC | PRN

## 2023-09-21 MED ORDER — PHENYLEPHRINE 80 MCG/ML (10ML) SYRINGE FOR IV PUSH (FOR BLOOD PRESSURE SUPPORT)
PREFILLED_SYRINGE | INTRAVENOUS | Status: AC
  Filled 2023-09-21: qty 10

## 2023-09-21 MED ORDER — DROPERIDOL 2.5 MG/ML IJ SOLN
INTRAMUSCULAR | Status: AC
  Filled 2023-09-21: qty 2

## 2023-09-21 MED ORDER — DOCUSATE SODIUM 100 MG PO CAPS: 100.0000 mg | ORAL_CAPSULE | Freq: Two times a day (BID) | ORAL | 0 refills | Status: AC

## 2023-09-21 MED ORDER — ENOXAPARIN SODIUM 40 MG/0.4ML IJ SOSY: 40.0000 mg | PREFILLED_SYRINGE | Freq: Two times a day (BID) | INTRAMUSCULAR | 0 refills | Status: AC

## 2023-09-21 MED ORDER — ACETAMINOPHEN 500 MG PO TABS
1000.0000 mg | ORAL_TABLET | Freq: Three times a day (TID) | ORAL | Status: DC
  Administered 2023-09-22 (×2): 1000 mg via ORAL
  Filled 2023-09-21 (×3): qty 2

## 2023-09-21 MED ORDER — GABAPENTIN 100 MG PO CAPS
100.0000 mg | ORAL_CAPSULE | ORAL | Status: AC
  Administered 2023-09-21: 100 mg via ORAL
  Filled 2023-09-21: qty 1

## 2023-09-21 MED ORDER — SIMETHICONE 80 MG PO CHEW
80.0000 mg | CHEWABLE_TABLET | Freq: Four times a day (QID) | ORAL | Status: DC | PRN
  Administered 2023-09-21: 80 mg via ORAL

## 2023-09-21 MED ORDER — HYDROMORPHONE HCL 1 MG/ML IJ SOLN
INTRAMUSCULAR | Status: AC
  Filled 2023-09-21: qty 1

## 2023-09-21 MED ORDER — ORAL CARE MOUTH RINSE: 15.0000 mL | Freq: Once | OROMUCOSAL | Status: AC

## 2023-09-21 MED ORDER — METOPROLOL TARTRATE 5 MG/5ML IV SOLN: 5.0000 mg | Freq: Four times a day (QID) | INTRAVENOUS | Status: DC | PRN

## 2023-09-21 MED ORDER — HYDRALAZINE HCL 20 MG/ML IJ SOLN: 10.0000 mg | INTRAMUSCULAR | Status: DC | PRN

## 2023-09-21 MED ORDER — TRAMADOL HCL 50 MG PO TABS: 50.0000 mg | ORAL_TABLET | Freq: Four times a day (QID) | ORAL | 0 refills | Status: AC | PRN

## 2023-09-21 MED ORDER — DEXAMETHASONE SODIUM PHOSPHATE 10 MG/ML IJ SOLN
INTRAMUSCULAR | Status: DC | PRN
  Administered 2023-09-21: 5 mg via INTRAVENOUS

## 2023-09-21 MED ORDER — ENSURE MAX PROTEIN PO LIQD
2.0000 [oz_av] | ORAL | Status: DC
  Administered 2023-09-22 (×4): 2 [oz_av] via ORAL

## 2023-09-21 MED ORDER — VISTASEAL 4 ML SINGLE DOSE KIT
4.0000 mL | PACK | Freq: Once | CUTANEOUS | Status: AC
  Administered 2023-09-21: 4 mL via TOPICAL
  Filled 2023-09-21: qty 4

## 2023-09-21 MED ORDER — DEXAMETHASONE SODIUM PHOSPHATE 10 MG/ML IJ SOLN
INTRAMUSCULAR | Status: AC
  Filled 2023-09-21: qty 1

## 2023-09-21 MED ORDER — SCOPOLAMINE 1 MG/3DAYS TD PT72
1.0000 | MEDICATED_PATCH | TRANSDERMAL | Status: DC
  Administered 2023-09-21: 1.5 mg via TRANSDERMAL
  Filled 2023-09-21: qty 1

## 2023-09-21 MED ORDER — METOCLOPRAMIDE HCL 5 MG/ML IJ SOLN
10.0000 mg | Freq: Four times a day (QID) | INTRAMUSCULAR | Status: DC
  Administered 2023-09-21 – 2023-09-22 (×4): 10 mg via INTRAVENOUS
  Filled 2023-09-21 (×4): qty 2

## 2023-09-21 MED ORDER — HYDROMORPHONE HCL 1 MG/ML IJ SOLN
0.2500 mg | INTRAMUSCULAR | Status: DC | PRN
  Administered 2023-09-21: 0.5 mg via INTRAVENOUS

## 2023-09-21 MED ORDER — CHLORHEXIDINE GLUCONATE 0.12 % MT SOLN
15.0000 mL | Freq: Once | OROMUCOSAL | Status: AC
  Administered 2023-09-21: 15 mL via OROMUCOSAL

## 2023-09-21 MED ORDER — GABAPENTIN 100 MG PO CAPS
100.0000 mg | ORAL_CAPSULE | Freq: Two times a day (BID) | ORAL | 0 refills | Status: AC
Start: 2023-09-21 — End: 2023-09-26

## 2023-09-21 MED ORDER — ONDANSETRON HCL 4 MG/2ML IJ SOLN
INTRAMUSCULAR | Status: DC | PRN
  Administered 2023-09-21: 4 mg via INTRAVENOUS

## 2023-09-21 MED ORDER — BUPIVACAINE-EPINEPHRINE (PF) 0.25% -1:200000 IJ SOLN
INTRAMUSCULAR | Status: AC
  Filled 2023-09-21: qty 60

## 2023-09-21 MED ORDER — PANTOPRAZOLE SODIUM 40 MG PO TBEC: 40.0000 mg | DELAYED_RELEASE_TABLET | Freq: Every day | ORAL | 0 refills | Status: AC

## 2023-09-21 MED ORDER — PROPOFOL 10 MG/ML IV BOLUS
INTRAVENOUS | Status: DC | PRN
Start: 2023-09-21 — End: 2023-09-21
  Administered 2023-09-21: 200 mg via INTRAVENOUS

## 2023-09-21 MED ORDER — BUPIVACAINE-EPINEPHRINE 0.25% -1:200000 IJ SOLN
INTRAMUSCULAR | Status: DC | PRN
  Administered 2023-09-21: 60 mL

## 2023-09-21 MED ORDER — TRAMADOL HCL 50 MG PO TABS: 50.0000 mg | ORAL_TABLET | Freq: Four times a day (QID) | ORAL | Status: DC | PRN

## 2023-09-21 MED ORDER — PANTOPRAZOLE SODIUM 40 MG IV SOLR
40.0000 mg | Freq: Every day | INTRAVENOUS | Status: DC
  Administered 2023-09-21: 40 mg via INTRAVENOUS
  Filled 2023-09-21: qty 10

## 2023-09-21 MED ORDER — ONDANSETRON 4 MG PO TBDP: 4.0000 mg | ORAL_TABLET | Freq: Four times a day (QID) | ORAL | 0 refills | Status: AC | PRN

## 2023-09-21 MED ORDER — ACETAMINOPHEN 160 MG/5ML PO SOLN
1000.0000 mg | Freq: Three times a day (TID) | ORAL | Status: DC
  Administered 2023-09-21 (×2): 1000 mg via ORAL
  Filled 2023-09-21 (×3): qty 40.6

## 2023-09-21 MED ORDER — DROPERIDOL 2.5 MG/ML IJ SOLN
0.6250 mg | Freq: Once | INTRAMUSCULAR | Status: AC | PRN
  Administered 2023-09-21: 0.625 mg via INTRAVENOUS

## 2023-09-21 SURGICAL SUPPLY — 53 items
APPLICATOR COTTON TIP 6 STRL (MISCELLANEOUS) IMPLANT
APPLICATOR VISTASEAL 35 (MISCELLANEOUS) ×3 IMPLANT
BAG COUNTER SPONGE SURGICOUNT (BAG) IMPLANT
BENZOIN TINCTURE PRP APPL 2/3 (GAUZE/BANDAGES/DRESSINGS) ×3 IMPLANT
BLADE SURG SZ11 CARB STEEL (BLADE) ×3 IMPLANT
BNDG ADH 1X3 SHEER STRL LF (GAUZE/BANDAGES/DRESSINGS) ×18 IMPLANT
CABLE HIGH FREQUENCY MONO STRZ (ELECTRODE) IMPLANT
CHLORAPREP W/TINT 26 (MISCELLANEOUS) ×6 IMPLANT
CLIP APPLIE ROT 13.4 12 LRG (CLIP) IMPLANT
CLIP SUT LAPRA TY ABSORB (SUTURE) ×6 IMPLANT
COVER SURGICAL LIGHT HANDLE (MISCELLANEOUS) ×3 IMPLANT
DEVICE SUT QUICK LOAD TK 5 (SUTURE) IMPLANT
DEVICE SUT TI-KNOT TK 5X26 (SUTURE) IMPLANT
DEVICE SUTURE ENDOST 10MM (ENDOMECHANICALS) ×3 IMPLANT
DRAIN PENROSE 0.25X18 (DRAIN) ×3 IMPLANT
ELECT REM PT RETURN 15FT ADLT (MISCELLANEOUS) ×3 IMPLANT
GAUZE 4X4 16PLY ~~LOC~~+RFID DBL (SPONGE) ×3 IMPLANT
GLOVE BIO SURGEON STRL SZ 6 (GLOVE) ×3 IMPLANT
GLOVE INDICATOR 6.5 STRL GRN (GLOVE) ×3 IMPLANT
GOWN STRL REUS W/ TWL LRG LVL3 (GOWN DISPOSABLE) ×3 IMPLANT
IRRIGATION SUCT STRKRFLW 2 WTP (MISCELLANEOUS) ×3 IMPLANT
KIT BASIN OR (CUSTOM PROCEDURE TRAY) ×3 IMPLANT
KIT GASTRIC LAVAGE 34FR ADT (SET/KITS/TRAYS/PACK) ×3 IMPLANT
KIT TURNOVER KIT A (KITS) ×3 IMPLANT
MARKER SKIN DUAL TIP RULER LAB (MISCELLANEOUS) ×3 IMPLANT
MAT PREVALON FULL STRYKER (MISCELLANEOUS) ×3 IMPLANT
NDL SPNL 22GX3.5 QUINCKE BK (NEEDLE) ×3 IMPLANT
NEEDLE SPNL 22GX3.5 QUINCKE BK (NEEDLE) ×2 IMPLANT
PACK CARDIOVASCULAR III (CUSTOM PROCEDURE TRAY) ×3 IMPLANT
RELOAD STAPLE 60 2.6 WHT THN (STAPLE) ×6 IMPLANT
RELOAD STAPLE 60 3.6 BLU REG (STAPLE) ×6 IMPLANT
RELOAD STAPLE 60 3.8 GOLD REG (STAPLE) IMPLANT
RELOAD STAPLE 60 4.1 GRN THCK (STAPLE) IMPLANT
RELOAD SUT SNGL STCH ABSRB 2-0 (ENDOMECHANICALS) ×12 IMPLANT
RELOAD SUT SNGL STCH BLK 2-0 (ENDOMECHANICALS) ×12 IMPLANT
SCISSORS LAP 5X45 EPIX DISP (ENDOMECHANICALS) ×3 IMPLANT
SET TUBE SMOKE EVAC HIGH FLOW (TUBING) ×3 IMPLANT
SHEARS HARMONIC 45 ACE (MISCELLANEOUS) ×3 IMPLANT
SLEEVE ADV FIXATION 12X100MM (TROCAR) IMPLANT
SLEEVE ADV FIXATION 5X100MM (TROCAR) ×6 IMPLANT
SOLUTION ANTFG W/FOAM PAD STRL (MISCELLANEOUS) ×3 IMPLANT
STAPLER ECHELON BIOABSB 60 FLE (MISCELLANEOUS) IMPLANT
STAPLER ECHELON LONG 60 440 (INSTRUMENTS) ×3 IMPLANT
STRIP CLOSURE SKIN 1/2X4 (GAUZE/BANDAGES/DRESSINGS) ×3 IMPLANT
SUT MNCRL AB 4-0 PS2 18 (SUTURE) ×3 IMPLANT
SUT SURGIDAC NAB ES-9 0 48 120 (SUTURE) IMPLANT
SUT VIC AB 2-0 SH 27X BRD (SUTURE) ×3 IMPLANT
SYR 20ML LL LF (SYRINGE) ×3 IMPLANT
TOWEL OR 17X26 10 PK STRL BLUE (TOWEL DISPOSABLE) ×3 IMPLANT
TROCAR ADV FIXATION 12X100MM (TROCAR) ×3 IMPLANT
TROCAR ADV FIXATION 5X100MM (TROCAR) ×3 IMPLANT
TROCAR XCEL NON-BLD 5MMX100MML (ENDOMECHANICALS) ×3 IMPLANT
TUBING CONNECTING 10 (TUBING) IMPLANT

## 2023-09-21 NOTE — Telephone Encounter (Signed)
 Patient Product/process development scientist completed.    The patient is insured through [facility]. Patient has [employer], may use a copay card, and/or apply for patient assistance if available.    Ran test claim for enoxaparin  40 mg/0.4 ml and the current 14 day co-pay is $0.00.   This test claim was processed through [facility]- copay amounts may vary at other pharmacies due to pharmacy/plan contracts, or as the patient moves through the different stages of their insurance plan.     Reyes Sharps, CPHT Pharmacy Technician III Certified Patient Advocate [facility] Patient Advocate Team Direct Number: [phone]  Fax: [phone]

## 2023-09-21 NOTE — Discharge Instructions (Signed)

## 2023-09-21 NOTE — Plan of Care (Signed)
  Problem: Health Behavior/Discharge Planning: Goal: Ability to manage health-related needs will improve Outcome: Progressing   Problem: Clinical Measurements: Goal: Will remain free from infection Outcome: Progressing   Problem: Activity: Goal: Risk for activity intolerance will decrease Outcome: Progressing   Problem: Coping: Goal: Level of anxiety will decrease Outcome: Progressing   Problem: Pain Managment: Goal: General experience of comfort will improve and/or be controlled Outcome: Progressing   Problem: Activity: Goal: Ability to tolerate increased activity will improve Outcome: Progressing   Problem: Bowel/Gastric: Goal: Gastrointestinal status for postoperative course will improve Outcome: Progressing   Problem: Fluid Volume: Goal: Maintenance of adequate hydration will improve Outcome: Progressing   Problem: Clinical Measurements: Goal: Will remain free from infection Outcome: Progressing   Problem: Pain Management: Goal: Pain level will decrease Outcome: Progressing

## 2023-09-21 NOTE — Progress Notes (Signed)
     Name: Brandi Dunn                Patient MRN: [id number]  DOA: 09/21/2023   Patient seen in room 1309  Patient sitting upright in recliner. Family at bedside. Patient stated that the water  intake has gone well thus far and noted to have burped. Patient did not report any nausea; only stated that she felt some minimal discomfort in her mid-abdominal area which felt like gas pressure. Pain is has been controled well. Patient has ambulated in hall and been working on incentive spirometer, still working on water  intake goals at this time. Patient's incisions looked dry and intact  - some dried blood drainage noted on far left bandage otherwise incisions clean. Abdomen was soft, non-tender.  Recent Vital signs in last 24 hours:    09/21/2023    9:16 PM 09/21/2023    5:20 PM 09/21/2023    4:08 PM  Vitals with BMI  Systolic 150 139 847  Diastolic 83 83 89  Pulse 108 97 96     Intake/Output:   Intake/Output Summary (Last 24 hours) at 09/21/2023 2158 Last data filed at 09/21/2023 2100 Gross per 24 hour  Intake 2664.72 ml  Output 820 ml  Net 1844.72 ml    Reviewed QI Goals for Discharge document with patient including ambulation in halls, Incentive Spirometry use every hour, and oral care.  Also discussed pain and nausea control.  Reminded pt of sitting upright including head of bed 30 degree. Patient preferred the recliner. BSTOP education provided including BSTOP information guide, Guide for Pain Management after your Bariatric Procedure.  Diet progression education provided including Bariatric Surgery Post-Op Food Plan Phase 1: Liquids.    Due to patient being alert, oriented, attentive, and patient family at bedside - also reviewed bariatric post-op education information with patient. Providing post-op folder contents as well for reference.   Reviewed Gastric sleeve/bypass discharge instructions with patient and patient is able to articulate understanding. Provided information  on BELT program, Support Group, BSTOP-D, and [facility] outpatient pharmacy. All questions presented answered. per hydration protocol, bariatric nurse coordinator to make follow-up phone call within one week.      Thank you,  Roseann Medley, RN, MSN Bariatric Nurse Coordinator [phone] (office)

## 2023-09-21 NOTE — Op Note (Signed)
 Operative Note  Brandi Dunn  [id number]  [id number]  09/21/2023   Surgeon: Mitzie Freund MD FACS   Assistant: Camellia Blush MD FACS   Procedure performed: laparoscopic Roux-en-Y gastric bypass (antecolic, antegastric), hiatal hernia repair (full 360 degree dissection and esophageal mobilization), upper endoscopy   Preop diagnosis: Morbid obesity Body mass index is 44.83 kg/m.,  GERD Post-op diagnosis/intraop findings: same, moderate hiatal hernia   Specimens: none Retained items: none  EBL: minimal cc Complications: none   Description of procedure: After obtaining informed consent and administration of prophylactic heparin  in holding, the patient was taken to the operating room and placed supine on operating room table where general endotracheal anesthesia was initiated, preoperative antibiotics were administered, SCDs applied, and a formal timeout was performed. The abdomen was prepped and draped in usual sterile fashion. Peritoneal access was gained using a Visiport technique in the left upper quadrant and insufflation to 15 mmHg ensued without issue. Gross inspection revealed no evidence of injury. Under direct visualization the remaining trochars were inserted. A laparoscopic assisted bilateral taps block was performed using 0.25% Marcaine  with epinephrine .  The omentum was reflected cephalad and the ligament of Treitz identified. The small bowel was followed to a point 50 cm distal to ligament of Treitz at which location the bowel was divided with a white load linear cutting stapler. A Penrose was sutured to the Roux side of the staple line for future identification. The bowel was measured another 100 cm distal to this and and the site for the jejunojejunostomy was aligned with the end of the biliopancreatic limb. Enterotomies were made with the Harmonic scalpel and the anastomosis was created with the 60 mm white load linear cutting stapler. The common enterotomy was closed with  running 3-0 Vicryl starting on either end and tying centrally. The mesenteric defect was closed with running silk suture secured with Lapra-Ty's. The anastomosis was inspected and appeared widely patent, hemostatic with no gaps in the suture line. Vistaseal  was sprayed over the anastomosis. We then divided the omentum using the harmonic scalpel.  The patient was then placed in steep reverse Trendelenburg. The liver retractor was inserted through a subxiphoid incision and secured for fixed retraction of the left lobe.  On inspection of the hiatus, there was a moderate hiatal hernia and the GE junction appeared to be herniated above the diaphragm.  The pars Lucinda was divided and the right crus identified.  The crura were dissected circumferentially, separating the hernia sac from the crura beginning on the right, proceeding posteriorly and anteriorly across the midline and then finally dissecting out the left crus.  The distal esophagus was mobilized from its mediastinal attachments and we were able to achieve at least 3 cm of intra-abdominal esophageal length.  There was a small lipoma along the posterior esophagus which was bluntly dissected free and excised and discarded.  The crura were then reapproximated posteriorly with 3 simple interrupted 0 Ethibond sutures secured with ty-knots, narrowing the hiatus to about 2 cm in diameter. We then proceeded with creation of her gastric pouch.  The Harmonic scalpel was used to enter the perigastric plane and the lesser sac at a point 6 cm distal to the GE junction on the lesser curve. The angle of His was gently bluntly dissected and the target shape of the pouch visualized to exclude any residual fundus. After confirming that all tubes have been removed from the stomach, the gastric pouch was created with serial fires of the linear cutting stapler.  The Roux limb with its attached Penrose drain was then identified and brought up to meet the gastric pouch, ensuring  no twist in the small bowel mesentery. The staple line of the small bowel is directed to the patient's left side. A running 3-0 Vicryl was used to create a posterior suture line for our anastomosis between the gastric pouch and the small bowel. Gastrotomy and enterotomy was made with the Harmonic scalpel and a blue load linear cutting stapler was used to create a gastrojejunal anastomosis approximately 2.5cm wide. The common enterotomy was closed with running 3-0 Vicryl starting at either end and tying centrally. At this juncture the Ewald tube was passed through the gastrojejunal anastomosis. An anterior layer of running 3-0 Vicryl was used to complete the gastrojejunal anastomosis. The ewald tube was removed without difficulty.  The [place] space was closed with a figure-of-eight silk suture. At this point the assistant performed an upper endoscopy with the Roux limb gently clamped with a bowel clamp. Irrigation was instilled in the upper abdomen for a leak test. Please see his separate operative note- the anastomosis is noted to be patent, circumferentially intact, and viable and hemostatic without any leak or bubbles present.  The lumen of the pouch in the esophagus appeared to be free of injury although there was evidence of esophagitis from the patient's hiatal hernia and reflux.  The endoscope was removed and the abdomen once again surveyed.  Vistaseal  was sprayed over the gastrojejunostomy.  The liver retractor was removed under direct visualization. The abdomen was then desufflated and all remaining trochars removed. The skin incisions were closed with subcuticular 4-0 Monocryl; benzoin, Steri-Strips and Band-Aids were applied The patient was then awakened, extubated and taken to PACU in stable condition.     All counts were correct at the completion of the case.

## 2023-09-21 NOTE — Interval H&P Note (Signed)
 History and Physical Interval Note:  09/21/2023 8:47 AM  Brandi Dunn Shoulder  has presented today for surgery, with the diagnosis of MORBID OBESITY.  The various methods of treatment have been discussed with the patient and family. After consideration of risks, benefits and other options for treatment, the patient has consented to  Procedure(s): LAPAROSCOPIC ROUX-EN-Y GASTRIC (N/A) ENDOSCOPY, UPPER GI TRACT (N/A) as a surgical intervention.  The patient's history has been reviewed, patient examined, no change in status, stable for surgery.  I have reviewed the patient's chart and labs.  Questions were answered to the patient's satisfaction.     [name]

## 2023-09-21 NOTE — Progress Notes (Signed)
 PHARMACY CONSULT FOR:  Risk Assessment for Post-Discharge VTE Following Bariatric Surgery  Procedure* RNY Gastric Bypass  Sex F  Black race Y  Age (years) 10  BMI (kg/m2) 44.8  Operation duration (minutes) 119  History of VTE requiring treatment*   Hypercoagulable condition*   Liver disorder*   Pre-op venous stasis   Pre-op functional health status Independent   Previous foregut or bariatric surg   Post-op surgical site infection   Transfusion intra- or post-op*   Unplanned readmission   Unplanned reoperation   GI perforation/leak/obstruction*   *specific risk factors for portomesenteric venous thrombosis: no   Predicted probability of 30-day post-discharge VTE:    0.4 % estimated using the [facility] Calculator  Other patient-specific factors to consider:   Recommendation for Discharge: No pharmacologic prophylaxis post-discharge Enoxaparin  40 mg [place] q12h x 2 weeks post-discharge    Brandi Dunn is a 43 y.o. female who underwent  laparoscopic Roux-en-Y gastric bypass on 09/21/23   Case start: 1004 Case end: 1203   No Known Allergies  Patient Measurements: Height: 5' 4 (162.6 cm) Weight: 118.5 kg (261 lb 3.2 oz) IBW/kg (Calculated) : 54.7 Body mass index is 44.83 kg/m.  No results for input(s): WBC, HGB, HCT, PLT, APTT, CREATININE, LABCREA, CREAT24HRUR, MG, PHOS, ALBUMIN, PROT, AST, ALT, ALKPHOS, BILITOT, BILIDIR, IBILI in the last 72 hours. Estimated Creatinine Clearance: 97.7 mL/min (by C-G formula based on SCr of 0.94 mg/dL).    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 Placed 2022   Taking   omeprazole  (PRILOSEC) 40 MG capsule TAKE 1 CAPSULE(40 MG) BY MOUTH DAILY 30 MINUTES BEFORE BREAKFAST WITH WATER  90 capsule 3 09/21/2023 at  6:00 AM   thiamine (VITAMIN B-1) 100 MG tablet Take 100 mg by mouth in the morning.   09/21/2023 at  6:00 AM   SUMAtriptan  (IMITREX ) 50 MG tablet May take 1 in the first sign of headache may repeat 2 hours later if necessary maximum 2/day 10 tablet 0 More than a month   tirzepatide  10 MG/0.5ML injection vial Inject 10 mg into the skin once a week. 2 mL 0 More than a month      Fenna Semel Karoline Marina, PharmD, BCPS Clinical Staff Pharmacist Marina Salines [place] 09/21/2023,1:58 PM

## 2023-09-21 NOTE — Transfer of Care (Signed)
 Immediate Anesthesia Transfer of Care Note  Patient: Brandi Dunn  Procedure(s) Performed: LAPAROSCOPIC ROUX-EN-Y GASTRIC ENDOSCOPY, UPPER GI TRACT REPAIR, HERNIA, HIATAL, LAPAROSCOPIC  Patient Location: PACU  Anesthesia Type:General  Level of Consciousness: awake and alert   Airway & Oxygen Therapy: Patient Spontanous Breathing and Patient connected to nasal cannula oxygen  Post-op Assessment: Report given to RN and Post -op Vital signs reviewed and stable  Post vital signs: Reviewed and stable  Last Vitals:  Vitals Value Taken Time  BP 148/69 09/21/23 12:17  Temp    Pulse 87 09/21/23 12:21  Resp 20 09/21/23 12:21  SpO2 100 % 09/21/23 12:21  Vitals shown include unfiled device data.  Last Pain:  Vitals:   09/21/23 0829  TempSrc:   PainSc: 0-No pain         Complications: No notable events documented.

## 2023-09-21 NOTE — Anesthesia Procedure Notes (Signed)
 Procedure Name: Intubation Date/Time: 09/21/2023 9:45 AM  Performed by: Dartha Meckel, CRNAPre-anesthesia Checklist: Patient identified, Emergency Drugs available, Suction available and Patient being monitored Patient Re-evaluated:Patient Re-evaluated prior to induction Oxygen Delivery Method: Circle system utilized Preoxygenation: Pre-oxygenation with 100% oxygen Induction Type: IV induction Ventilation: Mask ventilation without difficulty Laryngoscope Size: Glidescope and 3 Grade View: Grade I Tube type: Oral Tube size: 7.0 mm Number of attempts: 1 Airway Equipment and Method: Stylet and Oral airway Placement Confirmation: ETT inserted through vocal cords under direct vision, positive ETCO2 and breath sounds checked- equal and bilateral Secured at: 22 cm Tube secured with: Tape Dental Injury: Teeth and Oropharynx as per pre-operative assessment

## 2023-09-21 NOTE — Op Note (Signed)
 Brandi Dunn [id number] [birth year] 09/21/2023  Preoperative diagnosis: severe obesity  Postoperative diagnosis: Same   Procedure: Upper endoscopy   Surgeon: Camellia CHRISTELLA Blush M.D., FACS   Anesthesia: Gen.   Indications for procedure: 43 y.o. yo female undergoing a laparoscopic roux en y gastric bypass and an upper endoscopy was requested to evaluate the anastomosis.  Description of procedure: After we have completed the new gastrojejunostomy, I scrubbed out and obtained the Olympus endoscope. I gently placed endoscope in the patient's oropharynx and gently glided it down the esophagus without any difficulty under direct visualization. Once I was in the gastric pouch, I insufflated the pouch was air. The pouch was approximately 5-6 cm in size. I was able to cannulate and advanced the scope through the gastrojejunostomy. Dr.Connor had placed saline in the upper abdomen. Upon further insufflation of the gastric pouch there was no evidence of bubbles. Upon further inspection of the gastric pouch, the mucosa appeared irritated. . The gastric pouch and Roux limb were decompressed. The width of the gastrojejunal anastomosis was at least 2.5 cm. The scope was withdrawn. The patient tolerated this portion of the procedure well. Please see Dr Donneta operative note for details regarding the laparoscopic roux-en-y gastric bypass.  Camellia CHRISTELLA. Blush, MD, FACS General, Bariatric, & Minimally Invasive Surgery [facility]

## 2023-09-22 DIAGNOSIS — E66813 Obesity, class 3: Secondary | ICD-10-CM | POA: Diagnosis not present

## 2023-09-22 LAB — CBC
HCT: 38.7 % (ref 36.0–46.0)
Hemoglobin: 12.2 g/dL (ref 12.0–15.0)
MCH: 28 pg (ref 26.0–34.0)
MCHC: 31.5 g/dL (ref 30.0–36.0)
MCV: 88.8 fL (ref 80.0–100.0)
Platelets: 242 K/uL (ref 150–400)
RBC: 4.36 MIL/uL (ref 3.87–5.11)
RDW: 13.6 % (ref 11.5–15.5)
WBC: 12.4 K/uL — ABNORMAL HIGH (ref 4.0–10.5)
nRBC: 0 % (ref 0.0–0.2)

## 2023-09-22 LAB — COMPREHENSIVE METABOLIC PANEL WITH GFR
ALT: 29 U/L (ref 0–44)
AST: 26 U/L (ref 15–41)
Albumin: 3.5 g/dL (ref 3.5–5.0)
Alkaline Phosphatase: 41 U/L (ref 38–126)
Anion gap: 10 (ref 5–15)
BUN: 6 mg/dL (ref 6–20)
CO2: 23 mmol/L (ref 22–32)
Calcium: 9 mg/dL (ref 8.9–10.3)
Chloride: 106 mmol/L (ref 98–111)
Creatinine, Ser: 0.82 mg/dL (ref 0.44–1.00)
GFR, Estimated: >60 mL/min (ref >60)
Glucose, Bld: 104 mg/dL — ABNORMAL HIGH (ref 70–99)
Potassium: 3.6 mmol/L (ref 3.5–5.1)
Sodium: 139 mmol/L (ref 135–145)
Total Bilirubin: 0.8 mg/dL (ref 0.0–1.2)
Total Protein: 6.8 g/dL (ref 6.5–8.1)

## 2023-09-22 LAB — MAGNESIUM: Magnesium: 2 mg/dL (ref 1.7–2.4)

## 2023-09-22 MED ORDER — POTASSIUM CHLORIDE 10 MEQ/100ML IV SOLN
10.0000 meq | INTRAVENOUS | Status: AC
  Administered 2023-09-22 (×4): 10 meq via INTRAVENOUS
  Filled 2023-09-22 (×4): qty 100

## 2023-09-22 NOTE — Plan of Care (Signed)
   Problem: Clinical Measurements: Goal: Diagnostic test results will improve Outcome: Progressing

## 2023-09-22 NOTE — Plan of Care (Signed)
  Problem: Education: Goal: Knowledge of General Education information will improve Description: Including pain rating scale, medication(s)/side effects and non-pharmacologic comfort measures Outcome: Adequate for Discharge   Problem: Health Behavior/Discharge Planning: Goal: Ability to manage health-related needs will improve Outcome: Adequate for Discharge   Problem: Clinical Measurements: Goal: Ability to maintain clinical measurements within normal limits will improve Outcome: Adequate for Discharge Goal: Will remain free from infection Outcome: Adequate for Discharge Goal: Diagnostic test results will improve Outcome: Adequate for Discharge Goal: Respiratory complications will improve Outcome: Adequate for Discharge Goal: Cardiovascular complication will be avoided Outcome: Adequate for Discharge   Problem: Activity: Goal: Risk for activity intolerance will decrease Outcome: Adequate for Discharge   Problem: Nutrition: Goal: Adequate nutrition will be maintained Outcome: Adequate for Discharge   Problem: Coping: Goal: Level of anxiety will decrease Outcome: Adequate for Discharge   Problem: Elimination: Goal: Will not experience complications related to bowel motility Outcome: Adequate for Discharge Goal: Will not experience complications related to urinary retention Outcome: Adequate for Discharge   Problem: Pain Managment: Goal: General experience of comfort will improve and/or be controlled Outcome: Adequate for Discharge   Problem: Safety: Goal: Ability to remain free from injury will improve Outcome: Adequate for Discharge   Problem: Skin Integrity: Goal: Risk for impaired skin integrity will decrease Outcome: Adequate for Discharge   Problem: Education: Goal: Ability to state signs and symptoms to report to health care provider will improve Outcome: Adequate for Discharge Goal: Knowledge of the prescribed self-care regimen will improve Outcome:  Adequate for Discharge Goal: Knowledge of discharge needs will improve Outcome: Adequate for Discharge   Problem: Activity: Goal: Ability to tolerate increased activity will improve Outcome: Adequate for Discharge   Problem: Bowel/Gastric: Goal: Gastrointestinal status for postoperative course will improve Outcome: Adequate for Discharge Goal: Occurrences of nausea will decrease Outcome: Adequate for Discharge   Problem: Coping: Goal: Development of coping mechanisms to deal with changes in body function or appearance will improve Outcome: Adequate for Discharge   Problem: Fluid Volume: Goal: Maintenance of adequate hydration will improve Outcome: Adequate for Discharge   Problem: Nutritional: Goal: Nutritional status will improve Outcome: Adequate for Discharge   Problem: Clinical Measurements: Goal: Will show no signs or symptoms of venous thromboembolism Outcome: Adequate for Discharge Goal: Will remain free from infection Outcome: Adequate for Discharge Goal: Will show no signs of GI Leak Outcome: Adequate for Discharge   Problem: Respiratory: Goal: Will regain and/or maintain adequate ventilation Outcome: Adequate for Discharge   Problem: Pain Management: Goal: Pain level will decrease Outcome: Adequate for Discharge   Problem: Skin Integrity: Goal: Demonstration of wound healing without infection will improve Outcome: Adequate for Discharge

## 2023-09-22 NOTE — Progress Notes (Signed)
 Spoke with patient, patient stated that she is working on her protein and is tolerating well. Patient stated that she had a migraine last night, that has improved this AM but she felt that it is starting to 'creep up again'. Will ask primary RN to provide the patient her PRN migraine relief medication prior to discharge. Patient educated about Lovenox  medication and education kit. All questions addressed, no further questions at this time.

## 2023-09-22 NOTE — TOC Initial Note (Signed)
 Transition of Care [facility]) - Initial/Assessment Note    Patient Details  Name: Brandi Dunn MRN: [id number] Date of Birth: Nov 08, 1980  Transition of Care [facility]) CM/SW Contact:    Sonda Manuella Quill, RN Phone Number: 09/22/2023, 9:31 AM  Clinical Narrative:                 Spoke w/ pt in room; pt said she lives at home w/ her spouse Dwayne Bathe ([phone]); she plans to return at d/c; he will provide transportation; pt verified insurance/PCP; she denied SDOH risks; pt does not have DME, HH services, or home oxygen; no TOC needs.  Expected Discharge Plan: Home/Self Care Barriers to Discharge: No Barriers Identified   Patient Goals and CMS Choice Patient states their goals for this hospitalization and ongoing recovery are:: home          Expected Discharge Plan and Services   Discharge Planning Services: CM Consult   Living arrangements for the past 2 months: Single Family Home Expected Discharge Date: 09/22/23               DME Arranged: N/A DME Agency: NA       HH Arranged: NA HH Agency: NA        Prior Living Arrangements/Services Living arrangements for the past 2 months: Single Family Home Lives with:: Spouse Patient language and need for interpreter reviewed:: Yes Do you feel safe going back to the place where you live?: Yes      Need for Family Participation in Patient Care: Yes (Comment) Care giver support system in place?: Yes (comment) Current home services:  (n/a) Criminal Activity/Legal Involvement Pertinent to Current Situation/Hospitalization: No - Comment as needed  Activities of Daily Living   ADL Screening (condition at time of admission) Independently performs ADLs?: Yes (appropriate for developmental age) Is the patient deaf or have difficulty hearing?: No Does the patient have difficulty seeing, even when wearing glasses/contacts?: No Does the patient have difficulty concentrating, remembering, or making decisions?: No  Permission  Sought/Granted Permission sought to share information with : Case Manager Permission granted to share information with : Yes, Verbal Permission Granted  Share Information with NAME: Case Manager     Permission granted to share info w Relationship: Dwayne Bathe (spouse) [phone]     Emotional Assessment Appearance:: Appears stated age Attitude/Demeanor/Rapport: Gracious Affect (typically observed): Accepting Orientation: : Oriented to Self, Oriented to Place, Oriented to  Time, Oriented to Situation Alcohol / Substance Use: Not Applicable Psych Involvement: No (comment)  Admission diagnosis:  Morbid obesity (HCC) [E66.01] Patient Active Problem List   Diagnosis Date Noted   Chronic diarrhea 05/31/2022   Nephrolithiasis 08/17/2021   Pancreatic cyst 06/23/2021   Morbid obesity (HCC) 01/03/2021   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 & [address] [place] [address] Phone: [phone] Fax: [phone]     Social Drivers of Health (SDOH) Social History: SDOH Screenings   Food Insecurity: No Food Insecurity (09/22/2023)  Housing: Low Risk  (09/22/2023)  Transportation Needs: No Transportation Needs (09/22/2023)  Utilities: Not At Risk (09/22/2023)  Alcohol Screen: Low Risk  (06/23/2021)  Depression (PHQ2-9): Low Risk  (07/23/2023)  Financial Resource Strain: Low Risk  (03/30/2023)  Physical Activity: Insufficiently Active (03/30/2023)  Social Connections: Socially Integrated (03/30/2023)  Stress: No Stress Concern Present (03/30/2023)  Tobacco Use: Low Risk  (09/21/2023)   SDOH Interventions: Food Insecurity  Interventions: Intervention Not Indicated, Inpatient TOC Housing Interventions: Intervention Not Indicated, Inpatient TOC Transportation Interventions: Intervention Not Indicated, Inpatient TOC Utilities Interventions: Intervention Not Indicated, Inpatient TOC   Readmission Risk Interventions     No data to display

## 2023-09-22 NOTE — Progress Notes (Signed)
 Assessment unchanged. Pt and husband verbalized understanding of dc instructions including medications, when to call the doctor as well as follow up care. Pt understands diet and fluid requirements as well as other bariatric teaching done by Zaneta yesterday. Lovenox  kit given to pt, pt states neighbor is a Engineer, civil (consulting) and she going to administer it. Discharged via foot to front entrance accompanied by husband and RN.

## 2023-09-24 ENCOUNTER — Encounter (HOSPITAL_COMMUNITY): Payer: Self-pay | Admitting: Surgery

## 2023-09-24 NOTE — Anesthesia Postprocedure Evaluation (Signed)
 Anesthesia Post Note  Patient: Brandi Dunn  Procedure(s) Performed: LAPAROSCOPIC ROUX-EN-Y GASTRIC ENDOSCOPY, UPPER GI TRACT REPAIR, HERNIA, HIATAL, LAPAROSCOPIC     Patient location during evaluation: PACU Anesthesia Type: General Level of consciousness: sedated and patient cooperative Pain management: pain level controlled Vital Signs Assessment: post-procedure vital signs reviewed and stable Respiratory status: spontaneous breathing Cardiovascular status: stable Anesthetic complications: no   No notable events documented.  Last Vitals:  Vitals:   09/22/23 0643 09/22/23 1337  BP: (!) 105/47 106/66  Pulse: 84 81  Resp: 16   Temp: 36.8 C 36.8 C  SpO2: 98% 98%    Last Pain:  Vitals:   09/22/23 1337  TempSrc: Oral  PainSc:                  Norleen Pope

## 2023-09-26 ENCOUNTER — Emergency Department (HOSPITAL_COMMUNITY)
Admission: EM | Admit: 2023-09-26 | Discharge: 2023-09-26 | Disposition: A | Discharge status: Home / Self Care | Source: Ambulatory Visit | Attending: Emergency Medicine | Admitting: Emergency Medicine

## 2023-09-26 ENCOUNTER — Emergency Department (HOSPITAL_COMMUNITY)

## 2023-09-26 ENCOUNTER — Other Ambulatory Visit: Payer: Self-pay

## 2023-09-26 ENCOUNTER — Telehealth (HOSPITAL_COMMUNITY): Payer: Self-pay | Admitting: *Deleted

## 2023-09-26 ENCOUNTER — Encounter (HOSPITAL_COMMUNITY): Payer: Self-pay

## 2023-09-26 DIAGNOSIS — R110 Nausea: Secondary | ICD-10-CM | POA: Diagnosis not present

## 2023-09-26 DIAGNOSIS — R0602 Shortness of breath: Secondary | ICD-10-CM | POA: Diagnosis present

## 2023-09-26 DIAGNOSIS — G8918 Other acute postprocedural pain: Secondary | ICD-10-CM | POA: Insufficient documentation

## 2023-09-26 DIAGNOSIS — R1013 Epigastric pain: Secondary | ICD-10-CM | POA: Insufficient documentation

## 2023-09-26 DIAGNOSIS — R0789 Other chest pain: Secondary | ICD-10-CM | POA: Insufficient documentation

## 2023-09-26 LAB — CBC WITH DIFFERENTIAL/PLATELET
Abs Immature Granulocytes: 0.04 K/uL (ref 0.00–0.07)
Basophils Absolute: 0 K/uL (ref 0.0–0.1)
Basophils Relative: 0 %
Eosinophils Absolute: 0.3 K/uL (ref 0.0–0.5)
Eosinophils Relative: 3 %
HCT: 41.8 % (ref 36.0–46.0)
Hemoglobin: 13.5 g/dL (ref 12.0–15.0)
Immature Granulocytes: 0 %
Lymphocytes Relative: 24 %
Lymphs Abs: 2.2 K/uL (ref 0.7–4.0)
MCH: 27.8 pg (ref 26.0–34.0)
MCHC: 32.3 g/dL (ref 30.0–36.0)
MCV: 86 fL (ref 80.0–100.0)
Monocytes Absolute: 0.5 K/uL (ref 0.1–1.0)
Monocytes Relative: 5 %
Neutro Abs: 6.2 K/uL (ref 1.7–7.7)
Neutrophils Relative %: 68 %
Platelets: 302 K/uL (ref 150–400)
RBC: 4.86 MIL/uL (ref 3.87–5.11)
RDW: 13.3 % (ref 11.5–15.5)
WBC: 9.1 K/uL (ref 4.0–10.5)
nRBC: 0 % (ref 0.0–0.2)

## 2023-09-26 LAB — COMPREHENSIVE METABOLIC PANEL WITH GFR
ALT: 32 U/L (ref 0–44)
AST: 15 U/L (ref 15–41)
Albumin: 3.9 g/dL (ref 3.5–5.0)
Alkaline Phosphatase: 53 U/L (ref 38–126)
Anion gap: 13 (ref 5–15)
BUN: 11 mg/dL (ref 6–20)
CO2: 20 mmol/L — ABNORMAL LOW (ref 22–32)
Calcium: 9.5 mg/dL (ref 8.9–10.3)
Chloride: 103 mmol/L (ref 98–111)
Creatinine, Ser: 0.93 mg/dL (ref 0.44–1.00)
GFR, Estimated: >60 mL/min (ref >60)
Glucose, Bld: 79 mg/dL (ref 70–99)
Potassium: 3.8 mmol/L (ref 3.5–5.1)
Sodium: 136 mmol/L (ref 135–145)
Total Bilirubin: 0.8 mg/dL (ref 0.0–1.2)
Total Protein: 7.9 g/dL (ref 6.5–8.1)

## 2023-09-26 LAB — TROPONIN I (HIGH SENSITIVITY): Troponin I (High Sensitivity): 3 ng/L (ref <18)

## 2023-09-26 LAB — PROTIME-INR
INR: 1 (ref 0.8–1.2)
Prothrombin Time: 13.5 s (ref 11.4–15.2)

## 2023-09-26 MED ORDER — IOHEXOL 350 MG/ML SOLN
75.0000 mL | Freq: Once | INTRAVENOUS | Status: AC | PRN
  Administered 2023-09-26: 75 mL via INTRAVENOUS

## 2023-09-26 MED ORDER — ALUM & MAG HYDROXIDE-SIMETH 200-200-20 MG/5ML PO SUSP
30.0000 mL | Freq: Once | ORAL | Status: AC
  Administered 2023-09-26: 30 mL via ORAL
  Filled 2023-09-26: qty 30

## 2023-09-26 MED ORDER — ENSURE MAX PROTEIN PO LIQD
11.0000 [oz_av] | Freq: Every day | ORAL | Status: DC
  Administered 2023-09-26: 11 [oz_av] via ORAL
  Filled 2023-09-26 (×2): qty 330

## 2023-09-26 MED ORDER — FAMOTIDINE 20 MG PO TABS
40.0000 mg | ORAL_TABLET | Freq: Once | ORAL | Status: AC
  Administered 2023-09-26: 40 mg via ORAL
  Filled 2023-09-26: qty 2

## 2023-09-26 NOTE — ED Provider Notes (Signed)
 [place] EMERGENCY DEPARTMENT AT [facility] Provider Note   CSN: [id number] Arrival date & time: 09/26/23  1700     Patient presents with: Shortness of Breath and Post-op Problem   Brandi Dunn is a 43 y.o. female.  {Add pertinent medical, surgical, social history, OB history to HPI:32947} Patient is a 43 year old female postop day 5 laparoscopic Roux-en-Y by Dr. Mitzie Freund presenting for complaints of shortness of breath.  Patient admits to chest tightness, intermittent-random shortness of breath feelings like she cannot take a deep breath with radiation to the shoulder blades that have been occurring over the past 24 hours.  She denies any coughing.  She admits to minimal intermittent epigastric abdominal pain with intermittent nausea without vomiting.  She was given a reflux medication but has not taken it at this time.  She is currently taking thick liquid protein shakes.  She is having normal bowel movements with her last bowel movement this morning.  The history is provided by the patient. No language interpreter was used.  Shortness of Breath Associated symptoms: abdominal pain   Associated symptoms: no chest pain, no cough, no ear pain, no fever, no rash, no sore throat and no vomiting        Prior to Admission medications   Medication Sig Start Date End Date Taking? Authorizing Provider  acetaminophen  (TYLENOL ) 500 MG tablet Take 2 tablets (1,000 mg total) by mouth every 8 (eight) hours for 5 days. 09/21/23 09/26/23  Freund Mitzie LABOR, MD  docusate sodium  (COLACE) 100 MG capsule Take 1 capsule (100 mg total) by mouth 2 (two) times daily. Okay to decrease to once daily or stop taking if having loose bowel movements 09/21/23 10/21/23  Freund Mitzie LABOR, MD  enoxaparin  (LOVENOX ) 40 MG/0.4ML injection Inject 0.4 mLs (40 mg total) into the skin 2 (two) times daily for 14 days. 09/22/23 10/06/23  Freund Mitzie LABOR, MD  gabapentin  (NEURONTIN ) 100 MG capsule Take 1  capsule (100 mg total) by mouth every 12 (twelve) hours for 5 days. 09/21/23 09/26/23  Freund Mitzie LABOR, MD  levonorgestrel  (MIRENA ) 20 MCG/24HR IUD 1 each by Intrauterine route once. Placed 2022    [provider]  ondansetron  (ZOFRAN -ODT) 4 MG disintegrating tablet Take 1 tablet (4 mg total) by mouth every 6 (six) hours as needed for nausea or vomiting. 09/21/23   Freund Mitzie LABOR, MD  pantoprazole  (PROTONIX ) 40 MG tablet Take 1 tablet (40 mg total) by mouth daily. Take this medication daily, regardless of whether you are having any reflux symptoms 09/21/23   Freund Mitzie LABOR, MD  SUMAtriptan  (IMITREX ) 50 MG tablet May take 1 in the first sign of headache may repeat 2 hours later if necessary maximum 2/day 12/22/22   Luking, Glendia LABOR, MD  thiamine (VITAMIN B-1) 100 MG tablet Take 100 mg by mouth in the morning.    [provider]  tirzepatide  10 MG/0.5ML injection vial Inject 10 mg into the skin once a week. 09/01/23   Mauro Elveria BROCKS, NP  traMADol  (ULTRAM ) 50 MG tablet Take 1 tablet (50 mg total) by mouth every 6 (six) hours as needed for moderate pain (pain score 4-6) or severe pain (pain score 7-10). 09/21/23   Freund Mitzie LABOR, MD    Allergies: Patient has no known allergies.    Review of Systems  Constitutional:  Negative for chills and fever.  HENT:  Negative for ear pain and sore throat.   Eyes:  Negative for pain and visual disturbance.  Respiratory:  Positive for chest tightness and shortness of breath. Negative for cough.   Cardiovascular:  Negative for chest pain and palpitations.  Gastrointestinal:  Positive for abdominal pain and nausea. Negative for vomiting.  Genitourinary:  Negative for dysuria and hematuria.  Musculoskeletal:  Negative for arthralgias and back pain.  Skin:  Negative for color change and rash.  Neurological:  Negative for seizures and syncope.  All other systems reviewed and are negative.   Updated Vital Signs BP (!) 140/84   Pulse (!)  118   Temp 98.8 F (37.1 C)   Resp 20   Ht 5' 4 (1.626 m)   Wt 117.9 kg   LMP 09/14/2023 (Exact Date) Comment: negative UPT as of 09/21/23  SpO2 99%   BMI 44.63 kg/m   Physical Exam Vitals and nursing note reviewed.  Constitutional:      General: She is not in acute distress.    Appearance: She is well-developed.  HENT:     Head: Normocephalic and atraumatic.  Eyes:     Conjunctiva/sclera: Conjunctivae normal.  Cardiovascular:     Rate and Rhythm: Normal rate and regular rhythm.     Heart sounds: No murmur heard. Pulmonary:     Effort: Pulmonary effort is normal. No respiratory distress.     Breath sounds: Normal breath sounds.  Abdominal:     Palpations: Abdomen is soft.     Tenderness: There is no abdominal tenderness.  Musculoskeletal:        General: No swelling.     Cervical back: Neck supple.  Skin:    General: Skin is warm and dry.     Capillary Refill: Capillary refill takes less than 2 seconds.  Neurological:     Mental Status: She is alert.  Psychiatric:        Mood and Affect: Mood normal.     (all labs ordered are listed, but only abnormal results are displayed) Labs Reviewed  COMPREHENSIVE METABOLIC PANEL WITH GFR - Abnormal; Notable for the following components:      Result Value   CO2 20 (*)    All other components within normal limits  CBC WITH DIFFERENTIAL/PLATELET  PROTIME-INR  HCG, SERUM, QUALITATIVE  TROPONIN I (HIGH SENSITIVITY)  TROPONIN I (HIGH SENSITIVITY)    EKG: EKG Interpretation Date/Time:  Wednesday September 26 2023 17:22:44 EDT Ventricular Rate:  113 PR Interval:  128 QRS Duration:  67 QT Interval:  328 QTC Calculation: 450 R Axis:   72  Text Interpretation: Sinus tachycardia Atrial premature complex Confirmed by Elnor Hila ([id number]) on 09/26/2023 9:04:23 PM  Radiology: CT Angio Chest PE W and/or Wo Contrast Result Date: 09/26/2023 CLINICAL DATA:  High probability for PE. Gastric bypass 816. Shortness of breath and chest  pain. EXAM: CT ANGIOGRAPHY CHEST WITH CONTRAST TECHNIQUE: Multidetector CT imaging of the chest was performed using the standard protocol during bolus administration of intravenous contrast. Multiplanar CT image reconstructions and MIPs were obtained to evaluate the vascular anatomy. RADIATION DOSE REDUCTION: This exam was performed according to the departmental dose-optimization program which includes automated exposure control, adjustment of the mA and/or kV according to patient size and/or use of iterative reconstruction technique. CONTRAST:  75mL OMNIPAQUE  IOHEXOL  350 MG/ML SOLN COMPARISON:  None Available. FINDINGS: Cardiovascular: Satisfactory opacification of the pulmonary arteries to the segmental level. No evidence of pulmonary embolism. Normal heart size. No pericardial effusion. Mediastinum/Nodes: No enlarged lymph nodes are seen. Visualized thyroid  gland is within normal limits. The esophagus is nondilated. There some  wall thickening of the distal esophagus with mild inflammatory stranding which may be related to recent surgery. No pneumomediastinum or mediastinal fluid collection. Lungs/Pleura: There are trace bilateral pleural effusions. The lungs are otherwise clear. There is no pneumothorax. Upper Abdomen: Gastric bypass changes are present. Musculoskeletal: Review of the MIP images confirms the above findings. Electronically Signed   By: Greig Pique M.D.   On: 09/26/2023 18:58    {Document cardiac monitor, telemetry assessment procedure when appropriate:32947} Procedures   Medications Ordered in the ED  protein supplement (ENSURE MAX) liquid (has no administration in time range)  iohexol  (OMNIPAQUE ) 350 MG/ML injection 75 mL (75 mLs Intravenous Contrast Given 09/26/23 1833)  famotidine  (PEPCID ) tablet 40 mg (40 mg Oral Given 09/26/23 2146)  alum & mag hydroxide-simeth (MAALOX/MYLANTA) 200-200-20 MG/5ML suspension 30 mL (30 mLs Oral Given 09/26/23 2146)      {Click here for ABCD2, HEART  and other calculators REFRESH Note before signing:1}                              Medical Decision Making Risk OTC drugs.    43 year old female postop day 5 laparoscopic Roux-en-Y by Dr. Mitzie Freund presenting for complaints of shortness of breath.  Patient admits to chest tightness, intermittent-random shortness of breath feelings like she cannot take a deep breath with radiation to the shoulder blades that have been occurring over the past 24 hours.  Patient is alert oriented x 3, no acute distress, afebrile, stable vital signs.  She has equal bilateral breath sounds with no adventitious lung sounds.  No reproducible epigastric abdominal pain on exam.    Differential diagnosis for chest tightness and shortness of breath post Roux-en-Y includes but is not limited to pulmonary embolism, pneumonia, ACS, anemia, surgical abnormalities, electrolyte deficiencies, etc.    Her EKG demonstrates sinus tachycardia with a rate of 113 bpm.  No ST segment elevation or depression.  No concerning T wave inversions.  Stable intervals.  Her troponin is within normal limits.  Low suspicion ACS.  CT pulmonary embolism study demonstrates no pulmonary emboli.  No pneumonia.  Trace bilateral pleural effusions only.  No pneumothorax.  Upper abdomen was imaged and demonstrates gastric bypass changes that are stable.  Pepcid  and Maalox given to patient in case symptoms are GERD related.  Her electrolytes are stable including her sodium, potassium, and magnesium.  She has stable kidney function.  No leukocytosis or signs of sepsis or infection at this time.  Patient stable for discharge at this time with recommendations for follow-up with PCP and/or Dr. Cameron if symptoms continue. Patient in no distress and overall condition improved here in the ED. Detailed discussions were had with the patient regarding current findings, and need for close f/u with PCP or on call doctor. The patient has been instructed to return  immediately if the symptoms worsen in any way for re-evaluation. Patient verbalized understanding and is in agreement with current care plan. All questions answered prior to discharge.      {Document critical care time when appropriate  Document review of labs and clinical decision tools ie CHADS2VASC2, etc  Document your independent review of radiology images and any outside records  Document your discussion with family members, caretakers and with consultants  Document social determinants of health affecting pt's care  Document your decision making why or why not admission, treatments were needed:32947:::1}   Final diagnoses:  None    ED Discharge  Orders     None

## 2023-09-26 NOTE — ED Provider Triage Note (Signed)
 Emergency Medicine Provider Triage Evaluation Note  Brandi Dunn , a 43 y.o. female  was evaluated in triage.  Pt complains of 2 day Hx of chest pain, worse with deep breathing and accompanied with shortness of breath. 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 Been taking Lovenox  with no missed doses.   Endorses dry cough, headache, intermittent epigastric pain accompanied with nausea  Denies fever, hemoptysis, v/d, LE swelling, unilateral weakness.  Review of Systems  Positive: N/a Negative: N/a  Physical Exam  BP (!) 140/81   Pulse (!) 118   Temp 98.8 F (37.1 C)   Resp 16   Ht 5' 4 (1.626 m)   Wt 117.9 kg   LMP 09/14/2023 (Exact Date) Comment: negative UPT as of 09/21/23  SpO2 100%   BMI 44.63 kg/m  Gen:   Awake, no distress   Resp:  Normal effort  MSK:   Moves extremities without difficulty  Other:    Medical Decision Making  Medically screening exam initiated at 5:26 PM.  Appropriate orders placed.  Brandi Dunn was informed that the remainder of the evaluation will be completed by another provider, this initial triage assessment does not replace that evaluation, and the importance of remaining in the ED until their evaluation is complete.     Brandi Dunn, [place] 09/26/23 1730

## 2023-09-26 NOTE — ED Triage Notes (Signed)
 Pt had gastric bypass 8/15 and started having pressure in shoulders and across chest and sob that started yesterday. Pt has been doing lovenox  injections at home since surgery.

## 2023-09-26 NOTE — Telephone Encounter (Signed)
 1. Tell me about your pain and pain management?     Pt reports feeling out of breath and bloated. Patient stated it hurts when she takes a deep breath and now has a tightness feeling in the shoulder. Pt instructed to go to ED if s/s continue, worsen or feeling concerned and get in touch with provider office again. Patient stated that she is going to head on to the ED.   2. Let's talk about fluid intake. How much total fluid are you taking in?   Pt states that s/he is working to meet goal of 64 oz of fluid today. Pt has not reported any s/s of dehydration. Pt plans to increase clear liquids to meet fluid goals.    3. How much protein have you taken in the last day?   Pt states that she is working to meet the goal of 60g of protein today. Pt plans to drink the reminder of goal throughout the day to meet criteria.    4. Have you had nausea? Tell me about when you have experienced nausea and what you did to help?   Pt denies nausea.   5. Has the frequency or color changed with your urine?   Pt states that s/he is urinating fine with no changes in frequency or urgency.    7. Have you been passing gas? BM?   Pt states that they are having BMs.   Pt states that they have had a BM. Pt instructed to take either Miralax or MoM as instructed per Gastric Bypass/Sleeve Discharge Home Care Instructions. Pt to call surgeon's office if not able to have BM with medication.    8. If a problem or question were to arise who would you call? Do you know contact numbers for [facility], [facility], and [facility]?   Pt knows to call [facility] for surgical, [facility] for nutrition, and [facility] for non-urgent questions or concerns. Pt denies dehydration symptoms. Pt can describe s/sx of dehydration.   9. How has the walking going?   Pt states she has had to take a break while walking in order to complete the full walk.   10. Are you still using your incentive spirometer? If so, how often?   Pt states that s/he is doing the I.S. Pt  encouraged to use incentive spirometer, encouraged to do so at least 10x every hour while awake until s/he sees the surgeon.   11. How are your vitamins and calcium going? How are you taking them?    Pt states that s/he is taking his/her supplements and vitamins without difficulty.   12. How has the anticoagulant Lovenox  been going?   LOVENOX : Pt states that s/he is taking the Lovenox  injections without difficulty. Reinforced education about taking injections q12h and rotating injection sites. Pt also instructed to monitor for unusual bruising and/or signs of bleeding.    Due to the s/s the patient reported -- I notified the surgeon Dr. Signe via txt message. Dr. Signe acknowledged, and shared that if the s/s continue or worsen to go to the ED. Relayed the message to the patient. Patient acknowledged and verbalized understanding.

## 2023-09-26 NOTE — Discharge Instructions (Addendum)
 Please follow-up with your Roux-en-Y specialist and/or your primary care physician if you continue to have symptoms of chest tightness and shortness of breath.  Please continue to walk daily and stay active.  Please start taking your acid reflux medications prescribed by your team.  Take Tylenol  as needed for possible postoperative pain radiating to the chest and resulting in shortness of breath.  Use your spirometry device every 4 hours over the next week to help improve lung function.  Return to the emergency department if symptoms significantly worsen or change in any way.

## 2023-10-02 ENCOUNTER — Encounter: Attending: Surgery | Admitting: Dietician

## 2023-10-02 ENCOUNTER — Encounter: Payer: Self-pay | Admitting: Dietician

## 2023-10-02 DIAGNOSIS — E669 Obesity, unspecified: Secondary | ICD-10-CM | POA: Insufficient documentation

## 2023-10-02 NOTE — Progress Notes (Signed)
 2 Week Post-Operative Nutrition Class   Patient was seen on 10/02/2023 for Post-Operative Nutrition education at [facility]    Surgery date: 09/21/2023 Surgery type: RYGB  Anthropometrics  Start weight at [facility]: 265.5 lbs (date: 07/23/2023)  Height: 64 in Weight today: pt declined   Clinical   Pharmacotherapy: History of weight loss medication used: zepbound   Medical hx: GERD, obesity, zepbound  (has it but has not taken it) Medications: omeprazole ; vitamin D  Labs: vit D 17.4; A1c 5.7; LDL 108 Notable signs/symptoms: none noted Any previous deficiencies? No Bowel Habits: Every day to every other day no complaints  Pt states she is having nausea and vomiting for the past 2 days, stating she has an appointment with PA at the surgeons office after this class today.  The following the learning objectives were met by the patient during this course: Identifies Soft Prepped Plan Advancement Guide  Identifies Soft, High Proteins (Phase 1), beginning 2 weeks post-operatively to 3 weeks post-operatively Identifies Additional Soft High Proteins, soft non-starchy vegetables, fruits and starches (Phase 2), beginning 3 weeks post-operatively to 3 months post-operatively Identifies appropriate sources of fluids, proteins, vegetables, fruits and starches Identifies appropriate fat sources and healthy verses unhealthy fat types   States protein, vegetable, fruit and starch recommendations and appropriate sources post-operatively Identifies the need for appropriate texture modifications, mastication, and bite sizes when consuming solids Identifies appropriate fat consumption and sources Identifies appropriate multivitamin and calcium sources post-operatively Describes the need for physical activity post-operatively and will follow MD recommendations States when to call healthcare provider regarding medication questions or post-operative complications   Handouts  given during class include: Soft Prepped Plan Advancement Guide   Follow-Up Plan: Patient will follow-up at [facility] in 10 weeks for 3 month post-op nutrition visit for diet advancement per MD.

## 2023-10-03 ENCOUNTER — Encounter: Attending: Student | Admitting: Emergency Medicine

## 2023-10-03 ENCOUNTER — Other Ambulatory Visit: Payer: Self-pay

## 2023-10-03 VITALS — BP 113/76 | HR 86 | Temp 97.4°F | Resp 16

## 2023-10-03 DIAGNOSIS — E860 Dehydration: Secondary | ICD-10-CM

## 2023-10-03 MED ORDER — THIAMINE HCL 100 MG/ML IJ SOLN
Freq: Once | INTRAVENOUS | Status: AC
  Filled 2023-10-03: qty 1000

## 2023-10-03 MED ORDER — SODIUM CHLORIDE 0.9 % IV BOLUS
1000.0000 mL | Freq: Once | INTRAVENOUS | Status: AC
  Administered 2023-10-03: 1000 mL via INTRAVENOUS
  Filled 2023-10-03: qty 1000

## 2023-10-03 NOTE — Progress Notes (Signed)
 Diagnosis: Dehydration  Provider:  PUJA GOSAI MACZIS, PA   Procedure: IV Infusion  IV Type: Peripheral, IV Location: R Antecubital  Banana Bag Dose: 1000 ml   Infusion Start Time: 1219  Infusion Stop Time: 1327  Normal Saline 1000mLs  Infusion Start Time: 1112 Infusion Stop Time: 1215  Post Infusion IV Care: Peripheral IV Discontinued  Discharge: Condition: Good, Destination: Home . AVS Declined  Performed by:  Delon ONEIDA Officer, RN

## 2023-10-09 ENCOUNTER — Telehealth (HOSPITAL_COMMUNITY): Payer: Self-pay | Admitting: *Deleted

## 2023-10-09 ENCOUNTER — Telehealth: Payer: Self-pay | Admitting: Skilled Nursing Facility1

## 2023-10-09 ENCOUNTER — Other Ambulatory Visit: Payer: Self-pay

## 2023-10-09 NOTE — Telephone Encounter (Signed)
 RD called pt to verify fluid intake once starting soft, solid proteins 2 week post-bariatric surgery.    Daily Fluid intake: pt states she thinks she is getting in but finds it difficult to get it down feeling full states she is getting in about 30 ounces  Daily Protein intake:  Bowel Habits:    Concerns/issues:   Pt states she had to get off the call to take another call; advised pt she can call the office back when she is ready

## 2023-10-09 NOTE — Telephone Encounter (Signed)
 Pt reported feeling HR racing. Said she did reach out to [facility] office and made them aware.   The patient reported still having dehydration symptoms and has only able to get about 30oz of wate. She has tried to eat protein nonetheless has incorporated at least 1 protein shake per day (30g) to work towards protein goals.  Pt stated she still felt nauseas and had vomiting despite her nausea medication -- may benefit from a different nausea medication to help.  Mentioned to pt to keep in contact with [facility] office due to symptoms and this RN called [facility] offfice following this encounter to share above mentioned comments. Patient acknowledged and staff receptive when contacted.

## 2023-10-10 ENCOUNTER — Encounter: Attending: Surgery | Admitting: Internal Medicine

## 2023-10-10 VITALS — BP 114/78 | HR 82 | Temp 97.8°F | Resp 18

## 2023-10-10 DIAGNOSIS — E860 Dehydration: Secondary | ICD-10-CM | POA: Diagnosis not present

## 2023-10-10 DIAGNOSIS — Z9884 Bariatric surgery status: Secondary | ICD-10-CM | POA: Insufficient documentation

## 2023-10-10 MED ORDER — SODIUM CHLORIDE 0.9 % IV BOLUS
2000.0000 mL | Freq: Once | INTRAVENOUS | Status: AC
  Administered 2023-10-10: 2000 mL via INTRAVENOUS
  Filled 2023-10-10: qty 2000

## 2023-10-10 NOTE — Progress Notes (Signed)
 Diagnosis: Dehydration  Provider:  Tonja Barban Maczis, PA-c  Procedure: IV Infusion  IV Type: Peripheral, IV Location: R Antecubital  Normal Saline, Dose:2000 ML  Infusion Start Time: 1311  Infusion Stop Time: 1551  Post Infusion IV Care: Observation period completed  Discharge: Condition: Good, Destination: Home . AVS Provided  Performed by:  Blanca Selinda SAUNDERS, LPN

## 2023-10-15 ENCOUNTER — Other Ambulatory Visit: Payer: Self-pay | Admitting: Student

## 2023-10-15 DIAGNOSIS — Z9884 Bariatric surgery status: Secondary | ICD-10-CM | POA: Insufficient documentation

## 2023-10-26 ENCOUNTER — Other Ambulatory Visit (HOSPITAL_COMMUNITY): Payer: Self-pay | Admitting: Family Medicine

## 2023-10-26 ENCOUNTER — Encounter (INDEPENDENT_AMBULATORY_CARE_PROVIDER_SITE_OTHER): Admitting: Emergency Medicine

## 2023-10-26 VITALS — BP 126/86 | HR 75 | Temp 98.2°F | Resp 18

## 2023-10-26 DIAGNOSIS — R928 Other abnormal and inconclusive findings on diagnostic imaging of breast: Secondary | ICD-10-CM

## 2023-10-26 DIAGNOSIS — E860 Dehydration: Secondary | ICD-10-CM | POA: Diagnosis not present

## 2023-10-26 DIAGNOSIS — Z9884 Bariatric surgery status: Secondary | ICD-10-CM

## 2023-10-26 MED ORDER — SODIUM CHLORIDE 0.9 % IV BOLUS
1000.0000 mL | Freq: Once | INTRAVENOUS | Status: AC
  Administered 2023-10-26: 1000 mL via INTRAVENOUS
  Filled 2023-10-26: qty 1000

## 2023-10-26 MED ORDER — THIAMINE HCL 100 MG/ML IJ SOLN
Freq: Once | INTRAVENOUS | Status: AC
  Filled 2023-10-26: qty 1000

## 2023-10-26 NOTE — Progress Notes (Signed)
 Diagnosis: Dehydration  Provider:  PUJA GOSAI MACZIS, PA   Procedure: IV Infusion  IV Type: Peripheral, IV Location: R Antecubital  Banana Bag, Dose: 1000 ml Normal Saline 1000 mLs  Infusion Start Time: 1031  Infusion Stop Time: 1237  Post Infusion IV Care: Peripheral IV Discontinued  Discharge: Condition: Good, Destination: Home . AVS Declined  Performed by:  Delon ONEIDA Officer, RN

## 2023-11-06 ENCOUNTER — Ambulatory Visit (HOSPITAL_COMMUNITY)
Admission: RE | Admit: 2023-11-06 | Discharge: 2023-11-06 | Disposition: A | Discharge status: Home / Self Care | Source: Ambulatory Visit | Attending: Family Medicine | Admitting: Family Medicine

## 2023-11-06 ENCOUNTER — Other Ambulatory Visit (HOSPITAL_COMMUNITY): Payer: Self-pay | Admitting: Family Medicine

## 2023-11-06 DIAGNOSIS — R928 Other abnormal and inconclusive findings on diagnostic imaging of breast: Secondary | ICD-10-CM | POA: Diagnosis present

## 2023-11-09 ENCOUNTER — Encounter: Attending: Student | Admitting: Emergency Medicine

## 2023-11-09 VITALS — BP 124/81 | HR 76 | Temp 98.2°F | Resp 16

## 2023-11-09 DIAGNOSIS — Z9884 Bariatric surgery status: Secondary | ICD-10-CM | POA: Insufficient documentation

## 2023-11-09 DIAGNOSIS — E860 Dehydration: Secondary | ICD-10-CM | POA: Diagnosis not present

## 2023-11-09 MED ORDER — THIAMINE HCL 100 MG/ML IJ SOLN
Freq: Once | INTRAVENOUS | Status: AC
  Filled 2023-11-09: qty 1000

## 2023-11-09 MED ORDER — SODIUM CHLORIDE 0.9 % IV BOLUS
1000.0000 mL | Freq: Once | INTRAVENOUS | Status: AC
  Administered 2023-11-09: 1000 mL via INTRAVENOUS
  Filled 2023-11-09: qty 1000

## 2023-11-09 NOTE — Progress Notes (Signed)
 Diagnosis: Dehydration  Provider:  TONJA BARBAN MACZIS, PA   Procedure: IV Infusion  IV Type: Peripheral, IV Location: R Antecubital  Banana Bag, Dose: 1000 ml Normal Saline 1000mLs  Infusion Start Time: 1034  Infusion Stop Time: 1250  Post Infusion IV Care: Peripheral IV Discontinued  Discharge: Condition: Good, Destination: Home . AVS Provided  Performed by:  Delon ONEIDA Officer, RN

## 2023-11-23 ENCOUNTER — Ambulatory Visit

## 2023-11-26 ENCOUNTER — Encounter (INDEPENDENT_AMBULATORY_CARE_PROVIDER_SITE_OTHER): Admitting: Emergency Medicine

## 2023-11-26 VITALS — BP 142/65 | HR 73 | Temp 97.6°F | Resp 16

## 2023-11-26 DIAGNOSIS — E860 Dehydration: Secondary | ICD-10-CM | POA: Diagnosis not present

## 2023-11-26 DIAGNOSIS — Z9884 Bariatric surgery status: Secondary | ICD-10-CM

## 2023-11-26 MED ORDER — SODIUM CHLORIDE 0.9 % IV BOLUS
1000.0000 mL | Freq: Once | INTRAVENOUS | Status: AC
  Administered 2023-11-26: 1000 mL via INTRAVENOUS
  Filled 2023-11-26: qty 1000

## 2023-11-26 MED ORDER — THIAMINE HCL 100 MG/ML IJ SOLN
Freq: Once | INTRAVENOUS | Status: AC
  Filled 2023-11-26: qty 1000

## 2023-11-26 NOTE — Progress Notes (Signed)
 Diagnosis: Dehydration  Provider:  TONJA BARBAN MACZIS, PA   Procedure: IV Infusion  IV Type: Peripheral, IV Location: R Antecubital  Banana Bag, Dose: 1000 ml Normal Saline 1000 mLs   Infusion Start Time: 1042  Infusion Stop Time: 1247  Post Infusion IV Care: Peripheral IV Discontinued  Discharge: Condition: Good, Destination: Home . AVS Provided  Performed by:  Delon ONEIDA Officer, RN

## 2023-11-30 NOTE — Telephone Encounter (Signed)
 Brandi Dunn

## 2023-12-20 ENCOUNTER — Encounter: Payer: Self-pay | Admitting: Dietician

## 2023-12-20 ENCOUNTER — Encounter: Attending: Surgery | Admitting: Dietician

## 2023-12-20 VITALS — Ht 64.0 in | Wt 225.8 lb

## 2023-12-20 DIAGNOSIS — E669 Obesity, unspecified: Secondary | ICD-10-CM | POA: Insufficient documentation

## 2023-12-20 NOTE — Progress Notes (Addendum)
 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 No Bowel Habits: Every day to every other day no complaints  Body Composition Scale Date  Weight  lbs 225.8  Total Body Fat  % 42.6     Visceral Fat 13  Fat-Free Mass  % 57.3     Total Body Water   % 43.1     Muscle-Mass  lbs 31.7  BMI 38.6  Body Fat Displacement ---        Torso  lbs 59.6        Left Leg  lbs 11.9        Right Leg  lbs 11.9        Left Arm  lbs 5.9        Right Arm  lbs 5.9    Lifestyle & Dietary Hx Pt states she has been nauseas for awhile now, stating nothing specific is causing nausea, but mostly in the morning. Pt states she started taking her multivitamin in the evening with food, stating it has eased up some. Pt states she takes a nausea pill, but states it doesn't work. Pt states she would like to start the BELT program. Pt states she takes a stool softener every day, stating Miralax did not work.  Estimated daily fluid intake: less than 64 oz (about 45 oz per day) Estimated daily protein intake: 60 g Supplements: multivitamin and calcium Current average weekly physical activity: walking or gym (treadmill/elliptical) 3 days per week, for 60 minutes at least   24-Hr Dietary Recall First Meal: up at 5 am and eats at 9:30 (boiled egg or smoothie with fruit and protein powder) Snack: trail mix Protein Mix with nuts seed and dried fruit  Second Meal: tuna salad or chicken salad or baked fish or left overs Snack: protein  shake  Third Meal: meat and two vegetables (non-starchy and starchy)... sometimes just have room for the meat Snack: fruit (cantaulope) or protein trail mix Beverages: water , Gatorade Zero  Post-Op Goals/ Signs/ Symptoms Using straws: no Drinking while eating: no Chewing/swallowing difficulties: no Changes in vision: no Changes to mood/headaches: no Hair loss/changes to skin/nails: no Difficulty focusing/concentrating: no Sweating: no Limb weakness: no Dizziness/lightheadedness: no Palpitations: no  Carbonated/caffeinated beverages: no N/V/D/C/Gas: Pt states she has been nauseas for awhile now, stating nothing specific is causing nausea, but mostly in the morning. Pt states she started taking her multivitamin in the evening with food, stating it has eased up some. Pt states she takes a nausea pill, but states it doesn't work. Abdominal pain: no Dumping syndrome: no   NUTRITION DIAGNOSIS  Overweight/obesity ([place]-3.3) related to past poor dietary habits and physical inactivity as evidenced by completed bariatric surgery and following dietary guidelines for continued weight loss and healthy nutrition status.   NUTRITION INTERVENTION Nutrition counseling (C-1) and education (E-2) to facilitate bariatric surgery goals, including: Diet advancement to the standard prep plan The importance of consuming adequate calories as well as certain nutrients daily due to the body's need for essential vitamins, minerals, and fats The  importance of daily physical activity and to reach a goal of at least 150 minutes of moderate to vigorous physical activity weekly (or as directed by their physician) due to benefits such as increased musculature and improved lab values The importance of intuitive eating specifically learning hunger-satiety cues and understanding the importance of learning a new body: The importance of mindful eating to avoid grazing behaviors  Ginger tea may provide gentle relief and  hydration when the stomach is sensitive. Ginger tea and other herbal teas can be a gentle way to ease nausea, but one of their biggest benefits after bariatric surgery is hydration. Staying well-hydrated is critical for recovery, energy, and preventing complications like kidney stones or dizziness. Herbal teas count toward your daily fluid intake, offering variety beyond plain water . Ginger tea, peppermint tea, or chamomile tea not only soothe the stomach but also help you meet hydration goals in a flavorful, low-calorie way. Sipping slowly throughout the day ensures fluids are absorbed comfortably without overwhelming the stomach. Resistance exercises are critically important long term after bariatric surgery because they help preserve and build lean muscle mass, which can be lost during rapid weight reduction. Maintaining muscle supports a healthy metabolism, making it easier to sustain weight loss over time. Strength training also improves bone density, joint stability, and overall physical function, reducing the risk of osteoporosis and frailty. Beyond physical health, resistance exercise enhances confidence, energy, and quality of life by increasing strength and endurance for daily activities.  Goals Try ginger tea for nausea Increase physical activity; add in resistance activity 2 days per week; consider BELT with your schedule  Handouts Provided Include  Standard Prep Plan Advancement Guide  Learning Style & Readiness for Change Teaching method utilized: Visual & Auditory  Demonstrated degree of understanding via: Teach Back  Readiness Level: ready Barriers to learning/adherence to lifestyle change: nothing identified  RD's Notes for Next Visit Assess adherence to pt chosen goals  MONITORING & EVALUATION Dietary intake, weekly physical activity, body weight.  Next Steps Patient is to follow-up in 3 months for 6 month post-op follow-up.

## 2024-03-18 ENCOUNTER — Ambulatory Visit: Admitting: Family Medicine

## 2024-03-24 ENCOUNTER — Encounter: Admitting: Dietician

## 2024-04-14 ENCOUNTER — Ambulatory Visit: Admitting: Family Medicine

## 2024-04-15 ENCOUNTER — Ambulatory Visit: Admitting: Urology
